# Patient Record
Sex: Female | Born: 1995 | Race: Black or African American | Hispanic: No | Marital: Single | Smoking: Light tobacco smoker
Health system: Southern US, Community
[De-identification: ages and names within clinical notes are randomized; demographics above are authoritative.]

## PROBLEM LIST (undated history)

## (undated) DIAGNOSIS — F419 Anxiety disorder, unspecified: Secondary | ICD-10-CM

---
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
## 2017-01-24 ENCOUNTER — Other Ambulatory Visit: Payer: Self-pay

## 2017-01-24 ENCOUNTER — Encounter (HOSPITAL_BASED_OUTPATIENT_CLINIC_OR_DEPARTMENT_OTHER): Payer: Self-pay | Admitting: *Deleted

## 2017-01-24 ENCOUNTER — Emergency Department (HOSPITAL_BASED_OUTPATIENT_CLINIC_OR_DEPARTMENT_OTHER): Payer: Self-pay

## 2017-01-24 ENCOUNTER — Emergency Department (HOSPITAL_BASED_OUTPATIENT_CLINIC_OR_DEPARTMENT_OTHER)
Admission: EM | Admit: 2017-01-24 | Discharge: 2017-01-25 | Disposition: A | Discharge status: Home / Self Care | Payer: Self-pay | Attending: Emergency Medicine | Admitting: Emergency Medicine

## 2017-01-24 DIAGNOSIS — Z79899 Other long term (current) drug therapy: Secondary | ICD-10-CM | POA: Insufficient documentation

## 2017-01-24 DIAGNOSIS — J45909 Unspecified asthma, uncomplicated: Secondary | ICD-10-CM | POA: Insufficient documentation

## 2017-01-24 DIAGNOSIS — Y929 Unspecified place or not applicable: Secondary | ICD-10-CM | POA: Insufficient documentation

## 2017-01-24 DIAGNOSIS — S5011XA Contusion of right forearm, initial encounter: Secondary | ICD-10-CM | POA: Insufficient documentation

## 2017-01-24 DIAGNOSIS — W010XXA Fall on same level from slipping, tripping and stumbling without subsequent striking against object, initial encounter: Secondary | ICD-10-CM | POA: Insufficient documentation

## 2017-01-24 DIAGNOSIS — Y999 Unspecified external cause status: Secondary | ICD-10-CM | POA: Insufficient documentation

## 2017-01-24 DIAGNOSIS — Y939 Activity, unspecified: Secondary | ICD-10-CM | POA: Insufficient documentation

## 2017-01-24 NOTE — ED Triage Notes (Signed)
She slipped on water and fell yesterday am. Injury to her right forearm.

## 2017-01-25 NOTE — Discharge Instructions (Signed)
Wear wrist splint as applied for the next several days for comfort.  Tylenol 1000 mg rotated with ibuprofen 600 mg every 4 hours as needed for pain.  MRI outpatient as scheduled.  Please follow-up these results with your primary doctor.

## 2017-01-25 NOTE — ED Provider Notes (Signed)
[facility] EMERGENCY DEPARTMENT Provider Note   CSN: [id number] Arrival date & time: 01/24/17  2254     History   Chief Complaint Chief Complaint  Patient presents with  . Arm Injury    HPI Renee [name] is a 21 y.o. female.  Patient is a 21 year old female presenting for evaluation of a fall.  She reports slipping on a wet floor 2 days ago injuring her right forearm.   The history is provided by the patient.  Arm Injury   This is a new problem. The current episode started 2 days ago. The problem occurs constantly. The problem has not changed since onset.The quality of the pain is described as sharp. The pain is moderate. Pertinent negatives include no numbness and no tingling. She has tried nothing for the symptoms.    Past Medical History:  Diagnosis Date  . Asthma     Patient Active Problem List   Diagnosis Date Noted  . Asthma with exacerbation 04/26/2014  . Heart palpitations 12/15/2013  . Blurred vision, bilateral 12/15/2013  . Decreased visual acuity 12/15/2013  . Generalized anxiety disorder 12/15/2013  . Tension headache 12/15/2013  . Encounter for sexually transmitted disease counseling 12/29/2012  . General counseling and advice on female contraception 12/29/2012  . Paresthesias 08/05/2012  . Tongue lesion 08/05/2012  . ASTHMA, PERSISTENT 06/22/2008  . CHILDHOOD OBESITY 08/27/2007  . H/O Kawasaki's disease 05/28/2007  . ALLERGIC RHINITIS 06/20/2006    History reviewed. No pertinent surgical history.  OB History    No data available       Home Medications    Prior to Admission medications   Medication Sig Start Date End Date Taking? Authorizing Provider  albuterol (PROAIR HFA) 108 (90 Base) MCG/ACT inhaler INHALE 2 PUFFS INTO THE LUNGS EVERY 4 (FOUR) HOURS AS NEEDED FOR WHEEZING OR SHORTNESS OF BREATH. 11/06/15   Rumley,  N, DO  b complex vitamins capsule Take 1 capsule by mouth daily. 08/04/12   [place] Peaches[place]az,  Donetta Pottsayarmys, MD  beclomethasone (QVAR) 40 MCG/ACT inhaler Inhale 2 puffs into the lungs 2 (two) times daily. 10/22/10 10/22/11  Shelva MajesticHunter, Stephen O, MD  cetirizine-pseudoephedrine (ZYRTEC-D ALLERGY & CONGESTION) 5-120 MG per tablet Take 1 tablet by mouth 2 (two) times daily. For allergies     [provider]  cyclobenzaprine (FLEXERIL) 5 MG tablet Take 1 tablet (5 mg total) by mouth 2 (two) times daily as needed for muscle spasms. 12/11/14   Danelle Berryapia, Leisa, PA-C  fexofenadine (ALLEGRA) 180 MG tablet Take 1 tablet (180 mg total) by mouth daily. 07/02/11 07/01/12  Floydene FlockNewton, Steven J, MD  fluticasone (FLONASE) 50 MCG/ACT nasal spray Place 1 spray into the nose 2 (two) times daily as needed for rhinitis. 2 sprays each nostril daily. 07/02/11   Floydene FlockNewton, Steven J, MD  guaiFENesin (MUCINEX) 600 MG 12 hr tablet Take 1 tablet (600 mg total) by mouth 2 (two) times daily. 03/03/12   Tommie Samsook, Jayce G, DO  ibuprofen (ADVIL,MOTRIN) 200 MG tablet Take 400 mg by mouth every 6 (six) hours as needed for headache, mild pain or moderate pain.    [provider]  ibuprofen (ADVIL,MOTRIN) 800 MG tablet Take 1 tablet (800 mg total) by mouth 3 (three) times daily. 12/11/14   Danelle Berryapia, Leisa, PA-C  predniSONE (DELTASONE) 20 MG tablet Take 2 tablets (40 mg total) by mouth daily with breakfast. 04/26/14   Elenora GammaBradshaw, Samuel L, MD  venlafaxine XR (EFFEXOR XR) 37.5 MG 24 hr capsule Take 37.5 mg  once a day for 7 days, then take 75 mg 12/15/13   Kuneff, Renee A, DO    Family History No family history on file.  Social History Social History   Tobacco Use  . Smoking status: Never Smoker  . Smokeless tobacco: Never Used  Substance Use Topics  . Alcohol use: No  . Drug use: No     Allergies   Patient has no known allergies.   Review of Systems Review of Systems  Neurological: Negative for tingling and numbness.  All other systems reviewed and are negative.    Physical Exam Updated Vital Signs BP 119/80   Pulse 100    Temp 98.4 F (36.9 C) (Oral)   Resp 18   Ht 5\' 2"  (1.575 m)   Wt 102.1 kg (225 lb)   LMP 01/01/2017   SpO2 100%   BMI 41.15 kg/m   Physical Exam  Constitutional: She is oriented to person, place, and time. She appears well-developed and well-nourished. No distress.  HENT:  Head: Normocephalic and atraumatic.  Neck: Normal range of motion. Neck supple.  Musculoskeletal:  The right forearm appears grossly normal.  There is no swelling or deformity.  There is tenderness over the mid and distal third of the forearm.  Ulnar and radial pulses are palpable.  She is able to flex and extend all fingers and wrist.  Capillary refill is brisk and sensation is intact throughout the entire hand.  Neurological: She is alert and oriented to person, place, and time.  Skin: Skin is warm and dry. She is not diaphoretic.  Nursing note and vitals reviewed.    ED Treatments / Results  Labs (all labs ordered are listed, but only abnormal results are displayed) Labs Reviewed - No data to display  EKG  EKG Interpretation None       Radiology Dg Forearm Right  Result Date: 01/24/2017 CLINICAL DATA:  Status post fall, with right distal forearm pain. EXAM: RIGHT FOREARM - 2 VIEW COMPARISON:  None. FINDINGS: There is a focal cortical defect along the radial aspect of the distal radial diaphysis, with underlying lucency along the inner table of the cortex. This is of uncertain significance, and raises concern for an underlying lesion. There is no evidence of fracture or dislocation. No significant soft tissue abnormalities are characterized on radiograph. The carpal rows appear grossly intact, and demonstrate normal alignment. The elbow joint is unremarkable. No elbow joint effusion is identified. IMPRESSION: 1. No evidence of fracture or dislocation. 2. Focal cortical defect along the radial aspect of the distal radial diaphysis, with underlying lucency along the anterior cortex. This is of uncertain  significance, and raises concern for underlying lesion. MRI of the right distal forearm is recommended for further evaluation. Electronically Signed   By: Roanna Raider M.D.   On: 01/24/2017 23:59    Procedures Procedures (including critical care time)  Medications Ordered in ED Medications - No data to display   Initial Impression / Assessment and Plan / ED Course  I have reviewed the triage vital signs and the nursing notes.  Pertinent labs & imaging results that were available during my care of the patient were reviewed by me and considered in my medical decision making (see chart for details).  Patient will be placed in a splint and treated with ibuprofen and Tylenol.  This appears to be a contusion with x-rays negative for fracture.  Her x-rays do reveal a bone lesion, the etiology of which I am uncertain.  Radiology is recommending an MRI be performed to further investigate.  This will be scheduled as an outpatient and followed up with her primary doctor.  Final Clinical Impressions(s) / ED Diagnoses   Final diagnoses:  None    ED Discharge Orders        Ordered    MR FOREARM RIGHT WO CONTRAST     01/25/17 0013       Geoffery Lyonselo, Anjana Cheek, MD 01/25/17 [id number]

## 2017-01-30 ENCOUNTER — Ambulatory Visit (HOSPITAL_COMMUNITY)
Admission: RE | Admit: 2017-01-30 | Discharge: 2017-01-30 | Disposition: A | Discharge status: Home / Self Care | Payer: Self-pay | Source: Ambulatory Visit | Attending: Emergency Medicine | Admitting: Emergency Medicine

## 2017-01-30 DIAGNOSIS — M79631 Pain in right forearm: Secondary | ICD-10-CM | POA: Insufficient documentation

## 2017-01-31 ENCOUNTER — Ambulatory Visit (HOSPITAL_COMMUNITY): Payer: Self-pay

## 2017-02-05 ENCOUNTER — Telehealth: Payer: Self-pay | Admitting: *Deleted

## 2017-02-05 NOTE — Telephone Encounter (Signed)
Received request for Medical Records from [facility]; forwarded to Jordan for email/scan/SLS 01/09    

## 2017-08-04 ENCOUNTER — Ambulatory Visit: Payer: BC Managed Care – PPO | Admitting: Family Medicine

## 2017-08-20 ENCOUNTER — Emergency Department (HOSPITAL_COMMUNITY)
Admission: EM | Admit: 2017-08-20 | Discharge: 2017-08-20 | Discharge status: Against Medical Advice | Payer: BC Managed Care – PPO | Attending: Emergency Medicine | Admitting: Emergency Medicine

## 2017-08-20 ENCOUNTER — Inpatient Hospital Stay (HOSPITAL_COMMUNITY)
Admission: AD | Admit: 2017-08-20 | Payer: Federal, State, Local not specified - Other | Source: Intra-hospital | Admitting: Psychiatry

## 2017-08-20 ENCOUNTER — Encounter (HOSPITAL_COMMUNITY): Payer: Self-pay | Admitting: *Deleted

## 2017-08-20 DIAGNOSIS — Z79899 Other long term (current) drug therapy: Secondary | ICD-10-CM | POA: Insufficient documentation

## 2017-08-20 DIAGNOSIS — J45909 Unspecified asthma, uncomplicated: Secondary | ICD-10-CM | POA: Insufficient documentation

## 2017-08-20 DIAGNOSIS — F332 Major depressive disorder, recurrent severe without psychotic features: Secondary | ICD-10-CM | POA: Diagnosis present

## 2017-08-20 DIAGNOSIS — F329 Major depressive disorder, single episode, unspecified: Secondary | ICD-10-CM

## 2017-08-20 DIAGNOSIS — F32A Depression, unspecified: Secondary | ICD-10-CM

## 2017-08-20 HISTORY — DX: Anxiety disorder, unspecified: F41.9

## 2017-08-20 LAB — COMPREHENSIVE METABOLIC PANEL
ALK PHOS: 87 U/L (ref 38–126)
ALT: 12 U/L (ref 0–44)
AST: 15 U/L (ref 15–41)
Albumin: 3.6 g/dL (ref 3.5–5.0)
Anion gap: 6 (ref 5–15)
BILIRUBIN TOTAL: 0.5 mg/dL (ref 0.3–1.2)
BUN: 10 mg/dL (ref 6–20)
CALCIUM: 9 mg/dL (ref 8.9–10.3)
CO2: 26 mmol/L (ref 22–32)
Chloride: 110 mmol/L (ref 98–111)
Creatinine, Ser: 0.67 mg/dL (ref 0.44–1.00)
GFR calc non Af Amer: >60 mL/min (ref >60)
Glucose, Bld: 121 mg/dL — ABNORMAL HIGH (ref 70–99)
POTASSIUM: 4.4 mmol/L (ref 3.5–5.1)
SODIUM: 142 mmol/L (ref 135–145)
TOTAL PROTEIN: 8.1 g/dL (ref 6.5–8.1)

## 2017-08-20 LAB — CBC WITH DIFFERENTIAL/PLATELET
Basophils Absolute: 0 10*3/uL (ref 0.0–0.1)
Basophils Relative: 0 %
Eosinophils Absolute: 0.1 10*3/uL (ref 0.0–0.7)
Eosinophils Relative: 1 %
HEMATOCRIT: 37.8 % (ref 36.0–46.0)
Hemoglobin: 12.8 g/dL (ref 12.0–15.0)
LYMPHS PCT: 43 %
Lymphs Abs: 3.3 10*3/uL (ref 0.7–4.0)
MCH: 29.4 pg (ref 26.0–34.0)
MCHC: 33.9 g/dL (ref 30.0–36.0)
MCV: 86.9 fL (ref 78.0–100.0)
MONOS PCT: 5 %
Monocytes Absolute: 0.4 10*3/uL (ref 0.1–1.0)
Neutro Abs: 3.9 10*3/uL (ref 1.7–7.7)
Neutrophils Relative %: 51 %
Platelets: 337 10*3/uL (ref 150–400)
RBC: 4.35 MIL/uL (ref 3.87–5.11)
RDW: 12.2 % (ref 11.5–15.5)
WBC: 7.6 10*3/uL (ref 4.0–10.5)

## 2017-08-20 LAB — RAPID URINE DRUG SCREEN, HOSP PERFORMED
Amphetamines: NOT DETECTED
BENZODIAZEPINES: NOT DETECTED
Barbiturates: NOT DETECTED
Cocaine: NOT DETECTED
OPIATES: NOT DETECTED
Tetrahydrocannabinol: NOT DETECTED

## 2017-08-20 LAB — I-STAT BETA HCG BLOOD, ED (MC, WL, AP ONLY): I-stat hCG, quantitative: <5 m[IU]/mL (ref <5)

## 2017-08-20 LAB — TSH: TSH: 3.424 u[IU]/mL (ref 0.350–4.500)

## 2017-08-20 LAB — ETHANOL: Alcohol, Ethyl (B): <10 mg/dL (ref <10)

## 2017-08-20 NOTE — ED Triage Notes (Signed)
Pt reports depressed mood for ~1.5 years. Pt mother states the pt is talking about her depressed mood more lately. Pt's mother is concerned the pt will hurt herself, pt denies wanting to hurt herself.

## 2017-08-20 NOTE — ED Provider Notes (Signed)
[facility]-EMERGENCY DEPT Provider Note   CSN: [id number] Arrival date & time: 08/20/17  1124     History   Chief Complaint Chief Complaint  Patient presents with  . Depression    HPI Renee [name] is a 22 y.o. female.  She is brought in by her mom for increased depressive symptoms.  She has not had a true diagnosis of depression is not on any medications for it.  She was treated by her PCP for anxiety about 5 months ago but the medications gave her headaches.  She states she is under a lot of stress with school.  She finds her sleeping pattern is been very disrupted and she cannot seem to relax and does not take part of anything that used to interest her in the past.  She denies any suicidal ideation homicidal ideation or any hallucinations or delusions.  She has never tried to hurt herself in the past.  She denies any alcohol or substance use.  The history is provided by the patient and a parent.  Depression  This is a new problem. The problem occurs constantly. The problem has not changed since onset.Pertinent negatives include no chest pain, no abdominal pain, no headaches and no shortness of breath. Exacerbated by: stress. Nothing relieves the symptoms. She has tried nothing for the symptoms. The treatment provided no relief.    Past Medical History:  Diagnosis Date  . Asthma     Patient Active Problem List   Diagnosis Date Noted  . Asthma with exacerbation 04/26/2014  . Heart palpitations 12/15/2013  . Blurred vision, bilateral 12/15/2013  . Decreased visual acuity 12/15/2013  . Generalized anxiety disorder 12/15/2013  . Tension headache 12/15/2013  . Encounter for sexually transmitted disease counseling 12/29/2012  . General counseling and advice on female contraception 12/29/2012  . Paresthesias 08/05/2012  . Tongue lesion 08/05/2012  . ASTHMA, PERSISTENT 06/22/2008  . CHILDHOOD OBESITY 08/27/2007  . H/O Kawasaki's disease 05/28/2007  .  ALLERGIC RHINITIS 06/20/2006    History reviewed. No pertinent surgical history.   OB History   None      Home Medications    Prior to Admission medications   Medication Sig Start Date End Date Taking? Authorizing Provider  albuterol (PROAIR HFA) 108 (90 Base) MCG/ACT inhaler INHALE 2 PUFFS INTO THE LUNGS EVERY 4 (FOUR) HOURS AS NEEDED FOR WHEEZING OR SHORTNESS OF BREATH. 11/06/15   Rumley, Starbuck N, DO  b complex vitamins capsule Take 1 capsule by mouth daily. 08/04/12   [place] Cris[place]da PeachesLa Paz, Donetta Pottsayarmys, MD  beclomethasone (QVAR) 40 MCG/ACT inhaler Inhale 2 puffs into the lungs 2 (two) times daily. 10/22/10 10/22/11  Shelva MajesticHunter, Stephen O, MD  cetirizine-pseudoephedrine (ZYRTEC-D ALLERGY & CONGESTION) 5-120 MG per tablet Take 1 tablet by mouth 2 (two) times daily. For allergies     [provider]  cyclobenzaprine (FLEXERIL) 5 MG tablet Take 1 tablet (5 mg total) by mouth 2 (two) times daily as needed for muscle spasms. 12/11/14   Danelle Berryapia, Leisa, PA-C  fexofenadine (ALLEGRA) 180 MG tablet Take 1 tablet (180 mg total) by mouth daily. 07/02/11 07/01/12  Floydene FlockNewton, Steven J, MD  fluticasone (FLONASE) 50 MCG/ACT nasal spray Place 1 spray into the nose 2 (two) times daily as needed for rhinitis. 2 sprays each nostril daily. 07/02/11   Floydene FlockNewton, Steven J, MD  guaiFENesin (MUCINEX) 600 MG 12 hr tablet Take 1 tablet (600 mg total) by mouth 2 (two) times daily. 03/03/12   Tommie Samsook, Jayce G,  DO  ibuprofen (ADVIL,MOTRIN) 200 MG tablet Take 400 mg by mouth every 6 (six) hours as needed for headache, mild pain or moderate pain.    [provider]  ibuprofen (ADVIL,MOTRIN) 800 MG tablet Take 1 tablet (800 mg total) by mouth 3 (three) times daily. 12/11/14   Danelle Berry, PA-C  predniSONE (DELTASONE) 20 MG tablet Take 2 tablets (40 mg total) by mouth daily with breakfast. 04/26/14   Elenora Gamma, MD  venlafaxine XR (EFFEXOR XR) 37.5 MG 24 hr capsule Take 37.5 mg once a day for 7 days, then take 75 mg 12/15/13    Kuneff, Renee A, DO    Family History No family history on file.  Social History Social History   Tobacco Use  . Smoking status: Never Smoker  . Smokeless tobacco: Never Used  Substance Use Topics  . Alcohol use: No  . Drug use: No     Allergies   Patient has no known allergies.   Review of Systems Review of Systems  Constitutional: Negative for fever.  HENT: Negative for sore throat.   Eyes: Negative for visual disturbance.  Respiratory: Negative for shortness of breath.   Cardiovascular: Negative for chest pain.  Gastrointestinal: Negative for abdominal pain.  Genitourinary: Negative for dysuria.  Skin: Negative for rash.  Neurological: Negative for headaches.  Psychiatric/Behavioral: Positive for depression and dysphoric mood. Negative for self-injury and suicidal ideas.     Physical Exam Updated Vital Signs BP 108/71 (BP Location: Left Arm)   Pulse 89   Temp 98.3 F (36.8 C) (Oral)   Resp 18   LMP 07/21/2017   SpO2 97%   Physical Exam  Constitutional: She appears well-developed and well-nourished.  HENT:  Head: Normocephalic and atraumatic.  Eyes: Conjunctivae are normal.  Neck: Neck supple.  Cardiovascular: Normal rate and regular rhythm.  Pulmonary/Chest: Effort normal. No stridor. She has no wheezes. She has no rales.  Abdominal: Soft. There is no tenderness. There is no guarding.  Musculoskeletal: Normal range of motion.  Neurological: She is alert. GCS eye subscore is 4. GCS verbal subscore is 5. GCS motor subscore is 6.  Skin: Skin is warm and dry.  Psychiatric: Her speech is normal and behavior is normal. Thought content normal. She exhibits a depressed mood.  Nursing note and vitals reviewed.    ED Treatments / Results  Labs (all labs ordered are listed, but only abnormal results are displayed) Labs Reviewed  COMPREHENSIVE METABOLIC PANEL - Abnormal; Notable for the following components:      Result Value   Glucose, Bld 121 (*)    All  other components within normal limits  ETHANOL  RAPID URINE DRUG SCREEN, HOSP PERFORMED  CBC WITH DIFFERENTIAL/PLATELET  TSH  I-STAT BETA HCG BLOOD, ED ([facility], [facility], [facility] ONLY)    EKG None  Radiology No results found.  Procedures Procedures (including critical care time)  Medications Ordered in ED Medications - No data to display   Initial Impression / Assessment and Plan / ED Course  I have reviewed the triage vital signs and the nursing notes.  Pertinent labs & imaging results that were available during my care of the patient were reviewed by me and considered in my medical decision making (see chart for details).  Clinical Course as of Aug 21 1732  Wed Aug 20, 2017  1508 Patient has been evaluated and no acute medical condition identified on a screening exam.  Discussed with behavioral health who plans on admitting her to behavioral  health when a bed is available at 8 PM.   [MB]  1529 Informed by nurse that the patient may have eloped with her family.   [MB]  1537 Palpation at number listed for home phone and there was no answer and no answering machine.  Attempted to reach mother with contact number listed in demographics and the person who answered said he has been getting phone calls for her for 3 years and this is not her number.   [MB]  1617 I was asked by behavioral health if I thought the patient would be considered for an IVC.  During my interaction with the patient and the mom there was no suicidal ideation and so I do not feel an IVC would be appropriate.  We are still trying to reach the patient to see if she would return to the hospital.   [MB]    Clinical Course User Index [MB] Terrilee Files, MD    Final Clinical Impressions(s) / ED Diagnoses   Final diagnoses:  Depression, unspecified depression type  Severe episode of recurrent major depressive disorder, without psychotic features [facility])    ED Discharge Orders    None       Terrilee Files,  MD 08/21/17 1735

## 2017-08-20 NOTE — ED Notes (Signed)
Bed: [facility]PT3 Expected date:  Expected time:  Means of arrival:  Comments: 

## 2017-08-20 NOTE — ED Notes (Signed)
Pt was not in room, [facility].

## 2017-08-20 NOTE — ED Notes (Signed)
Lab draw unsuccessful. RN made aware, TTS at bedside.

## 2017-08-20 NOTE — BH Assessment (Signed)
Assessment Note  Renee [name] is a 22 y.o. female who presented to [facility] on voluntary basis with complaint of depressed mood, passive suicidal ideation, and other depressive symptoms.  Pt was accompanied by her brother and mother.  Pt lives in [place] with her mother.  Per report, she attends college and also works.  She has never received any outpatient or inpatient psychiatric/therapy care.  Pt has not been assessed by TTS before.  History was provided by Pt and Pt's mother.  Pt reported that she has experienced the following symptoms over the last two months:  Passive suicidal ideation (no Despondency; poor sleep (5 hours per night); lowered appetite (lost 15 lbs over two months); feelings of worthlessness and hopelessness; fatigue.  Pt also endorsed recent stressor -- she recently lost a job and may lose another because she is lethargic and avolitional.  Pt denied past suicide attempt, homicidal ideation, hallucination, self-injurious behavior, and substance use.  Pt appeared apathetic and lethargic.  Her responses were ambiguous.  Chartered loss adjuster spoke with mother separately.  Mother reported that she is increasingly concerned about Pt because Pt cut her wrist last week (superficial cut, no sutures/stitches required), because Pt is endorsing passive suicidal ideation, and because Pt appears to be responding to internal stimuli.  Mother requested inpatient treatment or an overnight observation.  Pt denied trauma, reported that she felt safe at home, and denied current stressors.  ''I don't know... I don't feel good or like doing anything."   During assessment, Pt presented as alert and oriented.  Pt was dressed in street clothes, and she appeared appropriately groomed.  Pt's demeanor was guarded.  Pt's mood was sad.  Affect was blunted.  Pt endorsed passive suicidal ideation, despondency, and other depressive symptoms.  Pt's mother reported that Pt engages in self harm and responds to internal stimuli.   Pt's speech was soft.  Thought processes were within normal range.  There was no evidence of delusion.  There was no evidence of hallucination or substance use.  Pt's insight, judgment, and impulse control was fair.  Consulted with Molli Knock, NP, who determined that Pt meets inpatient criteria.   Diagnosis: F32. Major Depressive Disorder, Single Ep., Severe w/o psychotic features  Past Medical History:  Past Medical History:  Diagnosis Date  . Anxiety   . Asthma     History reviewed. No pertinent surgical history.  Family History: No family history on file.  Social History:  reports that she has never smoked. She has never used smokeless tobacco. She reports that she does not drink alcohol or use drugs.  Additional Social History:  Alcohol / Drug Use Pain Medications: See MAR Prescriptions: See MAR Over the Counter: See MAR History of alcohol / drug use?: No history of alcohol / drug abuse  CIWA: CIWA-Ar BP: 108/71 Pulse Rate: 89 COWS:    Allergies: No Known Allergies  Home Medications:  (Not in a hospital admission)  OB/GYN Status:  Patient's last menstrual period was 07/21/2017.  General Assessment Data TTS Assessment: In system Is this a Tele or Face-to-Face Assessment?: Face-to-Face Is this an Initial Assessment or a Re-assessment for this encounter?: Initial Assessment Marital status: Single Maiden name: Gren Is patient pregnant?: No Pregnancy Status: No Living Arrangements: Parent(Lives iwth mother) Can pt return to current living arrangement?: Yes Admission Status: Voluntary Is patient capable of signing voluntary admission?: Yes Referral Source: Self/Family/Friend Insurance type: BCBS     Crisis Care Plan Living Arrangements: Parent(Lives iwth mother) Name of Psychiatrist:  None Name of Therapist: None  Education Status Is patient currently in school?: Yes Current Grade: Some college  Risk to self with the past 6 months Suicidal Ideation:  No(See notes -- some passive SI) Has patient been a risk to self within the past 6 months prior to admission? : No Suicidal Intent: No Has patient had any suicidal intent within the past 6 months prior to admission? : No Is patient at risk for suicide?: No(See notes) Suicidal Plan?: No Has patient had any suicidal plan within the past 6 months prior to admission? : No Access to Means: No What has been your use of drugs/alcohol within the last 12 months?: Pt denied Previous Attempts/Gestures: No Intentional Self Injurious Behavior: Cutting(Per mother, seee notes) Comment - Self Injurious Behavior: Cut on wrist from last week Family Suicide History: No Recent stressful life event(s): Job Loss Persecutory voices/beliefs?: No Depression: Yes Depression Symptoms: Despondent, Insomnia, Isolating, Feeling worthless/self pity, Loss of interest in usual pleasures Substance abuse history and/or treatment for substance abuse?: No Suicide prevention information given to non-admitted patients: Not applicable  Risk to Others within the past 6 months Homicidal Ideation: No Does patient have any lifetime risk of violence toward others beyond the six months prior to admission? : No Thoughts of Harm to Others: No Current Homicidal Intent: No Current Homicidal Plan: No Access to Homicidal Means: No History of harm to others?: No Assessment of Violence: None Noted Does patient have access to weapons?: No Criminal Charges Pending?: No Does patient have a court date: No Is patient on probation?: No  Psychosis Hallucinations: None noted(Pt denied, but see  notes) Delusions: None noted  Mental Status Report Appearance/Hygiene: In scrubs, Other (Comment)(Street clothes) Eye Contact: Fair Motor Activity: Freedom of movement, Unremarkable Speech: Logical/coherent, Soft Level of Consciousness: Alert Mood: Sad Affect: Blunted Anxiety Level: None Thought Processes: Relevant, Coherent Judgement:  Partial Orientation: Person, Place, Time, Situation Obsessive Compulsive Thoughts/Behaviors: None  Cognitive Functioning Concentration: Normal Memory: Recent Intact, Remote Intact Is patient IDD: No Is patient DD?: No Insight: Fair Impulse Control: Fair Appetite: Fair Have you had any weight changes? : Loss Amount of the weight change? (lbs): 15 lbs(Over two month period) Sleep: Decreased Total Hours of Sleep: 5 Vegetative Symptoms: None  ADLScreening [facility] Assessment Services) Patient's cognitive ability adequate to safely complete daily activities?: Yes Patient able to express need for assistance with ADLs?: Yes Independently performs ADLs?: Yes (appropriate for developmental age)  Prior Inpatient Therapy Prior Inpatient Therapy: No  Prior Outpatient Therapy Prior Outpatient Therapy: No Does patient have an ACCT team?: No Does patient have Intensive In-House Services?  : No Does patient have [facility] services? : No Does patient have P4CC services?: No  ADL Screening (condition at time of admission) Patient's cognitive ability adequate to safely complete daily activities?: Yes Is the patient deaf or have difficulty hearing?: No Does the patient have difficulty seeing, even when wearing glasses/contacts?: No Does the patient have difficulty concentrating, remembering, or making decisions?: No Patient able to express need for assistance with ADLs?: Yes Does the patient have difficulty dressing or bathing?: No Independently performs ADLs?: Yes (appropriate for developmental age) Does the patient have difficulty walking or climbing stairs?: No Weakness of Legs: None Weakness of Arms/Hands: None  Home Assistive Devices/Equipment Home Assistive Devices/Equipment: None  Therapy Consults (therapy consults require a physician order) PT Evaluation Needed: No OT Evalulation Needed: No SLP Evaluation Needed: No Abuse/Neglect Assessment (Assessment to be complete while patient  is alone) Abuse/Neglect Assessment  Can Be Completed: Yes Physical Abuse: Denies Verbal Abuse: Denies Sexual Abuse: Denies Exploitation of patient/patient's resources: Denies Self-Neglect: Denies Values / Beliefs Cultural Requests During Hospitalization: None Spiritual Requests During Hospitalization: None Consults Spiritual Care Consult Needed: No Social Work Consult Needed: No Merchant navy officerAdvance Directives (For Healthcare) Does Patient Have a Medical Advance Directive?: No Would patient like information on creating a medical advance directive?: No - Patient declined    Additional Information 1:1 In Past 12 Months?: No CIRT Risk: No Elopement Risk: No Does patient have medical clearance?: Yes     Disposition:  Disposition Initial Assessment Completed for this Encounter: Yes Disposition of Patient: Admit Type of inpatient treatment program: Adult(Per Molli KnockJ. Lord, NP, Pt meets inpt criteria)  On Site Evaluation by:   Reviewed with Physician:    Dorris FetchEugene T Greysyn Vanderberg 08/20/2017 2:25 PM

## 2017-08-20 NOTE — ED Notes (Signed)
Bed: [facility]4 Expected date:  Expected time:  Means of arrival:  Comments: 

## 2017-08-21 ENCOUNTER — Ambulatory Visit (INDEPENDENT_AMBULATORY_CARE_PROVIDER_SITE_OTHER): Payer: Self-pay | Admitting: Family Medicine

## 2017-08-21 ENCOUNTER — Encounter: Payer: Self-pay | Admitting: Family Medicine

## 2017-08-21 DIAGNOSIS — F329 Major depressive disorder, single episode, unspecified: Secondary | ICD-10-CM

## 2017-08-21 DIAGNOSIS — F32A Depression, unspecified: Secondary | ICD-10-CM

## 2017-08-21 DIAGNOSIS — F332 Major depressive disorder, recurrent severe without psychotic features: Secondary | ICD-10-CM

## 2017-08-21 MED ORDER — CITALOPRAM HYDROBROMIDE 20 MG PO TABS: 20.0000 mg | ORAL_TABLET | Freq: Every day | ORAL | 3 refills | Status: DC

## 2017-08-21 NOTE — Patient Instructions (Signed)
See me in 3 -4 weeks to recheck to recheck. I will introduce you to one of our counselors. Remember that exercise and getting out with friends can help the depression go away more quickly.

## 2017-08-22 ENCOUNTER — Encounter: Payer: Self-pay | Admitting: Family Medicine

## 2017-08-22 NOTE — Assessment & Plan Note (Signed)
Did not want counseling today but I did make a warm introduction to integrated care and encouraged counseling.  Start celexa.  FU 3-4 weeks with me.  To integrated care sooner.

## 2017-08-22 NOTE — Progress Notes (Signed)
   Subjective:    Patient ID: Renee [name], female    DOB: 03/03/1995, 22 y.o.   MRN: [id number]  HPI Patient comes in with her mom to discuss significant depression.  Present for 2 months.  No obvious precipitating cause.  States this is the first episode (but has been on venlafexamine in the past for anxiety.)  No SI or HI.  Classic vegetative signs with difficulty sleeping then fatigue during the day.  Anhedonia - does not want to do things she previously found to be fun.  Increased appetite.    Family Hx is somewhat positive.  Maternal grandmother had vague "nerve problems"  Mother does not have depression.     Review of Systems     Objective:   Physical Exam  Engages nicely in the office - mom states this is unusual.  Per mom and patient, this may be because they know me from treating her previous Kawasaki's disease.  Duration of visit was 25 minutes  >50 % counseling.        Assessment & Plan:

## 2017-08-27 ENCOUNTER — Other Ambulatory Visit: Payer: Self-pay

## 2017-08-27 ENCOUNTER — Telehealth: Payer: Self-pay | Admitting: Licensed Clinical Social Worker

## 2017-08-27 ENCOUNTER — Emergency Department (HOSPITAL_COMMUNITY)
Admission: EM | Admit: 2017-08-27 | Discharge: 2017-08-28 | Disposition: A | Discharge status: Other Acute Inpatient Hospital | Payer: Self-pay | Attending: Emergency Medicine | Admitting: Emergency Medicine

## 2017-08-27 ENCOUNTER — Encounter (HOSPITAL_COMMUNITY): Payer: Self-pay | Admitting: *Deleted

## 2017-08-27 ENCOUNTER — Ambulatory Visit: Payer: Self-pay

## 2017-08-27 DIAGNOSIS — F333 Major depressive disorder, recurrent, severe with psychotic symptoms: Secondary | ICD-10-CM | POA: Insufficient documentation

## 2017-08-27 DIAGNOSIS — Z046 Encounter for general psychiatric examination, requested by authority: Secondary | ICD-10-CM

## 2017-08-27 DIAGNOSIS — J45909 Unspecified asthma, uncomplicated: Secondary | ICD-10-CM | POA: Insufficient documentation

## 2017-08-27 DIAGNOSIS — F259 Schizoaffective disorder, unspecified: Secondary | ICD-10-CM | POA: Diagnosis present

## 2017-08-27 LAB — COMPREHENSIVE METABOLIC PANEL
ALK PHOS: 97 U/L (ref 38–126)
ALT: 13 U/L (ref 0–44)
AST: 22 U/L (ref 15–41)
Albumin: 3.9 g/dL (ref 3.5–5.0)
Anion gap: 11 (ref 5–15)
BUN: 9 mg/dL (ref 6–20)
CALCIUM: 9.2 mg/dL (ref 8.9–10.3)
CO2: 23 mmol/L (ref 22–32)
CREATININE: 0.76 mg/dL (ref 0.44–1.00)
Chloride: 108 mmol/L (ref 98–111)
Glucose, Bld: 105 mg/dL — ABNORMAL HIGH (ref 70–99)
Potassium: 3.3 mmol/L — ABNORMAL LOW (ref 3.5–5.1)
Sodium: 142 mmol/L (ref 135–145)
Total Bilirubin: 0.5 mg/dL (ref 0.3–1.2)
Total Protein: 8.4 g/dL — ABNORMAL HIGH (ref 6.5–8.1)

## 2017-08-27 LAB — I-STAT BETA HCG BLOOD, ED (MC, WL, AP ONLY): I-stat hCG, quantitative: <5 m[IU]/mL (ref <5)

## 2017-08-27 LAB — CBC
HCT: 39.3 % (ref 36.0–46.0)
Hemoglobin: 13.7 g/dL (ref 12.0–15.0)
MCH: 29.5 pg (ref 26.0–34.0)
MCHC: 34.9 g/dL (ref 30.0–36.0)
MCV: 84.5 fL (ref 78.0–100.0)
PLATELETS: 386 10*3/uL (ref 150–400)
RBC: 4.65 MIL/uL (ref 3.87–5.11)
RDW: 12.1 % (ref 11.5–15.5)
WBC: 7.9 10*3/uL (ref 4.0–10.5)

## 2017-08-27 LAB — ETHANOL: Alcohol, Ethyl (B): <10 mg/dL (ref <10)

## 2017-08-27 MED ORDER — HYDROXYZINE HCL 25 MG PO TABS: 50.0000 mg | ORAL_TABLET | Freq: Four times a day (QID) | ORAL | Status: DC | PRN

## 2017-08-27 MED ORDER — HALOPERIDOL 5 MG PO TABS
5.0000 mg | ORAL_TABLET | Freq: Four times a day (QID) | ORAL | Status: DC | PRN
  Filled 2017-08-27: qty 1

## 2017-08-27 MED ORDER — HALOPERIDOL LACTATE 5 MG/ML IJ SOLN: 5.0000 mg | Freq: Four times a day (QID) | INTRAMUSCULAR | Status: DC | PRN

## 2017-08-27 MED ORDER — LORAZEPAM 1 MG PO TABS: 2.0000 mg | ORAL_TABLET | Freq: Four times a day (QID) | ORAL | Status: DC | PRN

## 2017-08-27 MED ORDER — TRAZODONE HCL 50 MG PO TABS: 50.0000 mg | ORAL_TABLET | Freq: Every evening | ORAL | Status: DC | PRN

## 2017-08-27 MED ORDER — DIPHENHYDRAMINE HCL 50 MG/ML IJ SOLN: 50.0000 mg | Freq: Four times a day (QID) | INTRAMUSCULAR | Status: DC | PRN

## 2017-08-27 MED ORDER — LORAZEPAM 2 MG/ML IJ SOLN: 2.0000 mg | Freq: Four times a day (QID) | INTRAMUSCULAR | Status: DC | PRN
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
## 2017-08-27 NOTE — ED Notes (Signed)
Belongings placed in bag. Pt has been wanded by security. Security escorted pt to room 37 with belongings.
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
## 2017-08-27 NOTE — ED Notes (Signed)
Bed: [facility]4 Expected date:  Expected time:  Means of arrival:  Comments: 

## 2017-08-27 NOTE — ED Triage Notes (Signed)
Pt brought in escorted by [employer] and presents with IVC papers.  Per the IVC papers, pt has hx of depression and has been non-compliant with medications. PT has been having hallucinations and talking to two different people as witnessed by family.  Pt appears to be responding to the hallucination's commands.  [employer] reports that pt has been cooperative with them and denies SI/HI.

## 2017-08-28 ENCOUNTER — Encounter (HOSPITAL_COMMUNITY): Payer: Self-pay

## 2017-08-28 ENCOUNTER — Inpatient Hospital Stay (HOSPITAL_COMMUNITY)
Admission: AD | Admit: 2017-08-28 | Discharge: 2017-09-03 | DRG: 885 | Disposition: A | Discharge status: Home / Self Care | Payer: Federal, State, Local not specified - Other | Source: Intra-hospital | Attending: Psychiatry | Admitting: Psychiatry

## 2017-08-28 DIAGNOSIS — R45851 Suicidal ideations: Secondary | ICD-10-CM | POA: Diagnosis present

## 2017-08-28 DIAGNOSIS — F12159 Cannabis abuse with psychotic disorder, unspecified: Secondary | ICD-10-CM | POA: Diagnosis present

## 2017-08-28 DIAGNOSIS — G4700 Insomnia, unspecified: Secondary | ICD-10-CM | POA: Diagnosis present

## 2017-08-28 DIAGNOSIS — F19959 Other psychoactive substance use, unspecified with psychoactive substance-induced psychotic disorder, unspecified: Secondary | ICD-10-CM

## 2017-08-28 DIAGNOSIS — F1210 Cannabis abuse, uncomplicated: Secondary | ICD-10-CM

## 2017-08-28 DIAGNOSIS — G4710 Hypersomnia, unspecified: Secondary | ICD-10-CM | POA: Diagnosis present

## 2017-08-28 DIAGNOSIS — R634 Abnormal weight loss: Secondary | ICD-10-CM | POA: Diagnosis present

## 2017-08-28 DIAGNOSIS — F419 Anxiety disorder, unspecified: Secondary | ICD-10-CM | POA: Diagnosis present

## 2017-08-28 DIAGNOSIS — F333 Major depressive disorder, recurrent, severe with psychotic symptoms: Principal | ICD-10-CM | POA: Diagnosis present

## 2017-08-28 DIAGNOSIS — R41843 Psychomotor deficit: Secondary | ICD-10-CM | POA: Diagnosis present

## 2017-08-28 DIAGNOSIS — Z915 Personal history of self-harm: Secondary | ICD-10-CM

## 2017-08-28 DIAGNOSIS — Z79899 Other long term (current) drug therapy: Secondary | ICD-10-CM | POA: Diagnosis not present

## 2017-08-28 DIAGNOSIS — F259 Schizoaffective disorder, unspecified: Secondary | ICD-10-CM | POA: Diagnosis present

## 2017-08-28 DIAGNOSIS — Z8739 Personal history of other diseases of the musculoskeletal system and connective tissue: Secondary | ICD-10-CM

## 2017-08-28 DIAGNOSIS — R4584 Anhedonia: Secondary | ICD-10-CM | POA: Diagnosis present

## 2017-08-28 DIAGNOSIS — J45909 Unspecified asthma, uncomplicated: Secondary | ICD-10-CM | POA: Diagnosis present

## 2017-08-28 DIAGNOSIS — Z046 Encounter for general psychiatric examination, requested by authority: Secondary | ICD-10-CM

## 2017-08-28 LAB — RAPID URINE DRUG SCREEN, HOSP PERFORMED
Amphetamines: NOT DETECTED
BARBITURATES: NOT DETECTED
BENZODIAZEPINES: NOT DETECTED
COCAINE: NOT DETECTED
Opiates: NOT DETECTED
TETRAHYDROCANNABINOL: POSITIVE — AB

## 2017-08-28 MED ORDER — MAGNESIUM HYDROXIDE 400 MG/5ML PO SUSP: 30.0000 mL | Freq: Every day | ORAL | Status: DC | PRN

## 2017-08-28 MED ORDER — ALUM & MAG HYDROXIDE-SIMETH 200-200-20 MG/5ML PO SUSP: 30.0000 mL | ORAL | Status: DC | PRN

## 2017-08-28 MED ORDER — ACETAMINOPHEN 325 MG PO TABS
650.0000 mg | ORAL_TABLET | Freq: Four times a day (QID) | ORAL | Status: DC | PRN
  Administered 2017-08-29: 650 mg via ORAL
  Filled 2017-08-28: qty 2

## 2017-08-28 MED ORDER — CITALOPRAM HYDROBROMIDE 20 MG PO TABS
20.0000 mg | ORAL_TABLET | Freq: Every day | ORAL | Status: DC
  Administered 2017-08-29 – 2017-08-30 (×2): 20 mg via ORAL
  Filled 2017-08-28 (×3): qty 1

## 2017-08-28 MED ORDER — ALBUTEROL SULFATE HFA 108 (90 BASE) MCG/ACT IN AERS
2.0000 | INHALATION_SPRAY | Freq: Four times a day (QID) | RESPIRATORY_TRACT | Status: DC
  Administered 2017-08-29 – 2017-09-01 (×4): 2 via RESPIRATORY_TRACT
  Filled 2017-08-28 (×2): qty 6.7

## 2017-08-28 MED ORDER — HYDROXYZINE HCL 50 MG PO TABS
50.0000 mg | ORAL_TABLET | Freq: Four times a day (QID) | ORAL | Status: DC | PRN
  Administered 2017-08-28 – 2017-08-29 (×2): 50 mg via ORAL
  Filled 2017-08-28 (×2): qty 1
  Filled 2017-08-28: qty 10

## 2017-08-28 MED ORDER — TRAZODONE HCL 50 MG PO TABS
50.0000 mg | ORAL_TABLET | Freq: Every evening | ORAL | Status: DC | PRN
  Administered 2017-08-28 – 2017-08-29 (×2): 50 mg via ORAL
  Filled 2017-08-28 (×2): qty 1

## 2017-08-28 MED ORDER — LORAZEPAM 1 MG PO TABS: 2.0000 mg | ORAL_TABLET | Freq: Four times a day (QID) | ORAL | Status: DC | PRN

## 2017-08-28 MED ORDER — HALOPERIDOL LACTATE 5 MG/ML IJ SOLN: 5.0000 mg | Freq: Four times a day (QID) | INTRAMUSCULAR | Status: DC | PRN

## 2017-08-28 MED ORDER — LORAZEPAM 2 MG/ML IJ SOLN: 2.0000 mg | Freq: Four times a day (QID) | INTRAMUSCULAR | Status: DC | PRN

## 2017-08-28 NOTE — ED Notes (Signed)
Pt observed talking to self in hallway and in the bathroom. RN notified.

## 2017-08-28 NOTE — Consult Note (Addendum)
[facility] Consult   Reason for Consult:  Aggressive behavior Referring Physician:  EDP Patient Identification: Renee [name] MRN:  [id number] Principal Diagnosis: Schizoaffective disorder [facility]) Diagnosis:   Patient Active Problem List   Diagnosis Date Noted  . Involuntary commitment [Z04.6]   . Schizoaffective disorder ([place]) [F25.9]   . Depression [F32.9] 08/21/2017  . Severe episode of recurrent major depressive disorder, without psychotic features ([place]) [F33.2] 08/20/2017  . Asthma with exacerbation [J45.901] 04/26/2014  . Heart palpitations [R00.2] 12/15/2013  . Blurred vision, bilateral [H53.8] 12/15/2013  . Decreased visual acuity [H54.7] 12/15/2013  . Generalized anxiety disorder [F41.1] 12/15/2013  . Tension headache [G44.209] 12/15/2013  . Encounter for sexually transmitted disease counseling [Z70.8] 12/29/2012  . General counseling and advice on female contraception [Z30.09] 12/29/2012  . Paresthesias [R20.2] 08/05/2012  . Tongue lesion [K14.8] 08/05/2012  . ASTHMA, PERSISTENT [J45.909] 06/22/2008  . CHILDHOOD OBESITY [E66.9] 08/27/2007  . 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 ALLERGIC RHINITIS [J30.9] 06/20/2006    Total Time spent with patient: 45 minutes  Subjective:   Renee [name] is a 22 y.o. female patient admitted with hallucinations and aggressive behavior.  HPI:  Pt was seen and chart reviewed with treatment team and Dr Mariea Clonts. Pt denies suicidal/homicidal ideation, denies auditory/visual hallucinations but does appear to be responding to internal stimuli. Pt stated she doesn't know why her mother took out IVC papers out on her and that her mother is just trying to block her father. Pt had no reliable resource that we could speak with to verify that she was not acting aggressive and that she was not trying to access weapons in the trunk of her mother's car. Pt stated she was working at [employer] and [employer] but quit due to  being too stressful but she stated she wanted to attend college to study biology and work in a lab somewhere. Pt's UDS positive for THC and BAL negative. Pt was in the ED on 08-20-17 for a similar presentation and a bed was offered at [facility]. When TTS went to inform Pt that she would be going to the hospital, she had left with her family. At that time the EDP did not feel it was warranted to find Pt and have her IVC'd. Pt's labwork is unremarkable, K+ is at 3.3 which is barely below normal. No other diagnostic tests were ordered during this admission. Pt would benefit form an inpatient psychiatric admission for crisis stabilization and medication management.   Past Psychiatric History: As above  Risk to Self: Suicidal Ideation: No(Pt denies. ) Suicidal Intent: No Is patient at risk for suicide?: No Suicidal Plan?: No(Pt denies. ) Access to Means: No What has been your use of drugs/alcohol within the last 12 months?: Marijuana. UDS is pending.  How many times?: 0 Other Self Harm Risks: Pt denies.  Triggers for Past Attempts: None known Intentional Self Injurious Behavior: None Comment - Self Injurious Behavior: NA Risk to Others: Homicidal Ideation: No(Pt denies. ) Thoughts of Harm to Others: No(Pt denies. ) Current Homicidal Intent: No(Pt denies. ) Current Homicidal Plan: No Access to Homicidal Means: No Identified Victim: NA History of harm to others?: No Assessment of Violence: None Noted Violent Behavior Description: NA Does patient have access to weapons?: No(Pt denies. ) Criminal Charges Pending?: No Does patient have a court date: No Prior Inpatient Therapy:   Prior Outpatient Therapy: Prior Outpatient Therapy: No Does patient have an ACCT team?: No Does patient have Intensive In-House Services?  :  No Does patient have [facility] services? : No Does patient have P4CC services?: No  Past Medical History:  Past Medical History:  Diagnosis Date  . Anxiety   . Asthma    History  reviewed. No pertinent surgical history. Family History: No family history on file. Family Psychiatric  History: Unknown Social History:  Social History   Substance and Sexual Activity  Alcohol Use No     Social History   Substance and Sexual Activity  Drug Use No    Social History   Socioeconomic History  . Marital status: Single    Spouse name: Not on file  . Number of children: Not on file  . Years of education: Not on file  . Highest education level: Not on file  Occupational History  . Not on file  Social Needs  . Financial resource strain: Not on file  . Food insecurity:    Worry: Not on file    Inability: Not on file  . Transportation needs:    Medical: Not on file    Non-medical: Not on file  Tobacco Use  . Smoking status: Never Smoker  . Smokeless tobacco: Never Used  Substance and Sexual Activity  . Alcohol use: No  . Drug use: No  . Sexual activity: Never  Lifestyle  . Physical activity:    Days per week: Not on file    Minutes per session: Not on file  . Stress: Not on file  Relationships  . Social connections:    Talks on phone: Not on file    Gets together: Not on file    Attends religious service: Not on file    Active member of club or organization: Not on file    Attends meetings of clubs or organizations: Not on file    Relationship status: Not on file  Other Topics Concern  . Not on file  Social History Narrative  . Not on file   Additional Social History: N/A    Allergies:  No Known Allergies  Labs:  Results for orders placed or performed during the hospital encounter of 08/27/17 (from the past 48 hour(s))  Comprehensive metabolic panel     Status: Abnormal   Collection Time: 08/27/17  7:21 PM  Result Value Ref Range   Sodium 142 135 - 145 mmol/L   Potassium 3.3 (L) 3.5 - 5.1 mmol/L   Chloride 108 98 - 111 mmol/L   CO2 23 22 - 32 mmol/L   Glucose, Bld 105 (H) 70 - 99 mg/dL   BUN 9 6 - 20 mg/dL   Creatinine, Ser 0.76 0.44 -  1.00 mg/dL   Calcium 9.2 8.9 - 10.3 mg/dL   Total Protein 8.4 (H) 6.5 - 8.1 g/dL   Albumin 3.9 3.5 - 5.0 g/dL   AST 22 15 - 41 U/L   ALT 13 0 - 44 U/L   Alkaline Phosphatase 97 38 - 126 U/L   Total Bilirubin 0.5 0.3 - 1.2 mg/dL   GFR calc non Af Amer >60 >60 mL/min   GFR calc Af Amer >60 >60 mL/min    Comment: (NOTE) The eGFR has been calculated using the CKD EPI equation. This calculation has not been validated in all clinical situations. eGFR's persistently <60 mL/min signify possible Chronic Kidney Disease.    Anion gap 11 5 - 15    Comment: Performed at [facility], [place] [address], [place], [place] [address]  Ethanol     Status: None  Collection Time: 08/27/17  7:21 PM  Result Value Ref Range   Alcohol, Ethyl (B) <10 <10 mg/dL    Comment: (NOTE) Lowest detectable limit for serum alcohol is 10 mg/dL. For medical purposes only. Performed at [facility], [place] [address], [place], [place] [address]   cbc     Status: None   Collection Time: 08/27/17  7:21 PM  Result Value Ref Range   WBC 7.9 4.0 - 10.5 K/uL   RBC 4.65 3.87 - 5.11 MIL/uL   Hemoglobin 13.7 12.0 - 15.0 g/dL   HCT 39.3 36.0 - 46.0 %   MCV 84.5 78.0 - 100.0 fL   MCH 29.5 26.0 - 34.0 pg   MCHC 34.9 30.0 - 36.0 g/dL   RDW 12.1 11.5 - 15.5 %   Platelets 386 150 - 400 K/uL    Comment: Performed at [facility], [place] [address] [place], [place] [address]  I-Stat beta hCG blood, ED     Status: None   Collection Time: 08/27/17  7:24 PM  Result Value Ref Range   I-stat hCG, quantitative <5.0 <5 mIU/mL   Comment 3            Comment:   GEST. AGE      CONC.  (mIU/mL)   <=1 WEEK        5 - 50     2 WEEKS       50 - 500     3 WEEKS       100 - 10,000     4 WEEKS     1,000 - 30,000        FEMALE AND NON-PREGNANT FEMALE:     LESS THAN 5 mIU/mL   Rapid urine drug screen (hospital performed)     Status: Abnormal   Collection Time: 08/27/17 11:45 PM  Result  Value Ref Range   Opiates NONE DETECTED NONE DETECTED   Cocaine NONE DETECTED NONE DETECTED   Benzodiazepines NONE DETECTED NONE DETECTED   Amphetamines NONE DETECTED NONE DETECTED   Tetrahydrocannabinol POSITIVE (A) NONE DETECTED   Barbiturates NONE DETECTED NONE DETECTED    Comment: (NOTE) DRUG SCREEN FOR MEDICAL PURPOSES ONLY.  IF CONFIRMATION IS NEEDED FOR ANY PURPOSE, NOTIFY LAB WITHIN 5 DAYS. LOWEST DETECTABLE LIMITS FOR URINE DRUG SCREEN Drug Class                     Cutoff (ng/mL) Amphetamine and metabolites    1000 Barbiturate and metabolites    200 Benzodiazepine                 607 Tricyclics and metabolites     300 Opiates and metabolites        300 Cocaine and metabolites        300 THC                            50 Performed at [facility], [place] [address], [place],  [address]     Current Facility-Administered Medications  Medication Dose Route Frequency Provider Last Rate Last Dose  . diphenhydrAMINE (BENADRYL) injection 50 mg  50 mg Intramuscular Q6H PRN Mortis, Gabrielle I, PA-C      . haloperidol (HALDOL) tablet 5 mg  5 mg Oral Q6H PRN Mortis, Gabrielle I, PA-C      . haloperidol lactate (HALDOL) injection 5 mg  5 mg Intramuscular Q6H PRN Mortis,  Jonelle Sports, PA-C      . hydrOXYzine (ATARAX/VISTARIL) tablet 50 mg  50 mg Oral Q6H PRN Mortis, Alvie Heidelberg I, PA-C      . LORazepam (ATIVAN) injection 2 mg  2 mg Intramuscular Q6H PRN Mortis, Alvie Heidelberg I, PA-C      . LORazepam (ATIVAN) tablet 2 mg  2 mg Oral Q6H PRN Mortis, Gabrielle I, PA-C      . traZODone (DESYREL) tablet 50 mg  50 mg Oral QHS PRN Mortis, Jonelle Sports, PA-C       Current Outpatient Medications  Medication Sig Dispense Refill  . citalopram (CELEXA) 20 MG tablet Take 1 tablet (20 mg total) by mouth daily. 30 tablet 3  . albuterol (PROAIR HFA) 108 (90 Base) MCG/ACT inhaler INHALE 2 PUFFS INTO THE LUNGS EVERY 4 (FOUR) HOURS AS NEEDED FOR WHEEZING OR SHORTNESS OF BREATH. 8.5  Inhaler 3    Musculoskeletal: Strength & Muscle Tone: within normal limits Gait & Station: normal Patient leans: N/A  Psychiatric Specialty Exam: Physical Exam  Nursing note and vitals reviewed. Constitutional: She is oriented to person, place, and time. She appears well-developed and well-nourished.  HENT:  Head: Normocephalic and atraumatic.  Neck: Normal range of motion.  Musculoskeletal: Normal range of motion.  Neurological: She is alert and oriented to person, place, and time.  Psychiatric: Her speech is normal and behavior is normal. Thought content is paranoid and delusional. Cognition and memory are normal. She expresses impulsivity. She exhibits a depressed mood.    Review of Systems  Psychiatric/Behavioral: Positive for depression and substance abuse. Negative for hallucinations, memory loss and suicidal ideas. The patient is not nervous/anxious and does not have insomnia.   All other systems reviewed and are negative.   Blood pressure 104/64, pulse 77, temperature 98 F (36.7 C), temperature source Oral, resp. rate 18, height '5\' 2"'  (1.575 m), weight 207 lb (93.9 kg), last menstrual period 08/25/2017, SpO2 100 %.Body mass index is 37.86 kg/m.  General Appearance: Casual  Eye Contact:  Good  Speech:  Clear and Coherent and Normal Rate  Volume:  Decreased  Mood:  Depressed  Affect:  Congruent and Depressed  Thought Process:  Linear and Descriptions of Associations: Intact  Orientation:  Full (Time, Place, and Person)  Thought Content:  Illogical  Suicidal Thoughts:  No  Homicidal Thoughts:  No  Memory:  Immediate;   Good Recent;   Fair Remote;   Fair  Judgement:  Impaired  Insight:  Lacking  Psychomotor Activity:  Normal  Concentration:  Concentration: Fair and Attention Span: Fair  Recall:  [employer] of Knowledge:  Good  Language:  Good  Akathisia:  No  Handed:  Right  AIMS (if indicated):   N/A  Assets:  Sales promotion account executive Housing  ADL's:  Intact  Cognition:  WNL  Sleep:   N/A     Treatment Plan Summary: Daily contact with patient to assess and evaluate symptoms and progress in treatment and Medication management (see MAR)  Disposition: Recommend psychiatric Inpatient admission when medically cleared. TTS to seek placement  Ethelene Hal, NP 08/28/2017 12:21 PM   Patient seen face-to-face for psychiatric evaluation, chart reviewed and case discussed with the physician extender and developed treatment plan. Reviewed the information documented and agree with the treatment plan.  Buford Dresser, DO 08/28/17 3:48 PM

## 2017-08-28 NOTE — ED Notes (Signed)
Pt is responding to internal stimuli.  She is good at hiding it and whenever she sees someone looking at her she stops speaking.  She denies AVH, S/I, and H/I.  15 minute checks and video monitoring in place.

## 2017-08-28 NOTE — ED Notes (Signed)
[facility] [employer], [id number].  Pending [employer] transport.

## 2017-08-28 NOTE — BH Assessment (Signed)
[facility]  Per Juanetta BeetsJacqueline Norman, DO, this pt requires psychiatric hospitalization.  Berneice Heinrichina Tate, RN, [facility]; [facility] will be ready to receive pt at 21:00.  Pt presents under IVC initiated by pt's mother, and upheld by Dr Sharma CovertNorman, and [facility].  Pt's nurse, Kendal Hymendie, has been notified, and agrees to call report to [phone].  Pt is to be transported via [employer].   Doylene Canninghomas Nikhil Osei, [place] Behavioral Health Coordinator [phone]

## 2017-08-28 NOTE — ED Notes (Signed)
Pt A&O x 3, no distress noted, watching TV at present.  Monitoring for safety, Q 15 min checks in effect.  [facility] [employer] [facility], pt is IVC.

## 2017-08-28 NOTE — ED Notes (Signed)
[employer] transport requested. 

## 2017-08-29 ENCOUNTER — Other Ambulatory Visit: Payer: Self-pay

## 2017-08-29 DIAGNOSIS — F333 Major depressive disorder, recurrent, severe with psychotic symptoms: Principal | ICD-10-CM

## 2017-08-29 DIAGNOSIS — F1210 Cannabis abuse, uncomplicated: Secondary | ICD-10-CM

## 2017-08-29 DIAGNOSIS — G4700 Insomnia, unspecified: Secondary | ICD-10-CM

## 2017-08-29 DIAGNOSIS — F19959 Other psychoactive substance use, unspecified with psychoactive substance-induced psychotic disorder, unspecified: Secondary | ICD-10-CM

## 2017-08-29 LAB — LIPID PANEL
CHOLESTEROL: 201 mg/dL — AB (ref 0–200)
HDL: 47 mg/dL (ref >40)
LDL Cholesterol: 135 mg/dL — ABNORMAL HIGH (ref 0–99)
TRIGLYCERIDES: 93 mg/dL (ref <150)
Total CHOL/HDL Ratio: 4.3 RATIO
VLDL: 19 mg/dL (ref 0–40)

## 2017-08-29 LAB — HEMOGLOBIN A1C
Hgb A1c MFr Bld: 4.8 % (ref 4.8–5.6)
MEAN PLASMA GLUCOSE: 91.06 mg/dL

## 2017-08-29 LAB — T4, FREE
FREE T4: 0.96 ng/dL (ref 0.82–1.77)
Free T4: 1 ng/dL (ref 0.82–1.77)

## 2017-08-29 LAB — TSH: TSH: 5.382 u[IU]/mL — ABNORMAL HIGH (ref 0.350–4.500)

## 2017-08-29 MED ORDER — ARIPIPRAZOLE 5 MG PO TABS
5.0000 mg | ORAL_TABLET | Freq: Every day | ORAL | Status: DC
  Administered 2017-08-29 – 2017-09-03 (×6): 5 mg via ORAL
  Filled 2017-08-29 (×6): qty 1
  Filled 2017-08-29: qty 7
  Filled 2017-08-29 (×2): qty 1

## 2017-08-29 MED ORDER — FLUOXETINE HCL 20 MG PO CAPS
40.0000 mg | ORAL_CAPSULE | Freq: Every day | ORAL | Status: DC
  Administered 2017-08-29 – 2017-09-03 (×6): 40 mg via ORAL
  Filled 2017-08-29 (×4): qty 2
  Filled 2017-08-29: qty 14
  Filled 2017-08-29 (×4): qty 2

## 2017-08-29 MED ORDER — MELATONIN 3 MG PO TABS
3.0000 mg | ORAL_TABLET | Freq: Every day | ORAL | Status: DC
  Administered 2017-08-29 – 2017-09-02 (×5): 3 mg via ORAL
  Filled 2017-08-29 (×7): qty 1

## 2017-08-29 NOTE — H&P (Signed)
Psychiatric Admission Assessment Adult  Patient Identification: Renee [name] MRN:  [id number] Date of Evaluation:  08/29/2017 Chief Complaint:  schizoaffective  Principal Diagnosis: Severe recurrent major depression with psychotic features [facility]) Diagnosis:   Patient Active Problem List   Diagnosis Date Noted  . 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 Marijuana abuse [F12.10] 08/29/2017  . Severe recurrent major depression with psychotic features (HCC) [F33.3] 08/28/2017  . Involuntary commitment [Z04.6]   . Schizoaffective disorder (HCC) [F25.9]   . Depression [F32.9] 08/21/2017  . Severe episode of recurrent major depressive disorder, without psychotic features (HCC) [F33.2] 08/20/2017  . Asthma with exacerbation [J45.901] 04/26/2014  . Heart palpitations [R00.2] 12/15/2013  . Blurred vision, bilateral [H53.8] 12/15/2013  . Decreased visual acuity [H54.7] 12/15/2013  . Generalized anxiety disorder [F41.1] 12/15/2013  . Tension headache [G44.209] 12/15/2013  . Encounter for sexually transmitted disease counseling [Z70.8] 12/29/2012  . General counseling and advice on female contraception [Z30.09] 12/29/2012  . Paresthesias [R20.2] 08/05/2012  . Tongue lesion [K14.8] 08/05/2012  . ASTHMA, PERSISTENT [J45.909] 06/22/2008  . CHILDHOOD OBESITY [E66.9] 08/27/2007  . 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 ALLERGIC RHINITIS [J30.9] 06/20/2006   Renee [name] is a 22 y.o. female who presented to [facility] on voluntary basis with complaint of depressed mood, passive suicidal ideation, and other depressive symptoms.  Pt was accompanied by her brother and mother.  Pt lives in [place] with her mother.  Per report, she attends college and also works.  She has never received any outpatient or inpatient psychiatric/therapy care.  Pt has not been assessed by TTS before.  History was provided by Pt and Pt's mother. Pt reported that she has experienced the  following symptoms over the last two months:  Passive suicidal ideation (no Despondency; poor sleep (5 hours per night); lowered appetite (lost 15 lbs over two months); feelings of worthlessness and hopelessness; fatigue.  Pt also endorsed recent stressor -- she recently lost a job and may lose another because she is lethargic and avolitional.  Pt denied past suicide attempt, homicidal ideation, hallucination, self-injurious behavior, and substance use.  Pt appeared apathetic and lethargic.  Her responses were ambiguous.  Chartered loss adjuster spoke with mother separately.  Mother reported that she is increasingly concerned about Pt because Pt cut her wrist last week (superficial cut, no sutures/stitches required), because Pt is endorsing passive suicidal ideation, and because Pt appears to be responding to internal stimuli.  Mother requested inpatient treatment or an overnight observation. Pt denied trauma, reported that she felt safe at home, and denied current stressors.  ''I don't know... I don't feel good or like doing anything."    On initial assessment, Pt presented as alert and oriented.  Pt was dressed in street clothes, and she appeared appropriately groomed.  Pt's demeanor was guarded.  Pt's mood was sad.  Affect was blunted. Pt endorsed passive suicidal ideation, despondency, and other depressive symptoms.  She reports that it is hard to get out of bed every day, and hard to go to sleep at night. She has not been going out of the home.  She specifically denies active suicidal ideation, plan or intent. She endorses self harm, and shows old scar on left forearm. She denies past suicide attempt. Patient endorses "using weed to manage stress, but denies regular use. She describes stressors as school (studies biology at [employer] and working at [employer], which was too fast paced). Patient denies AVH, and states that she was trying to get her water  bottle out of the trunk of the car, not guns.  Family has not been  available for collateral after multiple phone attempts.  Patient is agreeable to medication changes and after reviewing risk of marijuana use and psychosis/schizophrenia, she states she intends to abstain. Reports a good appetite and states last night she was resting well. Renee [name] denies any symptoms of delusional thoughts or paranoia, and does not appear to be responding to any internal stimuli. Patient is visible on the milieu.  Patient seen attending group sessions with active and engaged participation. Renee [name] has agreed to continue the current plan of care already in progress. She denies any other issues or concerns. Support encouragement reassurance was provided.  Associated Signs/Symptoms: Depression Symptoms:  depressed mood, anhedonia, insomnia, hypersomnia, psychomotor retardation, fatigue, feelings of worthlessness/guilt, difficulty concentrating, hopelessness, impaired memory, recurrent thoughts of death, suicidal thoughts without plan, anxiety, loss of energy/fatigue, disturbed sleep, weight loss, decreased appetite, (Hypo) Manic Symptoms:  denies Anxiety Symptoms:  denies Psychotic Symptoms:  Hallucinations: with marijuana use PTSD Symptoms: NA Total Time spent with patient: 55 min  Past Psychiatric History: depression, self harm/cutting  Is the patient at risk to self? Yes.    Has the patient been a risk to self in the past 6 months? Yes.    Has the patient been a risk to self within the distant past? Yes.    Is the patient a risk to others? Yes.    Has the patient been a risk to others in the past 6 months? Yes.    Has the patient been a risk to others within the distant past? Yes.     Prior Inpatient Therapy:   denies Prior Outpatient Therapy:  yes  Alcohol Screening: 1. How often do you have a drink containing alcohol?: Never 2. How many drinks containing alcohol do you have on a typical day when you are drinking?: 1 or 2 3. How often  do you have six or more drinks on one occasion?: Never AUDIT-C Score: 0 4. How often during the last year have you found that you were not able to stop drinking once you had started?: Never 5. How often during the last year have you failed to do what was normally expected from you becasue of drinking?: Never 6. How often during the last year have you needed a first drink in the morning to get yourself going after a heavy drinking session?: Never 7. How often during the last year have you had a feeling of guilt of remorse after drinking?: Never 8. How often during the last year have you been unable to remember what happened the night before because you had been drinking?: Never 9. Have you or someone else been injured as a result of your drinking?: No 10. Has a relative or friend or a doctor or another health worker been concerned about your drinking or suggested you cut down?: No Alcohol Use Disorder Identification Test Final Score (AUDIT): 0 Intervention/Follow-up: AUDIT Score <7 follow-up not indicated Substance Abuse History in the last 12 months:  Yes.   Consequences of Substance Abuse: Medical Consequences:  hospitalization Previous Psychotropic Medications: Yes  Psychological Evaluations: Yes  Past Medical History:  Past Medical History:  Diagnosis Date  . Anxiety   . Asthma    History reviewed. No pertinent surgical history. Family History: History reviewed. No pertinent family history. Family Psychiatric  History: denies  Tobacco Screening: Have you used any form of tobacco in the last 30  days? (Cigarettes, Smokeless Tobacco, Cigars, and/or Pipes): No Social History:  Social History   Substance and Sexual Activity  Alcohol Use No     Social History   Substance and Sexual Activity  Drug Use No    Additional Social History: Marital status: Single Are you sexually active?: No What is your sexual orientation?: Heterosexual Has your sexual activity been affected by drugs,  alcohol, medication, or emotional stress?: No Does patient have children?: No           Lives with mother and 20 year old brother.               Allergies:  No Known Allergies Lab Results:  Results for orders placed or performed during the hospital encounter of 08/28/17 (from the past 48 hour(s))  Hemoglobin A1c     Status: None   Collection Time: 08/29/17  6:30 AM  Result Value Ref Range   Hgb A1c MFr Bld 4.8 4.8 - 5.6 %    Comment: (NOTE) Pre diabetes:          5.7%-6.4% Diabetes:              >6.4% Glycemic control for   <7.0% adults with diabetes    Mean Plasma Glucose 91.06 mg/dL    Comment: Performed at [facility], [address] [place], [place] [address]  Lipid panel     Status: Abnormal   Collection Time: 08/29/17  6:30 AM  Result Value Ref Range   Cholesterol 201 (H) 0 - 200 mg/dL   Triglycerides 93 <604<150 mg/dL   HDL 47 >54>40 mg/dL   Total CHOL/HDL Ratio 4.3 RATIO   VLDL 19 0 - 40 mg/dL   LDL Cholesterol 098135 (H) 0 - 99 mg/dL    Comment:        Total Cholesterol/HDL:CHD Risk Coronary Heart Disease Risk Table                     Men   Women  1/2 Average Risk   3.4   3.3  Average Risk       5.0   4.4  2 X Average Risk   9.6   7.1  3 X Average Risk  23.4   11.0        Use the calculated Patient Ratio above and the CHD Risk Table to determine the patient's CHD Risk.        ATP III CLASSIFICATION (LDL):  <100     mg/dL   Optimal  119-147100-129  mg/dL   Near or Above                    Optimal  130-159  mg/dL   Borderline  [id number]  mg/dL   High  >130>190     mg/dL   Very High Performed at [facility], [address] [place], [place] [address]   TSH     Status: Abnormal   Collection Time: 08/29/17  6:30 AM  Result Value Ref Range   TSH 5.382 (H) 0.350 - 4.500 uIU/mL    Comment: Performed by a 3rd Generation assay with a functional sensitivity of <=0.01 uIU/mL. Performed at [facility], [address]  [place], [place] [address]     Blood Alcohol level:  Lab Results  Component Value Date   [facility] <10 08/27/2017   ETH <10 08/20/2017    Metabolic Disorder Labs:  Lab Results  Component Value  Date   HGBA1C 4.8 08/29/2017   MPG 91.06 08/29/2017   No results found for: PROLACTIN Lab Results  Component Value Date   CHOL 201 (H) 08/29/2017   TRIG 93 08/29/2017   HDL 47 08/29/2017   CHOLHDL 4.3 08/29/2017   VLDL 19 08/29/2017   LDLCALC 135 (H) 08/29/2017   LDLCALC 117 (H) 10/26/2010    Current Medications: Current Facility-Administered Medications  Medication Dose Route Frequency Provider Last Rate Last Dose  . acetaminophen (TYLENOL) tablet 650 mg  650 mg Oral Q6H PRN Nira Conn A, NP   650 mg at 08/29/17 0933  . albuterol (PROVENTIL HFA;VENTOLIN HFA) 108 (90 Base) MCG/ACT inhaler 2 puff  2 puff Inhalation Q6H Laveda Abbe, NP      . alum & mag hydroxide-simeth (MAALOX/MYLANTA) 200-200-20 MG/5ML suspension 30 mL  30 mL Oral Q4H PRN Nira Conn A, NP      . citalopram (CELEXA) tablet 20 mg  20 mg Oral Daily Laveda Abbe, NP   20 mg at 08/29/17 0751  . haloperidol lactate (HALDOL) injection 5 mg  5 mg Intramuscular Q6H PRN Laveda Abbe, NP      . hydrOXYzine (ATARAX/VISTARIL) tablet 50 mg  50 mg Oral Q6H PRN Laveda Abbe, NP   50 mg at 08/28/17 2237  . LORazepam (ATIVAN) injection 2 mg  2 mg Intramuscular Q6H PRN Laveda Abbe, NP      . LORazepam (ATIVAN) tablet 2 mg  2 mg Oral Q6H PRN Laveda Abbe, NP      . magnesium hydroxide (MILK OF MAGNESIA) suspension 30 mL  30 mL Oral Daily PRN Nira Conn A, NP      . traZODone (DESYREL) tablet 50 mg  50 mg Oral QHS PRN Laveda Abbe, NP   50 mg at 08/28/17 2237   PTA Medications: Medications Prior to Admission  Medication Sig Dispense Refill Last Dose  . albuterol (PROAIR HFA) 108 (90 Base) MCG/ACT inhaler INHALE 2 PUFFS INTO THE LUNGS EVERY 4 (FOUR) HOURS AS NEEDED FOR WHEEZING  OR SHORTNESS OF BREATH. 8.5 Inhaler 3 unknown  . citalopram (CELEXA) 20 MG tablet Take 1 tablet (20 mg total) by mouth daily. 30 tablet 3 Past Week at Unknown time    Musculoskeletal: Strength & Muscle Tone: within normal limits Gait & Station: normal Patient leans: N/A  Psychiatric Specialty Exam: Physical Exam  Nursing note and vitals reviewed. Constitutional: She is oriented to person, place, and time. She appears well-developed and well-nourished. No distress.  Musculoskeletal: Normal range of motion.  Neurological: She is alert and oriented to person, place, and time.  Psychiatric:  Depressed     Review of Systems  Constitutional: Negative.   Respiratory: Negative.   Cardiovascular: Negative.   Gastrointestinal: Negative.   Musculoskeletal: Negative.   Neurological: Negative.   Psychiatric/Behavioral: Positive for depression, substance abuse and suicidal ideas. Negative for hallucinations. The patient has insomnia. The patient is not nervous/anxious.     Blood pressure 110/72, pulse 94, temperature 97.9 F (36.6 C), temperature source Oral, resp. rate 16, height 5\' 2"  (1.575 m), weight 93.9 kg (207 lb), last menstrual period 08/25/2017.Body mass index is 37.86 kg/m.  General Appearance: Casual and Disheveled  Eye Contact:  Fair  Speech:  Clear and Coherent and Normal Rate  Volume:  Normal  Mood:  Depressed  Affect:  Congruent and Flat  Thought Process:  Linear  Orientation:  Full (Time, Place, and Person)  Thought Content:  Logical and  Hallucinations: denies  Suicidal Thoughts:  passive SI  Homicidal Thoughts:  No  Memory:  Immediate;   Fair Recent;   Fair Remote;   Fair  Judgement:  Poor  Insight:  Shallow  Psychomotor Activity:  Normal  Concentration:  Concentration: Fair and Attention Span: Fair  Recall:  [employer] of Knowledge:  Fair  Language:  Good  Akathisia:  No  AIMS (if indicated):     Assets:  Desire for Improvement Housing Social Support   ADL's:  Intact  Cognition:  WNL  Sleep:  Number of Hours: 6.25    Treatment Plan Summary: Daily contact with patient to assess and evaluate symptoms and progress in treatment and Medication management  Observation Level/Precautions:  15 minute checks  Laboratory:  CBC Chemistry Profile HbAIC UDS lipid; TSH elevated,  Have added free T4 and T3 to R/O hypothryroid at cause of depressive symptoms.  Psychotherapy:  Attend groups on unit  Medications:  Discontinue Celexa; start Prozac 40 mg QD, start Abilify 5 mg QD for depression augmentation and psychosis  Consultations:  n/a  Discharge Concerns:  Treatment compliance  Estimated LOS: 2-5 days  Other:  Substance use   Physician Treatment Plan for Primary Diagnosis: Severe recurrent major depression with psychotic features (HCC) Long Term Goal(s): Improvement in symptoms so as ready for discharge  Short Term Goals: Ability to identify changes in lifestyle to reduce recurrence of condition will improve, Ability to verbalize feelings will improve, Ability to disclose and discuss suicidal ideas, Ability to demonstrate self-control will improve, Ability to identify and develop effective coping behaviors will improve, Compliance with prescribed medications will improve and Ability to identify triggers associated with substance abuse/mental health issues will improve  Physician Treatment Plan for Secondary Diagnosis: Principal Problem:   Severe recurrent major depression with psychotic features (HCC) Active Problems:   Substance-induced psychotic disorder (HCC)   Marijuana abuse  Long Term Goal(s): Improvement in symptoms so as ready for discharge  Short Term Goals: Ability to identify changes in lifestyle to reduce recurrence of condition will improve, Ability to verbalize feelings will improve, Ability to disclose and discuss suicidal ideas, Ability to identify and develop effective coping behaviors will improve, Compliance with prescribed  medications will improve and Ability to identify triggers associated with substance abuse/mental health issues will improve  I certify that inpatient services furnished can reasonably be expected to improve the patient's condition.    Mariel Craft, MD 8/2/20193:04 PM

## 2017-08-29 NOTE — Plan of Care (Signed)
Nurse discussed depression, anxiety, coping skills with patient.  

## 2017-08-29 NOTE — Progress Notes (Signed)
Adult Psychoeducational Group Note  Date:  08/29/2017 Time:  9:07 AM  Group Topic/Focus:  Goals Group:   The focus of this group is to help patients establish daily goals to achieve during treatment and discuss how the patient can incorporate goal setting into their daily lives to aide in recovery.  Participation Level:  Active  Participation Quality:  Appropriate  Affect:  Appropriate  Cognitive:  Appropriate  Insight: Appropriate  Engagement in Group:  Engaged  Modes of Intervention:  Discussion  Additional Comments:  Pt has a goal of meeting with the doctor for the first time today.   Deforest Hoyleslexandre J Vernelle Wisner 08/29/2017, 9:07 AM

## 2017-08-29 NOTE — Progress Notes (Signed)
The focus of this group is to help patients review their daily goal of treatment and discuss progress on daily workbooks. Pt did not attend the evening group. 

## 2017-08-29 NOTE — Progress Notes (Addendum)
Patient ID: Renee [name], female   DOB: 07/18/1995, 22 y.o.   MRN: [id number]  Admission Note:  [age] yr female who presents IVC in no acute distress for the treatment of Psychosis and non compliance with medications. Pt appears flat, irritable and depressed. Pt was calm and cooperative with admission process. Pt not very cooperative at this time so pt does not forward much information. Pt irritable. Pt stated there was nothing wrong with her and she did not need to be here. Pt did appear to be responding to internal stimuli before writer walked in , so during the admission process pt had very little to say to writer at this time.   A: Skin was assessed (Kim-RN) and found to be clear of any abnormal marks apart from a scar on L-forearm. PT searched and no contraband found, POC and unit policies explained and understanding verbalized. Consents obtained. Food and fluids offered, and  Accepted.  R: Pt had no additional questions or concerns.

## 2017-08-29 NOTE — BHH Counselor (Signed)
Adult Comprehensive Assessment  Patient ID: Renee [name], female   DOB: 07/24/1995, 22 y.o.   MRN: [id number]  Information Source: Information source: Patient  Current Stressors:  Patient states their primary concerns and needs for treatment are:: Patient stated she has personal issues, school, lquit job ast month. Patient states their goals for this hospitilization and ongoing recovery are:: Patient stated she wants to get out of here and start attending [employer] majoring in [employer] and get her life back. Educational / Learning stressors: Patient wants to attend  [employer]. Employment / Job issues: Patient quit her job last month after working there for a couple of weeks. She stated the job wasn't really for her. Family Relationships: Patient denies. Financial / Lack of resources (include bankruptcy): Patient denies. Housing / Lack of housing: [place] denies, Physical health (include injuries & life threatening diseases): Patient denies. Social relationships: Patient denies. Substance abuse: Patient denies. Bereavement / Loss: Patient denies.  Living/Environment/Situation:  Living Arrangements: Parent, Other relatives Living conditions (as described by patient or guardian): Patient stated living conditions are adequate in the home. Who else lives in the home?: Patient lives in the home with her mother and brother. How long has patient lived in current situation?: Patient stated she and her family have lived in their current residence for about a year. What is atmosphere in current home: Comfortable, Loving  Family History:  Marital status: Single Are you sexually active?: No What is your sexual orientation?: Heterosexual Has your sexual activity been affected by drugs, alcohol, medication, or emotional stress?: No Does patient have children?: No  Childhood History:  By whom was/is the patient raised?: Both parents Additional childhood history information: Patient was raised  primarily by her mother but her father would get her sometimes. Description of patient's relationship with caregiver when they were a child: Patient stated she had a good relationship with her mother. Patient stated she had a good relationship with her father. Patient's description of current relationship with people who raised him/her: Patient stated she has a good relationship with her mother and father. How were you disciplined when you got in trouble as a child/adolescent?: Patient stated she received punishment such as phone being taken away. She received a spanking sometimes. Does patient have siblings?: Yes Number of Siblings: 2(Patient has one 20 yo brother; father and his girlfriend are expecting a baby.) Description of patient's current relationship with siblings: Patient stated she has a good relationship with her brother. Did patient suffer any verbal/emotional/physical/sexual abuse as a child?: No Did patient suffer from severe childhood neglect?: No Has patient ever been sexually abused/assaulted/raped as an adolescent or adult?: No Was the patient ever a victim of a crime or a disaster?: No Witnessed domestic violence?: No Has patient been effected by domestic violence as an adult?: No  Education:  Highest grade of school patient has completed: 11th Currently a student?: No Learning disability?: No  Employment/Work Situation:   Employment situation: Unemployed Patient's job has been impacted by current illness: No What is the longest time patient has a held a job?: For about 2 weeks. Where was the patient employed at that time?: [employer] Did You Receive Any Psychiatric Treatment/Services While in the Military?: No Are There Guns or Other Weapons in Your Home?: No  Financial Resources:   Financial resources: Support from parents / caregiver Does patient have a Lawyerrepresentative payee or guardian?: No  Alcohol/Substance Abuse:   What has been your use of drugs/alcohol  within the last  12 months?: Patient stated she has smoked weed once. If attempted suicide, did drugs/alcohol play a role in this?: No Alcohol/Substance Abuse Treatment Hx: Denies past history Has alcohol/substance abuse ever caused legal problems?: No  Social Support System:   Patient's Community Support System: Good Describe Community Support System: Mother, brother Type of faith/religion: Christianity/Baptist How does patient's faith help to cope with current illness?: Go to church more and get out of the house more.  Leisure/Recreation:   Leisure and Hobbies: Chll out, read  Strengths/Needs:   What is the patient's perception of their strengths?: Patient is good in biology, drawing Patient states they can use these personal strengths during their treatment to contribute to their recovery: Patient stated she could draw more and help her get back to school. Patient states these barriers may affect/interfere with their treatment: Patient identified no barriers Patient states these barriers may affect their return to the community: Patient identified no barriers. Other important information patient would like considered in planning for their treatment: Patient stated she was involuntarily committed and she wants to be voluntary.  Discharge Plan:   Currently receiving community mental health services: No Patient states concerns and preferences for aftercare planning are: Patient stated she has no concerns or preferences. Patient states they will know when they are safe and ready for discharge when: Patient stated she doesn't know, she is ready now and she does't really feel like anything is wrong with her.  Does patient have access to transportation?: Yes Does patient have financial barriers related to discharge medications?: No Will patient be returning to same living situation after discharge?: Yes  Summary/Recommendations:   Summary and Recommendations (to be completed by the  evaluator): Renee [name] is an 22 y.o. female, who presents involuntary and unaccompanied to [facility]. As clinician entered the pt's room she observed the pt talking to herself and laughing. Clinician asked the pt, "what brought you to the hospital?" Pt reported, "court order, the magistrate." Pt reported, "they think I'm gonna hurt my family, my mom lied." Pt reported her mother removed all the weapons from the house." Pt reported, she was going into the trunk to get her water bottle, her mother stopped her and she was taken to the hospital. Pt reported, she wanted to go for a walk. Pt reported, her mother has a gun and they have kitchen knives. Pt denies, SI, HI, AVH, self-injurious behaviors and access to weapons.   Recommendations:?Patient will benefit from crisis stabilization, medication evaluation, group therapy and psychoeducation, in addition to case management for discharge planning. At discharge it is recommended that Patient adhere to the established discharge plan and continue in treatment.  However, patient refused recommendations for therapy and psychiatric med management. She stated she currently receives treatment at her PCP [facility]) and prefers to continue.  Anticipated Outcomes:?Mood will be stabilized, crisis will be stabilized, medications will be established if appropriate, coping skills will be taught and practiced, family session will be done to determine discharge plan, mental illness will be normalized, patient will be better equipped to recognize symptoms and ask for assistance.  Roselyn Bering, MSW, LCSW Clinical Social Work Roselyn Bering. 08/29/2017

## 2017-08-29 NOTE — Tx Team (Signed)
Interdisciplinary Treatment and Diagnostic Plan Update  08/29/2017 Time of Session: 0830AM Renee [name] MRN: [id number]  Principal Diagnosis: MDD, recurrent, severe with psychotic features  Secondary Diagnoses: Active Problems:   Severe recurrent major depression with psychotic features (HCC)   Current Medications:  Current Facility-Administered Medications  Medication Dose Route Frequency Provider Last Rate Last Dose  . acetaminophen (TYLENOL) tablet 650 mg  650 mg Oral Q6H PRN Lindon Romp A, NP   650 mg at 08/29/17 0933  . albuterol (PROVENTIL HFA;VENTOLIN HFA) 108 (90 Base) MCG/ACT inhaler 2 puff  2 puff Inhalation Q6H Ethelene Hal, NP      . alum & mag hydroxide-simeth (MAALOX/MYLANTA) 200-200-20 MG/5ML suspension 30 mL  30 mL Oral Q4H PRN Lindon Romp A, NP      . citalopram (CELEXA) tablet 20 mg  20 mg Oral Daily Ethelene Hal, NP   20 mg at 08/29/17 0751  . haloperidol lactate (HALDOL) injection 5 mg  5 mg Intramuscular Q6H PRN Ethelene Hal, NP      . hydrOXYzine (ATARAX/VISTARIL) tablet 50 mg  50 mg Oral Q6H PRN Ethelene Hal, NP   50 mg at 08/28/17 2237  . LORazepam (ATIVAN) injection 2 mg  2 mg Intramuscular Q6H PRN Ethelene Hal, NP      . LORazepam (ATIVAN) tablet 2 mg  2 mg Oral Q6H PRN Ethelene Hal, NP      . magnesium hydroxide (MILK OF MAGNESIA) suspension 30 mL  30 mL Oral Daily PRN Lindon Romp A, NP      . traZODone (DESYREL) tablet 50 mg  50 mg Oral QHS PRN Ethelene Hal, NP   50 mg at 08/28/17 2237   PTA Medications: Medications Prior to Admission  Medication Sig Dispense Refill Last Dose  . albuterol (PROAIR HFA) 108 (90 Base) MCG/ACT inhaler INHALE 2 PUFFS INTO THE LUNGS EVERY 4 (FOUR) HOURS AS NEEDED FOR WHEEZING OR SHORTNESS OF BREATH. 8.5 Inhaler 3 unknown  . citalopram (CELEXA) 20 MG tablet Take 1 tablet (20 mg total) by mouth daily. 30 tablet 3 Past Week at Unknown time    Patient Stressors:  Marital or family conflict Medication change or noncompliance  Patient Strengths: Curator fund of knowledge Supportive family/friends  Treatment Modalities: Medication Management, Group therapy, Case management,  1 to 1 session with clinician, Psychoeducation, Recreational therapy.   Physician Treatment Plan for Primary Diagnosis: MDD, recurrent, severe with psychotic features  Medication Management: Evaluate patient's response, side effects, and tolerance of medication regimen.  Therapeutic Interventions: 1 to 1 sessions, Unit Group sessions and Medication administration.  Evaluation of Outcomes: Not Met  Physician Treatment Plan for Secondary Diagnosis: Active Problems:   Severe recurrent major depression with psychotic features ([place])  Medication Management: Evaluate patient's response, side effects, and tolerance of medication regimen.  Therapeutic Interventions: 1 to 1 sessions, Unit Group sessions and Medication administration.  Evaluation of Outcomes: Not Met   RN Treatment Plan for Primary Diagnosis: MDD, recurrent, severe with psychotic features  Long Term Goal(s): Knowledge of disease and therapeutic regimen to maintain health will improve  Short Term Goals: Ability to remain free from injury will improve, Ability to verbalize frustration and anger appropriately will improve, Ability to demonstrate self-control and Ability to disclose and discuss suicidal ideas  Medication Management: RN will administer medications as ordered by provider, will assess and evaluate patient's response and provide education to patient for prescribed medication. RN will report any adverse and/or side  effects to prescribing provider.  Therapeutic Interventions: 1 on 1 counseling sessions, Psychoeducation, Medication administration, Evaluate responses to treatment, Monitor vital signs and CBGs as ordered, Perform/monitor CIWA, COWS, AIMS and Fall Risk screenings as ordered,  Perform wound care treatments as ordered.  Evaluation of Outcomes: Not Met   LCSW Treatment Plan for Primary Diagnosis: MDD, recurrent, severe with psychotic features Long Term Goal(s): Safe transition to appropriate next level of care at discharge, Engage patient in therapeutic group addressing interpersonal concerns.  Short Term Goals: Engage patient in aftercare planning with referrals and resources, Increase social support, Increase ability to appropriately verbalize feelings and Increase emotional regulation  Therapeutic Interventions: Assess for all discharge needs, 1 to 1 time with Social worker, Explore available resources and support systems, Assess for adequacy in community support network, Educate family and significant other(s) on suicide prevention, Complete Psychosocial Assessment, Interpersonal group therapy.  Evaluation of Outcomes: Not Met   Progress in Treatment: Attending groups: No. New to unit. Continuing to assess.  Participating in groups: No. Taking medication as prescribed: Yes. Toleration medication: Yes. Family/Significant other contact made: No, will contact:  pt's mother who initiated IVC for collateral information and to complete SPE with pt's consent. Patient understands diagnosis: No. poor insight.  Discussing patient identified problems/goals with staff: No. Medical problems stabilized or resolved: Yes. Denies suicidal/homicidal ideation: Yes. Issues/concerns per patient self-inventory: No. Other: n/a   New problem(s) identified: No, Describe:  n/a  New Short Term/Long Term Goal(s): decrease in AVH/symtpoms of psychosis, medication management for mood stabilization; elimination of SI thoughts; development of comprehensive mental wellness/sobriety plan.   Patient Goals:  Pt declined to specify goal "I don't need help. I'm okay."  Discharge Plan or Barriers: CSW assessing for appropriate referrals. [place] pamphlet, Mobile Crisis information provided to  patient for additional community support and resources.   Reason for Continuation of Hospitalization: Depression Hallucinations Medication stabilization Suicidal ideation  Estimated Length of Stay: Tuesday, 09/02/17  Attendees: Patient: 08/29/2017 9:36 AM  Physician: Dr. Leverne Humbles MD 08/29/2017 9:36 AM  Nursing: Rise Paganini RN 08/29/2017 9:36 AM  RN Care Manager:x 08/29/2017 9:36 AM  Social Worker: Janice Norrie LCSW 08/29/2017 9:36 AM  Recreational Therapist: x 08/29/2017 9:36 AM  Other: Lindell Spar NP; Saul Fordyce NP 08/29/2017 9:36 AM  Other:  08/29/2017 9:36 AM  Other: 08/29/2017 9:36 AM    Scribe for Treatment Team: Avelina Laine, LCSW 08/29/2017 9:36 AM

## 2017-08-29 NOTE — BHH Suicide Risk Assessment (Signed)
[facility] Admission Suicide Risk Assessment   Nursing information obtained from:  Patient Demographic factors:  Unemployed Current Mental Status:  NA Loss Factors:  NA Historical Factors:  NA Risk Reduction Factors:  Positive social support  Total Time spent with patient: 1 hour Principal Problem: Severe recurrent major depression with psychotic features (HCC) Diagnosis:   Patient Active Problem List   Diagnosis Date Noted  . 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 Marijuana abuse [F12.10] 08/29/2017  . Severe recurrent major depression with psychotic features (HCC) [F33.3] 08/28/2017  . Involuntary commitment [Z04.6]   . Schizoaffective disorder (HCC) [F25.9]   . Depression [F32.9] 08/21/2017  . Severe episode of recurrent major depressive disorder, without psychotic features (HCC) [F33.2] 08/20/2017  . Asthma with exacerbation [J45.901] 04/26/2014  . Heart palpitations [R00.2] 12/15/2013  . Blurred vision, bilateral [H53.8] 12/15/2013  . Decreased visual acuity [H54.7] 12/15/2013  . Generalized anxiety disorder [F41.1] 12/15/2013  . Tension headache [G44.209] 12/15/2013  . Encounter for sexually transmitted disease counseling [Z70.8] 12/29/2012  . General counseling and advice on female contraception [Z30.09] 12/29/2012  . Paresthesias [R20.2] 08/05/2012  . Tongue lesion [K14.8] 08/05/2012  . ASTHMA, PERSISTENT [J45.909] 06/22/2008  . CHILDHOOD OBESITY [E66.9] 08/27/2007  . 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 ALLERGIC RHINITIS [J30.9] 06/20/2006   Renee T Robertsonis a 22 y.o.femalewho presented to [facility] on voluntary basis with complaint of depressed mood, passive suicidal ideation, and other depressive symptoms. Pt was accompanied by her brother and mother. Pt lives in [place] with her mother. Per report, she attends college and also works. She has never received any outpatient or inpatient psychiatric/therapy care. Pt has not been  assessed by TTS before. History was provided by Pt and Pt's mother. Pt reported that she has experienced the following symptoms over the last two months: Passive suicidal ideation (no Despondency; poor sleep (5 hours per night); lowered appetite (lost 15 lbs over two months); feelings of worthlessness and hopelessness; fatigue. Pt also endorsed recent stressor -- she recently lost a job and may lose another because she is lethargic and avolitional. Pt denied past suicide attempt, homicidal ideation, hallucination, self-injurious behavior, and substance use. Pt appeared apathetic and lethargic. Her responses were ambiguous. Chartered loss adjuster spoke with mother separately. Mother reported that she is increasingly concerned about Pt because Pt cut her wrist last week (superficial cut, no sutures/stitches required), because Pt is endorsing passive suicidal ideation, and because Pt appears to be responding to internal stimuli. Mother requested inpatient treatment or an overnight observation. Pt denied trauma, reported that she felt safe at home, and denied current stressors. ''I don't know... I don't feel good or like doing anything."   On initial assessment, Pt presented as alert and oriented. Pt was dressed in street clothes, and she appeared appropriately groomed.  Pt's demeanor was guarded. Pt's mood was sad. Affect was blunted. Pt endorsed passive suicidal ideation, despondency, and other depressive symptoms.  She reports that it is hard to get out of bed every day, and hard to go to sleep at night. She has not been going out of the home.  She specifically denies active suicidal ideation, plan or intent. She endorses self harm, and shows old scar on left forearm. She denies past suicide attempt. Patient endorses "using weed to manage stress, but denies regular use. She describes stressors as school (studies biology at [employer] and working at [employer], which was too fast paced). Patient denies AVH, and states  that she was trying to get her  water bottle out of the trunk of the car, not guns.  Family has not been available for collateral after multiple phone attempts.  Patient is agreeable to medication changes and after reviewing risk of marijuana use and psychosis/schizophrenia, she states she intends to abstain. Reports a good appetite and states last night she was resting well. Renee [name] denies any symptoms of delusional thoughts or paranoia, and does not appear to be responding to any internal stimuli. Patient is visible on the milieu. Patient seen attending group sessions with active and engaged participation. Renee Mirza Robertsonhas agreed to continue the current plan of care already in progress. She denies any other issues or concerns. Support encouragement reassurance was provided.   Continued Clinical Symptoms:  Alcohol Use Disorder Identification Test Final Score (AUDIT): 0 The "Alcohol Use Disorders Identification Test", Guidelines for Use in Primary Care, Second Edition.  World Science writer [facility]). Score between 0-7:  no or low risk or alcohol related problems. Score between 8-15:  moderate risk of alcohol related problems. Score between 16-19:  high risk of alcohol related problems. Score 20 or above:  warrants further diagnostic evaluation for alcohol dependence and treatment.   CLINICAL FACTORS:   Depression:   Aggression Comorbid alcohol abuse/dependence Hopelessness Impulsivity Severe Alcohol/Substance Abuse/Dependencies    Musculoskeletal: Strength & Muscle Tone: within normal limits Gait & Station: normal Patient leans: N/A  Psychiatric Specialty Exam: Physical Exam  Nursing note and vitals reviewed. Constitutional: She is oriented to person, place, and time. She appears well-developed and well-nourished. No distress.  Musculoskeletal: Normal range of motion.  Neurological: She is alert and oriented to person, place, and time.  Psychiatric:   Depressed     Review of Systems  Constitutional: Negative.   Respiratory: Negative.   Cardiovascular: Negative.   Gastrointestinal: Negative.   Musculoskeletal: Negative.   Neurological: Negative.   Psychiatric/Behavioral: Positive for depression, substance abuse and suicidal ideas. Negative for hallucinations. The patient has insomnia. The patient is not nervous/anxious.     Blood pressure 110/72, pulse 94, temperature 97.9 F (36.6 C), temperature source Oral, resp. rate 16, height 5\' 2"  (1.575 m), weight 93.9 kg (207 lb), last menstrual period 08/25/2017.Body mass index is 37.86 kg/m.  General Appearance: Casual and Disheveled  Eye Contact:  Fair  Speech:  Clear and Coherent and Normal Rate  Volume:  Normal  Mood:  Depressed  Affect:  Congruent and Flat  Thought Process:  Linear  Orientation:  Full (Time, Place, and Person)  Thought Content:  Logical and Hallucinations: denies  Suicidal Thoughts:  passive SI  Homicidal Thoughts:  No  Memory:  Immediate;   Fair Recent;   Fair Remote;   Fair  Judgement:  Poor  Insight:  Shallow  Psychomotor Activity:  Normal  Concentration:  Concentration: Fair and Attention Span: Fair  Recall:  [employer] of Knowledge:  Fair  Language:  Good  Akathisia:  No  AIMS (if indicated):     Assets:  Desire for Improvement Housing Social Support  ADL's:  Intact  Cognition:  WNL  Sleep:  Number of Hours: 6.25    COGNITIVE FEATURES THAT CONTRIBUTE TO RISK:  None    SUICIDE RISK:   Moderate:  Frequent suicidal ideation with limited intensity, and duration, some specificity in terms of plans, no associated intent, good self-control, limited dysphoria/symptomatology, some risk factors present, and identifiable protective factors, including available and accessible social support.  PLAN OF CARE:   Treatment Plan Summary: Daily contact with patient  to assess and evaluate symptoms and progress in treatment and Medication  management  Observation Level/Precautions:  15 minute checks  Laboratory:  CBC Chemistry Profile HbAIC UDS lipid; TSH elevated,  Have added free T4 and T3 to R/O hypothyroid at cause of depressive symptoms.  Psychotherapy:  Attend groups on unit  Medications:  Discontinue Celexa; start Prozac 40 mg QD, start Abilify 5 mg QD for depression augmentation and psychosis  Consultations:  n/a  Discharge Concerns:  Treatment compliance  Estimated LOS: 2-5 days  Other:  Substance use   Physician Treatment Plan for Primary Diagnosis: Severe recurrent major depression with psychotic features (HCC) Long Term Goal(s): Improvement in symptoms so as ready for discharge  Short Term Goals: Ability to identify changes in lifestyle to reduce recurrence of condition will improve, Ability to verbalize feelings will improve, Ability to disclose and discuss suicidal ideas, Ability to demonstrate self-control will improve, Ability to identify and develop effective coping behaviors will improve, Compliance with prescribed medications will improve and Ability to identify triggers associated with substance abuse/mental health issues will improve  Physician Treatment Plan for Secondary Diagnosis: Principal Problem:   Severe recurrent major depression with psychotic features (HCC) Active Problems:   Substance-induced psychotic disorder (HCC)   Marijuana abuse  Long Term Goal(s): Improvement in symptoms so as ready for discharge  Short Term Goals: Ability to identify changes in lifestyle to reduce recurrence of condition will improve, Ability to verbalize feelings will improve, Ability to disclose and discuss suicidal ideas, Ability to identify and develop effective coping behaviors will improve, Compliance with prescribed medications will improve and Ability to identify triggers associated with substance abuse/mental health issues will improve  I certify that inpatient services furnished can reasonably be  expected to improve the patient's condition.     Mariel CraftSHEILA M Vira Chaplin, MD 08/29/2017, 5:21 PM

## 2017-08-29 NOTE — BHH Counselor (Signed)
CSW contacted Angelique BlonderDenise Vawter/Mother at [phone] for first attempt to complete SPE. No answer and unable to leave voice message. CSW will make another attempt.   Roselyn Beringegina Memori Sammon, MSW, LCSW Clinical Social Work

## 2017-08-29 NOTE — Progress Notes (Signed)
Recreation Therapy Notes  Date: 8.2.19 Time: 1000 Location: 500 Hall  Group Topic: Communication, Team building  Goal Area(s) Addresses:  Patient will effectively communicate with peers in group.  Patient will work together to complete task. Patient will verbalize benefits of positive communication and teamwork post d/c.  Intervention: Veterinary surgeonubber discs  Activity: [place] in [place].  Each person was given a rubber disc and one disc was given to the group.  Patients had to use the discs to move from one end of the hall to the other and then comeback to the starting point.  Patients were to stand on discs the entire time.  If anyone stepped of their disc, the group had to start over.  Education: Communication, Discharge Planning  Education Outcome: Acknowledges understanding/In group clarification offered/Needs additional education.   Clinical Observations/Feedback: Pt did not attend group.     Caroll RancherMarjette Cheney Ewart, LRT/CTRS         Caroll RancherLindsay, Titianna Loomis A 08/29/2017 11:27 AM

## 2017-08-29 NOTE — Progress Notes (Signed)
Recreation Therapy Notes  INPATIENT RECREATION THERAPY ASSESSMENT  Patient Details Name: Renee [name] MRN: [id number] DOB: 12/04/1995 Today's Date: 08/29/2017       Information Obtained From: Idelia Salmatient  Able to Participate in Assessment/Interview: Yes  Patient Presentation: Alert, Oriented  Reason for Admission (Per Patient): Other (Comments)(Depression, Stress)  Patient Stressors: Work, Scientist, physiologicalchool  Coping Skills:   TV, Sports, Exercise, Music, Substance Abuse, Talk, Art, Prayer  Leisure Interests (2+):  Individual - Reading, Art - Draw  Frequency of Recreation/Participation: Other (Comment)(Daily)  Awareness of Community Resources:  No  Expressed Interest in State Street CorporationCommunity Resource Information: No  County of Residence:  [place]  Patient Main Form of Transportation: Set designerCar  Patient Strengths:  Wellsite geologistGood listener, Good with organization  Patient Identified Areas of Improvement:  Math skills  Patient Goal for Hospitalization:  "Get out"  Current SI (including self-harm):  No  Current HI:  No  Current AVH: No  Staff Intervention Plan: Group Attendance, Collaborate with Interdisciplinary Treatment Team  Consent to Intern Participation: N/A   Caroll RancherMarjette Rosalynd Mcwright, LRT/CTRS  Caroll RancherLindsay, Ravin Bendall A 08/29/2017, 12:24 PM

## 2017-08-29 NOTE — Progress Notes (Signed)
Pt received her medication this evening from writer and pt was much more pleasant and receptive to Clinical research associatewriter.

## 2017-08-29 NOTE — Progress Notes (Signed)
D:  Patient's self inventory sheet, patient sleeps good, sleep medication helpful.  Good appetite, normal energy level, good concentration.  Rated depression 5, denied hopeless and anxiety.  Withdrawals, but did not check anything.  Denied SI.  Denied physical problems.  Denied physical pain.  No pain medication.  Goal is talk to MD and discharge.  Plans to talk to MD at 0900.  No discharge plans. A:  Medications administered per MD orders.  Emotional support and encouragement given patient. R:  Denied SI and HI, contracts for safety.  Denied A/V hallucinations.  Safety maintained with 15 minute checks.

## 2017-08-29 NOTE — BHH Counselor (Signed)
CSW made 2nd attempt to complete PSA with patient's mother. Phone rang once and went straight to voice mail. CSW informed Dr. Wilder GladeMauerer of inability to speak with patient's family.   Roselyn Beringegina Edna Grover, MSW, LCSW Clinical Social Work

## 2017-08-29 NOTE — Tx Team (Signed)
Initial Treatment Plan 08/29/2017 3:12 AM Renee [name] YQM:[id number]    PATIENT STRESSORS: Marital or family conflict Medication change or noncompliance   PATIENT STRENGTHS: Communication skills General fund of knowledge Supportive family/friends   PATIENT IDENTIFIED PROBLEMS: psychosis  "help with nothing, I'm ok"                   DISCHARGE CRITERIA:  Improved stabilization in mood, thinking, and/or behavior Verbal commitment to aftercare and medication compliance  PRELIMINARY DISCHARGE PLAN: Attend aftercare/continuing care group Outpatient therapy  PATIENT/FAMILY INVOLVEMENT: This treatment plan has been presented to and reviewed with the patient, Renee [name].  The patient and family have been given the opportunity to ask questions and make suggestions.  Delos HaringPhillips, Tylar Amborn A, RN 08/29/2017, 3:12 AM

## 2017-08-30 DIAGNOSIS — F419 Anxiety disorder, unspecified: Secondary | ICD-10-CM

## 2017-08-30 DIAGNOSIS — R451 Restlessness and agitation: Secondary | ICD-10-CM

## 2017-08-30 LAB — PROLACTIN: PROLACTIN: 93 ng/mL — AB (ref 4.8–23.3)

## 2017-08-30 LAB — T3: T3 TOTAL: 137 ng/dL (ref 71–180)

## 2017-08-30 MED ORDER — HYDROXYZINE HCL 50 MG PO TABS
50.0000 mg | ORAL_TABLET | Freq: Every day | ORAL | Status: DC
  Administered 2017-08-30 – 2017-09-02 (×4): 50 mg via ORAL
  Filled 2017-08-30 (×8): qty 1

## 2017-08-30 NOTE — Progress Notes (Signed)
Patient ID: Renee [name], female   DOB: 06/11/1995, 22 y.o.   MRN: [id number] DAR Note: Pt remained flat and withdrawn to her room; remained in bed all evening. Pt continue to endorse AVH; "The voices are still there; I can't remember some of the things they say." Pt denied command AH. Pt endorsed moderate anxiety and depression. Pt denied SI, HI or pain. All patient's questions and concerns addressed. Support, encouragement, and safe environment provided. 15-minute safety checks continue. Safety checks continue. Pt continue to be med compliant.

## 2017-08-30 NOTE — Progress Notes (Signed)
[facility] MD Progress Note  08/30/2017 11:28 AM Renee [name]  MRN:  [id number]  Subjective: Renee [name] reports, "I'm feeling a little better. I'm not feeling that depressed today. I don't have any anxiety symptoms today. I slept very poorly last night. I tossed & turned all night long. The slepp medicines did not help me. I did attend some group session yesterday. I have not been to any today".    Renee T Robertsonis a 22 y.o.femalewho presented to [facility] on voluntary basis with complaint of depressed mood, passive suicidal ideation, and other depressive symptoms. Pt was accompanied by her brother and mother. Pt lives in [place] with her mother. Per report, she attends college and also works. She has never received any outpatient or inpatient psychiatric/therapy care. Pt has not been assessed by TTS before. History was provided by Pt and Pt's mother. Pt reported that she has experienced the following symptoms over the last two months: Passive suicidal ideation (no Despondency; poor sleep (5 hours per night); lowered appetite (lost 15 lbs over two months); feelings of worthlessness and hopelessness; fatigue. Pt also endorsed recent stressor -- she recently lost a job and may lose another because she is lethargic and avolitional. Pt denied past suicide attempt, homicidal ideation, hallucination, self-injurious behavior, and substance use. Pt appeared apathetic and lethargic. Her responses were ambiguous. Chartered loss adjuster spoke with mother separately. Mother reported that she is increasingly concerned about Pt because Pt cut her wrist last week (superficial cut, no sutures/stitches required), because Pt is endorsing passive suicidal ideation, and because Pt appears to be responding to internal stimuli. Mother requested inpatient treatment or an overnight observation. Pt denied trauma, reported that she felt safe at home, and denied current stressors. ''I don't know... I don't feel good or like doing  anything."  Renee [name] is seen, chart reviewed. The chart findings discussed with the treatment team. She is lying down in her bed. She says she did not sleep well last night. She says her mood is getting better. She is taking & tolerating her treatment regimen. Denies any adverse effects. Denies any anxiety symptoms. She currently denies any SIHI, AVH, delusional thoughts or paranoia.  Discontinued Trazodone 50 mg, initiated Hydroxyzine 50 mg po Q hs. Renee [name] has agreed to continue her current plan of care with the changes/adjustments made.  Principal Problem: Severe recurrent major depression with psychotic features [facility])  Diagnosis:   Patient Active Problem List   Diagnosis Date Noted  . 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 Marijuana abuse [F12.10] 08/29/2017  . Severe recurrent major depression with psychotic features (HCC) [F33.3] 08/28/2017  . Involuntary commitment [Z04.6]   . Schizoaffective disorder (HCC) [F25.9]   . Depression [F32.9] 08/21/2017  . Severe episode of recurrent major depressive disorder, without psychotic features (HCC) [F33.2] 08/20/2017  . Asthma with exacerbation [J45.901] 04/26/2014  . Heart palpitations [R00.2] 12/15/2013  . Blurred vision, bilateral [H53.8] 12/15/2013  . Decreased visual acuity [H54.7] 12/15/2013  . Generalized anxiety disorder [F41.1] 12/15/2013  . Tension headache [G44.209] 12/15/2013  . Encounter for sexually transmitted disease counseling [Z70.8] 12/29/2012  . General counseling and advice on female contraception [Z30.09] 12/29/2012  . Paresthesias [R20.2] 08/05/2012  . Tongue lesion [K14.8] 08/05/2012  . ASTHMA, PERSISTENT [J45.909] 06/22/2008  . CHILDHOOD OBESITY [E66.9] 08/27/2007  . 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 ALLERGIC RHINITIS [J30.9] 06/20/2006   Total Time spent with patient: 25 minutes  Past Psychiatric History: See H&P.  Past Medical History:  Past Medical History:  Diagnosis Date  . Anxiety   . Asthma    History reviewed. No pertinent surgical history. Family History: History reviewed. No pertinent family history.  Family Psychiatric  History: See H&P  Social History:  Social History   Substance and Sexual Activity  Alcohol Use No     Social History   Substance and Sexual Activity  Drug Use No    Social History   Socioeconomic History  . Marital status: Single    Spouse name: Not on file  . Number of children: Not on file  . Years of education: Not on file  . Highest education level: Not on file  Occupational History  . Not on file  Social Needs  . Financial resource strain: Not on file  . Food insecurity:    Worry: Not on file    Inability: Not on file  . Transportation needs:    Medical: Not on file    Non-medical: Not on file  Tobacco Use  . Smoking status: Never Smoker  . Smokeless tobacco: Never Used  Substance and Sexual Activity  . Alcohol use: No  . Drug use: No  . Sexual activity: Never  Lifestyle  . Physical activity:    Days per week: Not on file    Minutes per session: Not on file  . Stress: Not on file  Relationships  . Social connections:    Talks on phone: Not on file    Gets together: Not on file    Attends religious service: Not on file    Active member of club or organization: Not on file    Attends meetings of clubs or organizations: Not on file    Relationship status: Not on file  Other Topics Concern  . Not on file  Social History Narrative  . Not on file   Additional Social History:   Sleep: Good  Appetite:  Good  Current Medications: Current Facility-Administered Medications  Medication Dose Route Frequency Provider Last Rate Last Dose  . acetaminophen (TYLENOL) tablet 650 mg  650 mg Oral Q6H PRN Nira ConnBerry, Jason A, NP   650 mg at 08/29/17 0933  . albuterol (PROVENTIL HFA;VENTOLIN HFA) 108 (90 Base) MCG/ACT inhaler 2 puff  2 puff Inhalation Q6H Laveda AbbeParks, Laurie Britton, NP   2 puff at  08/29/17 1638  . alum & mag hydroxide-simeth (MAALOX/MYLANTA) 200-200-20 MG/5ML suspension 30 mL  30 mL Oral Q4H PRN Nira ConnBerry, Jason A, NP      . ARIPiprazole (ABILIFY) tablet 5 mg  5 mg Oral Daily Mariel CraftMaurer, Sheila M, MD   5 mg at 08/30/17 0810  . citalopram (CELEXA) tablet 20 mg  20 mg Oral Daily Laveda AbbeParks, Laurie Britton, NP   20 mg at 08/30/17 0809  . FLUoxetine (PROZAC) capsule 40 mg  40 mg Oral Daily Mariel CraftMaurer, Sheila M, MD   40 mg at 08/30/17 0809  . haloperidol lactate (HALDOL) injection 5 mg  5 mg Intramuscular Q6H PRN Laveda AbbeParks, Laurie Britton, NP      . hydrOXYzine (ATARAX/VISTARIL) tablet 50 mg  50 mg Oral Q6H PRN Laveda AbbeParks, Laurie Britton, NP   50 mg at 08/29/17 2130  . LORazepam (ATIVAN) injection 2 mg  2 mg Intramuscular Q6H PRN Laveda AbbeParks, Laurie Britton, NP      . LORazepam (ATIVAN) tablet 2 mg  2 mg Oral Q6H PRN Laveda AbbeParks, Laurie Britton, NP      . magnesium hydroxide (MILK OF MAGNESIA) suspension 30 mL  30 mL Oral Daily PRN Jackelyn PolingBerry, Jason A, NP      .  Melatonin TABS 3 mg  3 mg Oral q1800 Mariel Craft, MD   3 mg at 08/29/17 1759  . traZODone (DESYREL) tablet 50 mg  50 mg Oral QHS PRN Laveda Abbe, NP   50 mg at 08/29/17 2130   Lab Results:  Results for orders placed or performed during the hospital encounter of 08/28/17 (from the past 48 hour(s))  Hemoglobin A1c     Status: None   Collection Time: 08/29/17  6:30 AM  Result Value Ref Range   Hgb A1c MFr Bld 4.8 4.8 - 5.6 %    Comment: (NOTE) Pre diabetes:          5.7%-6.4% Diabetes:              >6.4% Glycemic control for   <7.0% adults with diabetes    Mean Plasma Glucose 91.06 mg/dL    Comment: Performed at [facility], [address], [place], [place] [address]  Lipid panel     Status: Abnormal   Collection Time: 08/29/17  6:30 AM  Result Value Ref Range   Cholesterol 201 (H) 0 - 200 mg/dL   Triglycerides 93 <604 mg/dL   HDL 47 >54 mg/dL   Total CHOL/HDL Ratio 4.3 RATIO   VLDL 19 0 - 40 mg/dL   LDL Cholesterol 098 (H) 0 -  99 mg/dL    Comment:        Total Cholesterol/HDL:CHD Risk Coronary Heart Disease Risk Table                     Men   Women  1/2 Average Risk   3.4   3.3  Average Risk       5.0   4.4  2 X Average Risk   9.6   7.1  3 X Average Risk  23.4   11.0        Use the calculated Patient Ratio above and the CHD Risk Table to determine the patient's CHD Risk.        ATP III CLASSIFICATION (LDL):  <100     mg/dL   Optimal  119-147  mg/dL   Near or Above                    Optimal  130-159  mg/dL   Borderline  829-562  mg/dL   High  >130     mg/dL   Very High Performed at [facility], [address] [place], [place] [address]   TSH     Status: Abnormal   Collection Time: 08/29/17  6:30 AM  Result Value Ref Range   TSH 5.382 (H) 0.350 - 4.500 uIU/mL    Comment: Performed by a 3rd Generation assay with a functional sensitivity of <=0.01 uIU/mL. Performed at [facility], [address], [place], [place] [address]   Prolactin     Status: Abnormal   Collection Time: 08/29/17  6:30 AM  Result Value Ref Range   Prolactin 93.0 (H) 4.8 - 23.3 ng/mL    Comment: (NOTE) Performed At: [facility] [address] [place], [place] [id number] Jolene Schimke MD [id number]   T4, free     Status: None   Collection Time: 08/29/17  6:38 PM  Result Value Ref Range   Free T4 0.96 0.82 - 1.77 ng/dL    Comment: (NOTE) Biotin ingestion may interfere with free T4 tests. If the results are inconsistent with the TSH level, previous  test results, or the clinical presentation, then consider biotin interference. If needed, order repeat testing after stopping biotin. Performed at [facility], [address], [place], [place] [address]   T3     Status: None   Collection Time: 08/29/17  6:38 PM  Result Value Ref Range   T3, Total 137 71 - 180 ng/dL    Comment: (NOTE) Performed At: [facility] [address] [place], [place]  [id number] Jolene Schimke MD [id number]   T4, free     Status: None   Collection Time: 08/29/17  6:38 PM  Result Value Ref Range   Free T4 1.00 0.82 - 1.77 ng/dL    Comment: (NOTE) Biotin ingestion may interfere with free T4 tests. If the results are inconsistent with the TSH level, previous test results, or the clinical presentation, then consider biotin interference. If needed, order repeat testing after stopping biotin. Performed at [facility], [address], [place], [place] [address]    Blood Alcohol level:  Lab Results  Component Value Date   ETH <10 08/27/2017   ETH <10 08/20/2017   Metabolic Disorder Labs: Lab Results  Component Value Date   HGBA1C 4.8 08/29/2017   MPG 91.06 08/29/2017   Lab Results  Component Value Date   PROLACTIN 93.0 (H) 08/29/2017   Lab Results  Component Value Date   CHOL 201 (H) 08/29/2017   TRIG 93 08/29/2017   HDL 47 08/29/2017   CHOLHDL 4.3 08/29/2017   VLDL 19 08/29/2017   LDLCALC 135 (H) 08/29/2017   LDLCALC 117 (H) 10/26/2010   Physical Findings: AIMS: Facial and Oral Movements Muscles of Facial Expression: None, normal Lips and Perioral Area: None, normal Jaw: None, normal Tongue: None, normal,Extremity Movements Upper (arms, wrists, hands, fingers): None, normal Lower (legs, knees, ankles, toes): None, normal, Trunk Movements Neck, shoulders, hips: None, normal, Overall Severity Severity of abnormal movements (highest score from questions above): None, normal Incapacitation due to abnormal movements: None, normal Patient's awareness of abnormal movements (rate only patient's report): No Awareness, Dental Status Current problems with teeth and/or dentures?: No Does patient usually wear dentures?: No  CIWA:  CIWA-Ar Total: 1 COWS:  COWS Total Score: 2  Musculoskeletal: Strength & Muscle Tone: within normal limits Gait & Station: normal Patient leans: N/A  Psychiatric Specialty Exam: Physical Exam   Nursing note and vitals reviewed.   Review of Systems  Psychiatric/Behavioral: Positive for depression and substance abuse (Hx. THC use). Negative for hallucinations, memory loss and suicidal ideas. The patient is not nervous/anxious and does not have insomnia.     Blood pressure 98/64, pulse 76, temperature 98.6 F (37 C), temperature source Oral, resp. rate 18, height 5\' 2"  (1.575 m), weight 93.9 kg (207 lb), last menstrual period 08/25/2017.Body mass index is 37.86 kg/m.  General Appearance:Casual and Disheveled  Eye Contact:Fair  Speech:Clear and Coherent and Normal Rate  Volume:Normal  Mood:Depressed  Affect:Congruent and Flat  Thought Process:Linear  Orientation:Full (Time, Place, and Person)  Thought Content:Logical and Hallucinations:denies  Suicidal Thoughts:passive SI  Homicidal Thoughts:No  Memory:Immediate;Fair Recent;Fair Remote;Fair  Judgement:Poor  Insight:Shallow  Psychomotor Activity:Normal  Concentration:Concentration:Fairand Attention Span: Fair  Recall:Fair  Fund of Knowledge:Fair  Language:Good  Akathisia:No  AIMS (if indicated):   Assets:Desire for Improvement Housing Social Support  ADL's:Intact  Cognition:WNL  Sleep:Number of Hours: 6.75     Treatment Plan Summary: Daily contact with patient to assess and evaluate symptoms and progress in treatment.  - Continue inpatient hospitalization.  -  Will continue today 08/30/2017 plan as below except where it is noted.  Depression.     - Continue Prozac 40 mg po daily.  Mood control.     - Continue Abilify 5 mg po daily.  Anxiety.     - Continue Hydroxyzine 50 mg po Q 6 hours prn.  Agitation/psychosis.     - Continue Haldol 5 mg IM Q 6 hours prn.     - Lorazepam 2 mg Q 6 hours IM or po prn.  Insomnia.     - Continue Melatonin 3 mg po Q hs (1800).     - Discontinued Trazodone 50 mg po Q hs prn, pt says, not helping.     - Initiated  Vistaril 50 mg po Q hs to aid Melatonin.  Other medical issues.      - Continue Albuterol inhaler 2 puffs Q 6 hours prn.  - Patient to participate in the group sessions. - Discharge disposition plan ongoing.  Armandina Stammer, NP, PMHNP, FNP-BC. 08/30/2017, 11:28 AM

## 2017-08-30 NOTE — BHH Group Notes (Signed)
B[facility] Group Notes:  (Nursing)  Date:  08/30/2017  Time: 130 PM Type of Therapy:  Nurse Education  Participation Level:  Minimal  Participation Quality:  Appropriate  Affect:  Appropriate  Cognitive:  Appropriate  Insight:  Improving  Engagement in Group:  Improving  Modes of Intervention:  Education and Exploration  Summary of Progress/Problems: Nurse led group on anger management. Renee [name] 08/30/2017, 3:17 PM

## 2017-08-30 NOTE — Progress Notes (Signed)
Patient did not attend evening group session.  

## 2017-08-30 NOTE — BHH Group Notes (Signed)
[facility] Group Notes: (Clinical Social Work)   08/30/2017      Type of Therapy:  Group Therapy   Participation Level:  Did Not Attend despite MHT prompting   Latiffany Harwick N Williom Cedar, LCSW  08/30/2017 1:11 PM   

## 2017-08-30 NOTE — Progress Notes (Signed)
D. Pt presents with a flat affect and depressed mood. Pt reports having slept poorly last night- endorses low energy. Pt reports an improvement in mood- per pt's self inventory, pt rates her depression, hopelessness and anxiety all 0's. Pt currently denies SI/HI and AV hallucinations A. Labs and vitals monitored. Pt compliant with medications. Pt supported emotionally and encouraged to express concerns and ask questions.   R. Pt remains safe with 15 minute checks. Will continue POC.

## 2017-08-31 NOTE — Progress Notes (Signed)
[facility]  08/31/2017 11:29 AM Renee [name] T Tennant  MRN:  [id number]  Subjective: Renee [name] reports, "I'mdoing well. My mood is good. I'm not feeling depressed today. I don't have any anxiety symptoms today. I slept very well last night.  I did attend some group session yesterday. I came to the hospital because I was depressed & stressed. I feel fine now. I need to be discharged".    Hadley T Robertsonis a 22 y.o.femalewho [facility] with complaint of depressed mood, passive suicidal ideation, and other depressive symptoms. Pt was accompanied by her brother and mother. [place] in [place] with her mother. Per report, she attends college and also works. She has never received any outpatient or inpatient psychiatric/therapy care. Pt has not been assessed by TTS before. History was provided by Pt and Pt's mother. Pt reported that she has experienced the following symptoms over the last two months: Passive suicidal ideation (no Despondency; poor sleep (5 hours per night); lowered appetite (lost 15 lbs over two months); feelings of worthlessness and hopelessness; fatigue. Pt also endorsed recent stressor -- she recently lost a job and may lose another because she is lethargic and avolitional. Pt denied past suicide attempt, homicidal ideation, hallucination, self-injurious behavior, and substance use. Pt appeared apathetic and lethargic. Her responses were ambiguous. Chartered loss adjusterAuthor spoke with mother separately. Mother reported that she is increasingly concerned about Pt because Pt cut her wrist last week (superficial cut, no sutures/stitches required), because Pt is endorsing passive suicidal ideation, and because Pt appears to be responding to internal stimuli. Mother requested inpatient treatment or an overnight observation. Pt denied trauma, reported that she felt safe at home, and denied current stressors. ''I don't know... I don't feel good or like doing  anything."  Renee [name] is seen, chart reviewed. The chart findings discussed with the treatment team. She is again lying down in her bed. She says she slept well last night. She says her mood is getting better. She is taking & tolerating her treatment regimen. Denies any adverse effects. Denies any anxiety symptoms. She currently denies any SIHI, AVH, delusional thoughts or paranoia.  Discontinued Trazodone 50 mg, initiated Hydroxyzine 50 mg po Q hs. Renee [name] has agreed to continue her current plan of care already in progress. She is asking to be discharged today.  Principal Problem: Severe recurrent major depression with psychotic features [facility](HCC)  Diagnosis:   Patient Active Problem List   Diagnosis Date Noted  . 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 Marijuana abuse [F12.10] 08/29/2017  . Severe recurrent major depression with psychotic features (HCC) [F33.3] 08/28/2017  . Involuntary commitment [Z04.6]   . Schizoaffective disorder (HCC) [F25.9]   . Depression [F32.9] 08/21/2017  . Severe episode of recurrent major depressive disorder, without psychotic features (HCC) [F33.2] 08/20/2017  . Asthma with exacerbation [J45.901] 04/26/2014  . Heart palpitations [R00.2] 12/15/2013  . Blurred vision, bilateral [H53.8] 12/15/2013  . Decreased visual acuity [H54.7] 12/15/2013  . Generalized anxiety disorder [F41.1] 12/15/2013  . Tension headache [G44.209] 12/15/2013  . Encounter for sexually transmitted disease counseling [Z70.8] 12/29/2012  . General counseling and advice on female contraception [Z30.09] 12/29/2012  . Paresthesias [R20.2] 08/05/2012  . Tongue lesion [K14.8] 08/05/2012  . ASTHMA, PERSISTENT [J45.909] 06/22/2008  . CHILDHOOD OBESITY [E66.9] 08/27/2007  . 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 ALLERGIC RHINITIS [J30.9] 06/20/2006   Total Time spent with patient: 15 minutes  Past Psychiatric History: See H&P.  Past Medical History:  Past  Medical History:  Diagnosis Date  . Anxiety   . Asthma    History reviewed. No pertinent surgical history. Family History: History reviewed. No pertinent family history.  Family Psychiatric  History: See H&P  Social History:  Social History   Substance and Sexual Activity  Alcohol Use No     Social History   Substance and Sexual Activity  Drug Use No    Social History   Socioeconomic History  . Marital status: Single    Spouse name: Not on file  . Number of children: Not on file  . Years of education: Not on file  . Highest education level: Not on file  Occupational History  . Not on file  Social Needs  . Financial resource strain: Not on file  . Food insecurity:    Worry: Not on file    Inability: Not on file  . Transportation needs:    Medical: Not on file    Non-medical: Not on file  Tobacco Use  . Smoking status: Never Smoker  . Smokeless tobacco: Never Used  Substance and Sexual Activity  . Alcohol use: No  . Drug use: No  . Sexual activity: Never  Lifestyle  . Physical activity:    Days per week: Not on file    Minutes per session: Not on file  . Stress: Not on file  Relationships  . Social connections:    Talks on phone: Not on file    Gets together: Not on file    Attends religious service: Not on file    Active member of club or organization: Not on file    Attends meetings of clubs or organizations: Not on file    Relationship status: Not on file  Other Topics Concern  . Not on file  Social History Narrative  . Not on file   Additional Social History:   Sleep: Good  Appetite:  Good  Current Medications: Current Facility-Administered Medications  Medication Dose Route Frequency Provider Last Rate Last Dose  . acetaminophen (TYLENOL) tablet 650 mg  650 mg Oral Q6H PRN Nira Conn A, NP   650 mg at 08/29/17 0933  . albuterol (PROVENTIL HFA;VENTOLIN HFA) 108 (90 Base) MCG/ACT inhaler 2 puff  2 puff Inhalation Q6H Laveda Abbe,  NP   2 puff at 08/29/17 1638  . alum & mag hydroxide-simeth (MAALOX/MYLANTA) 200-200-20 MG/5ML suspension 30 mL  30 mL Oral Q4H PRN Nira Conn A, NP      . ARIPiprazole (ABILIFY) tablet 5 mg  5 mg Oral Daily Mariel Craft, MD   5 mg at 08/31/17 0755  . FLUoxetine (PROZAC) capsule 40 mg  40 mg Oral Daily Mariel Craft, MD   40 mg at 08/31/17 0755  . haloperidol lactate (HALDOL) injection 5 mg  5 mg Intramuscular Q6H PRN Laveda Abbe, NP      . hydrOXYzine (ATARAX/VISTARIL) tablet 50 mg  50 mg Oral Q6H PRN Laveda Abbe, NP   50 mg at 08/29/17 2130  . hydrOXYzine (ATARAX/VISTARIL) tablet 50 mg  50 mg Oral QHS Armandina Stammer I, NP   50 mg at 08/30/17 2138  . LORazepam (ATIVAN) injection 2 mg  2 mg Intramuscular Q6H PRN Laveda Abbe, NP      . LORazepam (ATIVAN) tablet 2 mg  2 mg Oral Q6H PRN Laveda Abbe, NP      . magnesium hydroxide (MILK OF MAGNESIA) suspension 30 mL  30 mL Oral Daily PRN  Jackelyn Poling, NP      . Melatonin TABS 3 mg  3 mg Oral q1800 Mariel Craft, MD   3 mg at 08/30/17 2139   Lab Results:  Results for orders placed or performed during the hospital encounter of 08/28/17 (from the past 48 hour(s))  T4, free     Status: None   Collection Time: 08/29/17  6:38 PM  Result Value Ref Range   Free T4 0.96 0.82 - 1.77 ng/dL    Comment: (NOTE) Biotin ingestion may interfere with free T4 tests. If the results are inconsistent with the TSH level, previous test results, or the clinical presentation, then consider biotin interference. If needed, order repeat testing after stopping biotin. Performed at [facility], [address] [place], [place] [address]   T3     Status: None   Collection Time: 08/29/17  6:38 PM  Result Value Ref Range   T3, Total 137 71 - 180 ng/dL    Comment: (NOTE) Performed At: [facility] [address] [place], [place] [id number] Jolene Schimke MD [id number]:[id number]   T4, free     Status: None    Collection Time: 08/29/17  6:38 PM  Result Value Ref Range   Free T4 1.00 0.82 - 1.77 ng/dL    Comment: (NOTE) Biotin ingestion may interfere with free T4 tests. If the results are inconsistent with the TSH level, previous test results, or the clinical presentation, then consider biotin interference. If needed, order repeat testing after stopping biotin. Performed at [facility], [address], [place], [place] [address]    Blood Alcohol level:  Lab Results  Component Value Date   ETH <10 08/27/2017   ETH <10 08/20/2017   Metabolic Disorder Labs: Lab Results  Component Value Date   HGBA1C 4.8 08/29/2017   MPG 91.06 08/29/2017   Lab Results  Component Value Date   PROLACTIN 93.0 (H) 08/29/2017   Lab Results  Component Value Date   CHOL 201 (H) 08/29/2017   TRIG 93 08/29/2017   HDL 47 08/29/2017   CHOLHDL 4.3 08/29/2017   VLDL 19 08/29/2017   LDLCALC 135 (H) 08/29/2017   LDLCALC 117 (H) 10/26/2010   Physical Findings: AIMS: Facial and Oral Movements Muscles of Facial Expression: None, normal Lips and Perioral Area: None, normal Jaw: None, normal Tongue: None, normal,Extremity Movements Upper (arms, wrists, hands, fingers): None, normal Lower (legs, knees, ankles, toes): None, normal, Trunk Movements Neck, shoulders, hips: None, normal, Overall Severity Severity of abnormal movements (highest score from questions above): None, normal Incapacitation due to abnormal movements: None, normal Patient's awareness of abnormal movements (rate only patient's report): No Awareness, Dental Status Current problems with teeth and/or dentures?: No Does patient usually wear dentures?: No  CIWA:  CIWA-Ar Total: 1 COWS:  COWS Total Score: 2  Musculoskeletal: Strength & Muscle Tone: within normal limits Gait & Station: normal Patient leans: N/A  Psychiatric Specialty Exam: Physical Exam  Nursing note and vitals reviewed.   Review of Systems  Psychiatric/Behavioral:  Positive for depression and substance abuse (Hx. THC use). Negative for hallucinations, memory loss and suicidal ideas. The patient is not nervous/anxious and does not have insomnia.     Blood pressure 123/79, pulse (!) 115, temperature 98.7 F (37.1 C), temperature source Oral, resp. rate 20, height 5\' 2"  (1.575 m), weight 93.9 kg (207 lb), last menstrual period 08/25/2017.Body mass index is 37.86 kg/m.  General Appearance:Casual and Disheveled  Eye Contact:Fair  Speech:Clear and  Coherent and Normal Rate  Volume:Normal  Mood:Depressed  Affect:Congruent and Flat  Thought Process:Linear  Orientation:Full (Time, Place, and Person)  Thought Content:Logical and Hallucinations:denies  Suicidal Thoughts:passive SI  Homicidal Thoughts:No  Memory:Immediate;Fair Recent;Fair Remote;Fair  Judgement:Poor  Insight:Shallow  Psychomotor Activity:Normal  Concentration:Concentration:Fairand Attention Span: Fair  Recall:Fair  Fund of Knowledge:Fair  Language:Good  Akathisia:No  AIMS (if indicated):   Assets:Desire for Improvement Housing Social Support  ADL's:Intact  Cognition:WNL  Sleep:Number of Hours: 6.75     Treatment Plan Summary: Daily contact with patient to assess and evaluate symptoms and progress in treatment.  - Continue inpatient hospitalization.  - Will continue today 08/31/2017 plan as below except where it is noted.  Depression.     - Continue Prozac 40 mg po daily.  Mood control.     - Continue Abilify 5 mg po daily.  Anxiety.     - Continue Hydroxyzine 50 mg po Q 6 hours prn.  Agitation/psychosis.     - Continue Haldol 5 mg IM Q 6 hours prn.     - Lorazepam 2 mg Q 6 hours IM or po prn.  Insomnia.     - Continue Melatonin 3 mg po Q hs (1800).     - Discontinued Trazodone 50 mg po Q hs prn, pt says, not helping.     - Continue Vistaril 50 mg po Q hs to aid Melatonin.  Other medical issues.      -  Continue Albuterol inhaler 2 puffs Q 6 hours prn.  - Patient to participate in the group sessions. - Discharge disposition plan ongoing.  Armandina Stammer, NP, PMHNP, FNP-BC. 08/31/2017, 11:29 AMPatient ID: Renee [name], female   DOB: 12-Feb-1995, 22 y.o.   MRN: [id number]

## 2017-08-31 NOTE — [facility] Group Notes (Signed)
[facility] LCSW Group Therapy Note  08/31/2017  11:00-11:40AM  Type of Therapy and Topic:  Group Therapy:  Building Supports  Participation Level:  Minimal   Description of Group:  Patients in this group were introduced to the idea of adding a variety of healthy supports to address the various needs in their lives.  Different types of support were defined and described, and patients were asked to act out what each type could be.  Patients discussed what additional healthy supports could be helpful in their recovery and wellness after discharge in order to prevent future hospitalizations.   An emphasis was placed on following up with the discharge plan when they leave the hospital in order to continue becoming healthier and happier.  Therapeutic Goals: 1)  demonstrate the importance of adding supports  2)  discuss 4 definitions of support  3)  identify the patient's current level of healthy support and   4)  elicit commitments to add one healthy support   Summary of Patient Progress:  Patient entered the dayroom as group was ending. Patient did report mother and brother are supports for patient.   Therapeutic Modalities:   Motivational Interviewing Brief Solution-Focused Therapy  Shellia CleverlyStephanie N Madie Cahn

## 2017-08-31 NOTE — Progress Notes (Signed)
D. Pt continues to present with a flat affect, but brightens a little during interactions. Pt is calm and cooperative, but remains isolative to her room. Per pt's self inventory, pt rates her depression, hopelessness and anxiety all 0's. Pt currently denies SI/HI and AV hallucinations.A. Labs and vitals monitored. Pt compliant with medications- but continues to refuse inhaler, stating that "I haven't been moving around, so I don't need it." Pt supported emotionally and encouraged to express concerns and ask questions.   R. Pt remains safe with 15 minute checks. Will continue POC.

## 2017-08-31 NOTE — [facility] Group Notes (Signed)
[facility] Group Notes:  (Nursing/MHT/Case Management/Adjunct)  Date:  08/31/2017  Time:  2:05 PM  Type of Therapy:  Nurse Education  Participation Level:  Active  Participation Quality:  Appropriate and Attentive  Affect:  Appropriate  Cognitive:  Alert and Appropriate  Insight:  Good  Engagement in Group:  Engaged  Modes of Intervention:  Activity  Summary of Progress/Problems: This group consisted of playing a card game, called "ungame." In this game, patients pick a card and read the question listed on it. They then give an example or tell a story answering the question. It is designed as a Environmental health practitionerconversation starter. The patient shared several examples and answered questions appropriately.  Kirstie MirzaJonathan C Travion Ke 08/31/2017, 2:05 PM

## 2017-08-31 NOTE — Plan of Care (Signed)
  Problem: Education: Goal: Emotional status will improve Outcome: Progressing Goal: Mental status will improve Outcome: Progressing   Problem: Coping: Goal: Coping ability will improve Outcome: Progressing Goal: Will verbalize feelings Outcome: Progressing  Patient has been observed in the milieu and actively engaged in group this afternnoon

## 2017-08-31 NOTE — Progress Notes (Signed)
Patient ID: Renee [name], female   DOB: 07/06/1995, 22 y.o.   MRN: [id number] DAR Note: Pt remained flat and withdrawn to her room; remained in bed all evening. Pt denied AVH however; Pt observed laughing in appropriately. When asked about the reasons she was laughing Pt replied, "I just thought about something real quick." Pt also denied anxiety, depression, SI, HI or pain. All patient's questions and concerns addressed. Support, encouragement, and safe environment provided. 15-minute safety checks continue. Safety checks continue. Pt continue to be med compliant.

## 2017-09-01 NOTE — Progress Notes (Signed)
[facility] MD Progress Note  09/01/2017 7:22 PM Renee [name]  MRN:  [id number] Subjective:    Renee Rias T Robertsonis a 22 y.o.femalewho presented to [facility] on voluntary basis with complaint of depressed mood, passive suicidal ideation, and other depressive symptoms.    As per chart review: Pt was accompanied by her brother and mother. Pt lives in [place] with her mother. Per report, she attends college and also works. She has never received any outpatient or inpatient psychiatric/therapy care. Pt has not been assessed by TTS before. History was provided by Pt and Pt's mother. Pt reported that she has experienced the following symptoms over the last two months: Passive suicidal ideation (no Despondency; poor sleep (5 hours per night); lowered appetite (lost 15 lbs over two months); feelings of worthlessness and hopelessness; fatigue. Pt also endorsed recent stressor -- she recently lost a job and may lose another because she is lethargic and avolitional. Pt denied past suicide attempt, homicidal ideation, hallucination, self-injurious behavior, and substance use. Pt appeared apathetic and lethargic. Her responses were ambiguous. Chartered loss adjuster spoke with mother separately. Mother reported that she is increasingly concerned about Pt because Pt cut her wrist last week (superficial cut, no sutures/stitches required), because Pt is endorsing passive suicidal ideation, and because Pt appears to be responding to internal stimuli. Mother requested inpatient treatment or an overnight observation. Pt denied trauma, reported that she felt safe at home, and denied current stressors. ''I don't know... I don't feel good or like doing anything."  As per evaluation today: Pt shares, "My mom said I was hearing voices," regarding her time of admission. Pt cites stressors of her work and and school as the main factors contributing her recent mood and psychotic symptoms. She reports that overall she is feeling  significantly better. She denies SI/HI/AH/VH. She is sleeping well. Her appetite is good. She is tolerating her medications well. She is in agreement to continue her current regimen without changes. She agrees to sign in voluntarily.   Principal Problem: Severe recurrent major depression with psychotic features [facility]) Diagnosis:   Patient Active Problem List   Diagnosis Date Noted  . 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 Marijuana abuse [F12.10] 08/29/2017  . Severe recurrent major depression with psychotic features (HCC) [F33.3] 08/28/2017  . Involuntary commitment [Z04.6]   . Schizoaffective disorder (HCC) [F25.9]   . Depression [F32.9] 08/21/2017  . Severe episode of recurrent major depressive disorder, without psychotic features (HCC) [F33.2] 08/20/2017  . Asthma with exacerbation [J45.901] 04/26/2014  . Heart palpitations [R00.2] 12/15/2013  . Blurred vision, bilateral [H53.8] 12/15/2013  . Decreased visual acuity [H54.7] 12/15/2013  . Generalized anxiety disorder [F41.1] 12/15/2013  . Tension headache [G44.209] 12/15/2013  . Encounter for sexually transmitted disease counseling [Z70.8] 12/29/2012  . General counseling and advice on female contraception [Z30.09] 12/29/2012  . Paresthesias [R20.2] 08/05/2012  . Tongue lesion [K14.8] 08/05/2012  . ASTHMA, PERSISTENT [J45.909] 06/22/2008  . CHILDHOOD OBESITY [E66.9] 08/27/2007  . 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 ALLERGIC RHINITIS [J30.9] 06/20/2006   Total Time spent with patient: 30 minutes  Past Psychiatric History: see H&P  Past Medical History:  Past Medical History:  Diagnosis Date  . Anxiety   . Asthma    History reviewed. No pertinent surgical history. Family History: History reviewed. No pertinent family history. Family Psychiatric  History: see H&P Social History:  Social History   Substance and Sexual Activity  Alcohol Use No     Social History   Substance  and  Sexual Activity  Drug Use No    Social History   Socioeconomic History  . Marital status: Single    Spouse name: Not on file  . Number of children: Not on file  . Years of education: Not on file  . Highest education level: Not on file  Occupational History  . Not on file  Social Needs  . Financial resource strain: Not on file  . Food insecurity:    Worry: Not on file    Inability: Not on file  . Transportation needs:    Medical: Not on file    Non-medical: Not on file  Tobacco Use  . Smoking status: Never Smoker  . Smokeless tobacco: Never Used  Substance and Sexual Activity  . Alcohol use: No  . Drug use: No  . Sexual activity: Never  Lifestyle  . Physical activity:    Days per week: Not on file    Minutes per session: Not on file  . Stress: Not on file  Relationships  . Social connections:    Talks on phone: Not on file    Gets together: Not on file    Attends religious service: Not on file    Active member of club or organization: Not on file    Attends meetings of clubs or organizations: Not on file    Relationship status: Not on file  Other Topics Concern  . Not on file  Social History Narrative  . Not on file   Additional Social History:                         Sleep: Good  Appetite:  Good  Current Medications: Current Facility-Administered Medications  Medication Dose Route Frequency Provider Last Rate Last Dose  . acetaminophen (TYLENOL) tablet 650 mg  650 mg Oral Q6H PRN Nira Conn A, NP   650 mg at 08/29/17 0933  . albuterol (PROVENTIL HFA;VENTOLIN HFA) 108 (90 Base) MCG/ACT inhaler 2 puff  2 puff Inhalation Q6H Laveda Abbe, NP   2 puff at 09/01/17 1500  . alum & mag hydroxide-simeth (MAALOX/MYLANTA) 200-200-20 MG/5ML suspension 30 mL  30 mL Oral Q4H PRN Nira Conn A, NP      . ARIPiprazole (ABILIFY) tablet 5 mg  5 mg Oral Daily Mariel Craft, MD   5 mg at 09/01/17 1610  . FLUoxetine (PROZAC) capsule 40 mg  40 mg Oral  Daily Mariel Craft, MD   40 mg at 09/01/17 9604  . haloperidol lactate (HALDOL) injection 5 mg  5 mg Intramuscular Q6H PRN Laveda Abbe, NP      . hydrOXYzine (ATARAX/VISTARIL) tablet 50 mg  50 mg Oral Q6H PRN Laveda Abbe, NP   50 mg at 08/29/17 2130  . hydrOXYzine (ATARAX/VISTARIL) tablet 50 mg  50 mg Oral QHS Armandina Stammer I, NP   50 mg at 08/31/17 2149  . LORazepam (ATIVAN) injection 2 mg  2 mg Intramuscular Q6H PRN Laveda Abbe, NP      . LORazepam (ATIVAN) tablet 2 mg  2 mg Oral Q6H PRN Laveda Abbe, NP      . magnesium hydroxide (MILK OF MAGNESIA) suspension 30 mL  30 mL Oral Daily PRN Jackelyn Poling, NP      . Melatonin TABS 3 mg  3 mg Oral q1800 Mariel Craft, MD   3 mg at 09/01/17 1824    Lab Results: No results found for this  or any previous visit (from the past 48 hour(s)).  Blood Alcohol level:  Lab Results  Component Value Date   ETH <10 08/27/2017   ETH <10 08/20/2017    Metabolic Disorder Labs: Lab Results  Component Value Date   HGBA1C 4.8 08/29/2017   MPG 91.06 08/29/2017   Lab Results  Component Value Date   PROLACTIN 93.0 (H) 08/29/2017   Lab Results  Component Value Date   CHOL 201 (H) 08/29/2017   TRIG 93 08/29/2017   HDL 47 08/29/2017   CHOLHDL 4.3 08/29/2017   VLDL 19 08/29/2017   LDLCALC 135 (H) 08/29/2017   LDLCALC 117 (H) 10/26/2010    Physical Findings: AIMS: Facial and Oral Movements Muscles of Facial Expression: None, normal Lips and Perioral Area: None, normal Jaw: None, normal Tongue: None, normal,Extremity Movements Upper (arms, wrists, hands, fingers): None, normal Lower (legs, knees, ankles, toes): None, normal, Trunk Movements Neck, shoulders, hips: None, normal, Overall Severity Severity of abnormal movements (highest score from questions above): None, normal Incapacitation due to abnormal movements: None, normal Patient's awareness of abnormal movements (rate only patient's report): No  Awareness, Dental Status Current problems with teeth and/or dentures?: No Does patient usually wear dentures?: No  CIWA:  CIWA-Ar Total: 1 COWS:  COWS Total Score: 2  Musculoskeletal: Strength & Muscle Tone: within normal limits Gait & Station: normal Patient leans: N/A  Psychiatric Specialty Exam: Physical Exam  Nursing note and vitals reviewed.   Review of Systems  Constitutional: Negative for chills and fever.  Respiratory: Negative for cough and shortness of breath.   Cardiovascular: Negative for chest pain.  Gastrointestinal: Negative for abdominal pain, heartburn, nausea and vomiting.  Psychiatric/Behavioral: Negative for depression, hallucinations and suicidal ideas. The patient is not nervous/anxious and does not have insomnia.     Blood pressure (!) 124/95, pulse (!) 122, temperature 98.9 F (37.2 C), resp. rate 20, height 5\' 2"  (1.575 m), weight 93.9 kg (207 lb), last menstrual period 08/25/2017.Body mass index is 37.86 kg/m.  General Appearance: Casual  Eye Contact:  Good  Speech:  Clear and Coherent and Normal Rate  Volume:  Normal  Mood:  Euthymic  Affect:  Appropriate and Congruent  Thought Process:  Coherent and Goal Directed  Orientation:  Full (Time, Place, and Person)  Thought Content:  Logical  Suicidal Thoughts:  No  Homicidal Thoughts:  No  Memory:  Immediate;   Fair Recent;   Fair Remote;   Fair  Judgement:  Fair  Insight:  Fair  Psychomotor Activity:  Normal  Concentration:  Concentration: Fair  Recall:  [employer] of Knowledge:  Fair  Language:  Fair  Akathisia:  No  Handed:    AIMS (if indicated):     Assets:  Communication Skills Resilience Social Support  ADL's:  Intact  Cognition:  WNL  Sleep:  Number of Hours: 6.75     Treatment Plan Summary: Daily contact with patient to assess and evaluate symptoms and progress in treatment and Medication management   - Continue inpatient hospitalization.  - Will continue today 09/01/2017  plan as below except where it is noted.  Depression.     - Continue Prozac 40 mg po daily.  Mood control.     - Continue Abilify 5 mg po daily.  Anxiety.     - Continue Hydroxyzine 50 mg po Q 6 hours prn.  Agitation/psychosis.     - Continue Haldol 5 mg IM Q 6 hours prn.     -  Lorazepam 2 mg Q 6 hours IM or po prn.  Insomnia.     - Continue Melatonin 3 mg po Q hs (1800).     - Continue Vistaril 50 mg po Q hs to aid Melatonin.  Other medical issues.      - Continue Albuterol inhaler 2 puffs Q 6 hours prn.   - Patient to participate in the group sessions. - Discharge disposition plan ongoing.   Micheal Likenshristopher T Ladesha Pacini, MD 09/01/2017, 7:22 PM

## 2017-09-01 NOTE — Plan of Care (Signed)
Problem: Safety: Goal: Periods of time without injury will increase Outcome: Progressing   Problem: Safety: Goal: Periods of time without injury will increase Outcome: Progressing   Problem: Nutritional: Goal: Ability to achieve adequate nutritional intake will improve Outcome: Progressing D: Pt awake in bed at this time. Stayed in room most of this shift. Presents with flat affect and depressed mood. Denies SI, HI, AVH and pain. Pt did not attend group this morning despite multiple prompts. Pt attended noon group and was engaged minimally. Rates her depression, anxiety and hopelessness all 0/10. Pt's goal this shift is "to get out". Compliant with medications when offered.   A: Emotional support offered to pt throughout this shift. Encouraged pt to voice concerns and comply with current treatment regimen including unit groups as scheduled. All medications given as ordered with verbal education and effects monitored. Safety checks maintained at Q 15 minutes intervals without self harm gestures or outburst. R: Seen in milieu at brief intervals. Denies adverse drug reactions with medications. Tolerates all PO intake well. Remains safe on and off unit.

## 2017-09-01 NOTE — Progress Notes (Signed)
Recreation Therapy Notes  Date: 8.5.19 Time: 1000 Location: 500 Hall Dayroom  Group Topic: Triggers  Goal Area(s) Addresses:  Patient will identify triggers. Patient will identify what strategies they use to avoid triggers Patient will  Identify how they face triggers head on.  Intervention:  Worksheet  Activity: Triggers.  Patients were given [name] worksheet on triggers.  Patients were to identify their triggers.  They were then to address how they avoid their triggers and how they face them head on when they can't be avoided.  Education:  Triggers, Discharge Planning  Education Outcome: Acknowledges understanding/In group clarification offered/Needs additional education.   Clinical Observations/Feedback: Pt did not attend group.    Renee [name], LRT/CTRS         Renee [name] 09/01/2017 12:26 PM 

## 2017-09-01 NOTE — Progress Notes (Signed)
Pt did not attend wrap-up group   
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
## 2017-09-01 NOTE — Progress Notes (Signed)
Patient ID: Renee [name], female   DOB: 04/24/1995, 22 y.o.   MRN: [id number] DAR Note: Pt remained flat and withdrawn to her room; remained in bed all evening. Pt anxiety, depression, SI, HI or pain. All patient's questions and concerns addressed. Support, encouragement, and safe environment provided. 15-minute safety checks continue. Safety checks continue. Pt continue to be med compliant.

## 2017-09-02 NOTE — Progress Notes (Signed)
Nursing note 7p-7a  Pt observed isolating in room most of this shift. Displayed a flat affect and anxious mood upon interaction with this Clinical research associatewriter. Pt denies pain ,denies SI/HI, and also denies any audio or visual hallucinations at this time. Pt is able to verbally contract for safety with this RN. Goal:  To discharge tomorrow. Pt refused Albuterol @2100 , stated that she did not need it at this time. Pt is now resting in bed with eyes closed, with no signs or symptoms of pain or distress noted. Pt continues to remain safe on the unit and is observed by rounding every 15 min. RN will continue to monitor.

## 2017-09-02 NOTE — [facility] Suicide Risk Assessment (Signed)
[facility] INPATIENT:  Family/Significant Other Suicide Prevention Education  Suicide Prevention Education:  Education Completed; Mother, Raynelle DickDenise Jayne,[phone] has been identified by the patient as the family member/significant other with whom the patient will be residing, and identified as the person(s) who will aid the patient in the event of a mental health crisis (suicidal ideations/suicide attempt).  With written consent from the patient, the family member/significant other has been provided the following suicide prevention education, prior to the and/or following the discharge of the patient.  The suicide prevention education provided includes the following:  Suicide risk factors  Suicide prevention and interventions  National Suicide Hotline telephone number  [facility] assessment telephone number  [facility] Emergency Assistance 911  [facility] and/or Residential Mobile Crisis Unit telephone number  Request made of family/significant other to:  Remove weapons (e.g., guns, rifles, knives), all items previously/currently identified as safety concern.    Remove drugs/medications (over-the-counter, prescriptions, illicit drugs), all items previously/currently identified as a safety concern.  The family member/significant other verbalizes understanding of the suicide prevention education information provided.  The family member/significant other agrees to remove the items of safety concern listed above.  Ida RogueRodney B Ansh Fauble 09/02/2017, 9:22 AM

## 2017-09-02 NOTE — Progress Notes (Signed)
[facility] MD Progress Note  09/02/2017 5:23 PM Renee [name]  MRN:  [id number] Subjective:    Renee [name] a 22 y.o.femalewho presented to [facility] on voluntary basis with complaint of depressed mood, passive suicidal ideation, and other depressive symptoms.    As per chart review: Pt was accompanied by her brother and mother. Pt lives in [place] with her mother. Per report, she attends college and also works. She has never received any outpatient or inpatient psychiatric/therapy care. Pt has not been assessed by TTS before. History was provided by Pt and Pt's mother. Pt reported that she has experienced the following symptoms over the last two months: Passive suicidal ideation (no Despondency; poor sleep (5 hours per night); lowered appetite (lost 15 lbs over two months); feelings of worthlessness and hopelessness; fatigue. Pt also endorsed recent stressor -- she recently lost a job and may lose another because she is lethargic and avolitional. Pt denied past suicide attempt, homicidal ideation, hallucination, self-injurious behavior, and substance use. Pt appeared apathetic and lethargic. Her responses were ambiguous. Chartered loss adjuster spoke with mother separately. Mother reported that she is increasingly concerned about Pt because Pt cut her wrist last week (superficial cut, no sutures/stitches required), because Pt is endorsing passive suicidal ideation, and because Pt appears to be responding to internal stimuli. Mother requested inpatient treatment or an overnight observation. Pt denied trauma, reported that she felt safe at home, and denied current stressors. ''I don't know... I don't feel good or like doing anything."  As per evaluation today: Pt shares, "I'm doing good." She denies any specific concerns today. She denies SI/HI/AH/VH. She is sleeping well. Her appetite is good. She is tolerating her medications well. She is in agreement to continue her current regimen  without changes. She feels that she would be safe to discharge to home, and we discussed that we will plan to reach out to her mother for collateral information and to verify there are no safety concerns with discharge with potential discharge as soon as tomorrow, and pt was in agreement with that plan.  Principal Problem: Severe recurrent major depression with psychotic features [facility]) Diagnosis:   Patient Active Problem List   Diagnosis Date Noted  . 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 Marijuana abuse [F12.10] 08/29/2017  . Severe recurrent major depression with psychotic features (HCC) [F33.3] 08/28/2017  . Involuntary commitment [Z04.6]   . Schizoaffective disorder (HCC) [F25.9]   . Depression [F32.9] 08/21/2017  . Severe episode of recurrent major depressive disorder, without psychotic features (HCC) [F33.2] 08/20/2017  . Asthma with exacerbation [J45.901] 04/26/2014  . Heart palpitations [R00.2] 12/15/2013  . Blurred vision, bilateral [H53.8] 12/15/2013  . Decreased visual acuity [H54.7] 12/15/2013  . Generalized anxiety disorder [F41.1] 12/15/2013  . Tension headache [G44.209] 12/15/2013  . Encounter for sexually transmitted disease counseling [Z70.8] 12/29/2012  . General counseling and advice on female contraception [Z30.09] 12/29/2012  . Paresthesias [R20.2] 08/05/2012  . Tongue lesion [K14.8] 08/05/2012  . ASTHMA, PERSISTENT [J45.909] 06/22/2008  . CHILDHOOD OBESITY [E66.9] 08/27/2007  . 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 ALLERGIC RHINITIS [J30.9] 06/20/2006   Total Time spent with patient: 30 minutes  Past Psychiatric History: see H&P  Past Medical History:  Past Medical History:  Diagnosis Date  . Anxiety   . Asthma    History reviewed. No pertinent surgical history. Family History: History reviewed. No pertinent family history. Family Psychiatric  History: see H&P Social History:  Social History   Substance and Sexual  Activity  Alcohol Use No     Social History   Substance and Sexual Activity  Drug Use No    Social History   Socioeconomic History  . Marital status: Single    Spouse name: Not on file  . Number of children: Not on file  . Years of education: Not on file  . Highest education level: Not on file  Occupational History  . Not on file  Social Needs  . Financial resource strain: Not on file  . Food insecurity:    Worry: Not on file    Inability: Not on file  . Transportation needs:    Medical: Not on file    Non-medical: Not on file  Tobacco Use  . Smoking status: Never Smoker  . Smokeless tobacco: Never Used  Substance and Sexual Activity  . Alcohol use: No  . Drug use: No  . Sexual activity: Never  Lifestyle  . Physical activity:    Days per week: Not on file    Minutes per session: Not on file  . Stress: Not on file  Relationships  . Social connections:    Talks on phone: Not on file    Gets together: Not on file    Attends religious service: Not on file    Active member of club or organization: Not on file    Attends meetings of clubs or organizations: Not on file    Relationship status: Not on file  Other Topics Concern  . Not on file  Social History Narrative  . Not on file   Additional Social History:                         Sleep: Good  Appetite:  Good  Current Medications: Current Facility-Administered Medications  Medication Dose Route Frequency Provider Last Rate Last Dose  . acetaminophen (TYLENOL) tablet 650 mg  650 mg Oral Q6H PRN Nira ConnBerry, Jason A, NP   650 mg at 08/29/17 0933  . albuterol (PROVENTIL HFA;VENTOLIN HFA) 108 (90 Base) MCG/ACT inhaler 2 puff  2 puff Inhalation Q6H Laveda AbbeParks, Laurie Britton, NP   2 puff at 09/01/17 1500  . alum & mag hydroxide-simeth (MAALOX/MYLANTA) 200-200-20 MG/5ML suspension 30 mL  30 mL Oral Q4H PRN Nira ConnBerry, Jason A, NP      . ARIPiprazole (ABILIFY) tablet 5 mg  5 mg Oral Daily Mariel CraftMaurer, Sheila M, MD   5 mg at  09/02/17 0805  . FLUoxetine (PROZAC) capsule 40 mg  40 mg Oral Daily Mariel CraftMaurer, Sheila M, MD   40 mg at 09/02/17 0805  . haloperidol lactate (HALDOL) injection 5 mg  5 mg Intramuscular Q6H PRN Laveda AbbeParks, Laurie Britton, NP      . hydrOXYzine (ATARAX/VISTARIL) tablet 50 mg  50 mg Oral Q6H PRN Laveda AbbeParks, Laurie Britton, NP   50 mg at 08/29/17 2130  . hydrOXYzine (ATARAX/VISTARIL) tablet 50 mg  50 mg Oral QHS Armandina StammerNwoko, Agnes I, NP   50 mg at 09/01/17 2213  . LORazepam (ATIVAN) injection 2 mg  2 mg Intramuscular Q6H PRN Laveda AbbeParks, Laurie Britton, NP      . LORazepam (ATIVAN) tablet 2 mg  2 mg Oral Q6H PRN Laveda AbbeParks, Laurie Britton, NP      . magnesium hydroxide (MILK OF MAGNESIA) suspension 30 mL  30 mL Oral Daily PRN Jackelyn PolingBerry, Jason A, NP      . Melatonin TABS 3 mg  3 mg Oral q1800 Mariel CraftMaurer, Sheila M, MD   3 mg  at 09/01/17 1824    Lab Results: No results found for this or any previous visit (from the past 48 hour(s)).  Blood Alcohol level:  Lab Results  Component Value Date   ETH <10 08/27/2017   ETH <10 08/20/2017    Metabolic Disorder Labs: Lab Results  Component Value Date   HGBA1C 4.8 08/29/2017   MPG 91.06 08/29/2017   Lab Results  Component Value Date   PROLACTIN 93.0 (H) 08/29/2017   Lab Results  Component Value Date   CHOL 201 (H) 08/29/2017   TRIG 93 08/29/2017   HDL 47 08/29/2017   CHOLHDL 4.3 08/29/2017   VLDL 19 08/29/2017   LDLCALC 135 (H) 08/29/2017   LDLCALC 117 (H) 10/26/2010    Physical Findings: AIMS: Facial and Oral Movements Muscles of Facial Expression: None, normal Lips and Perioral Area: None, normal Jaw: None, normal Tongue: None, normal,Extremity Movements Upper (arms, wrists, hands, fingers): None, normal Lower (legs, knees, ankles, toes): None, normal, Trunk Movements Neck, shoulders, hips: None, normal, Overall Severity Severity of abnormal movements (highest score from questions above): None, normal Incapacitation due to abnormal movements: None, normal Patient's  awareness of abnormal movements (rate only patient's report): No Awareness, Dental Status Current problems with teeth and/or dentures?: No Does patient usually wear dentures?: No  CIWA:  CIWA-Ar Total: 1 COWS:  COWS Total Score: 1  Musculoskeletal: Strength & Muscle Tone: within normal limits Gait & Station: normal Patient leans: N/A  Psychiatric Specialty Exam: Physical Exam  Nursing note and vitals reviewed.   Review of Systems  Constitutional: Negative for chills and fever.  Respiratory: Negative for cough and shortness of breath.   Cardiovascular: Negative for chest pain.  Gastrointestinal: Negative for abdominal pain, heartburn, nausea and vomiting.  Psychiatric/Behavioral: Negative for depression, hallucinations and suicidal ideas. The patient is not nervous/anxious and does not have insomnia.     Blood pressure 117/81, pulse 78, temperature 98.7 F (37.1 C), temperature source Oral, resp. rate 18, height 5\' 2"  (1.575 m), weight 93.9 kg (207 lb), last menstrual period 08/25/2017.Body mass index is 37.86 kg/m.  General Appearance: Casual and Fairly Groomed  Eye Contact:  Good  Speech:  Clear and Coherent and Normal Rate  Volume:  Normal  Mood:  Euthymic  Affect:  Appropriate and Congruent  Thought Process:  Coherent and Goal Directed  Orientation:  Full (Time, Place, and Person)  Thought Content:  Logical  Suicidal Thoughts:  No  Homicidal Thoughts:  No  Memory:  Immediate;   Fair Recent;   Fair Remote;   Fair  Judgement:  Poor  Insight:  Lacking  Psychomotor Activity:  Normal  Concentration:  Concentration: Fair  Recall:  [employer] of Knowledge:  Fair  Language:  Fair  Akathisia:  No  Handed:    AIMS (if indicated):     Assets:  Resilience  ADL's:  Intact  Cognition:  WNL  Sleep:  Number of Hours: 6.75   Treatment Plan Summary: Daily contact with patient to assess and evaluate symptoms and progress in treatment and Medication management   - Continue  inpatient hospitalization.  - Will continue today8/6/2019plan as below except where it is noted.  Depression. - Continue Prozac 40 mg po daily.  Mood control. - Continue Abilify 5 mg po daily.  Anxiety. - Continue Hydroxyzine 50 mg po Q 6 hours prn.  Agitation/psychosis. - Continue Haldol 5 mg IM Q 6 hours prn. - Lorazepam 2 mg Q 6 hours IM or po prn.  Insomnia. - Continue Melatonin 3 mg po Q hs (1800). - ContinueVistaril 50 mg po Q hs to aid Melatonin.  Other medical issues. - Continue Albuterol inhaler 2 puffs Q 6 hours prn.   - Patient to participate in the group sessions. - Discharge disposition plan ongoing.    Micheal Likens, MD 09/02/2017, 5:23 PM

## 2017-09-02 NOTE — Progress Notes (Signed)
Patient ID: Renee [name], female   DOB: 07/04/1995, 22 y.o.   MRN: [id number] DAR Note: Pt remained flat and withdrawn to her room; remained in bed all evening. Pt denied all; "I am ready to go home." All patient's questions and concerns addressed. Support, encouragement, and safe environment provided. 15-minute safety checks continue. Safety checks continue. Pt continue to be med compliant.

## 2017-09-02 NOTE — Progress Notes (Signed)
D:  Patient's self inventory sheet, patient sleeps good, sleep medication helpful.  Good appetite, normal energy level, good concentration.  Denied depression, hopeless and anxiety.  Denied withdrawals.  Denied physical problems.  Then checked headaches.  Denied physical pain.  Goal is discharge.  Plans to talk to MD about discharge.  No discharge plans. A:  Medications administered per MD orders.  Emotional support and encouragement given patient. R:  Denied SI and HI, contracts for safety.  Denied A/V hallucinations.  Safety maintained with 15 minute checks.

## 2017-09-02 NOTE — Plan of Care (Signed)
  Problem: Safety: Goal: Periods of time without injury will increase Outcome: Progressing   Problem: Education: Goal: Knowledge of [facility] Education information/materials will improve Outcome: Progressing Goal: Emotional status will improve Outcome: Progressing Goal: Mental status will improve Outcome: Progressing Goal: Verbalization of understanding the information provided will improve Outcome: Progressing   Problem: Activity: Goal: Interest or engagement in activities will improve Outcome: Progressing Goal: Sleeping patterns will improve Outcome: Progressing   Problem: Coping: Goal: Ability to verbalize frustrations and anger appropriately will improve Outcome: Progressing Goal: Ability to demonstrate self-control will improve Outcome: Progressing   Problem: Health Behavior/Discharge Planning: Goal: Identification of resources available to assist in meeting health care needs will improve Outcome: Progressing Goal: Compliance with treatment plan for underlying cause of condition will improve Outcome: Progressing   Problem: Physical Regulation: Goal: Ability to maintain clinical measurements within normal limits will improve Outcome: Progressing   Problem: Safety: Goal: Periods of time without injury will increase Outcome: Progressing   Problem: Activity: Goal: Will verbalize the importance of balancing activity with adequate rest periods Outcome: Progressing   Problem: Education: Goal: Will be free of psychotic symptoms Outcome: Progressing Goal: Knowledge of the prescribed therapeutic regimen will improve Outcome: Progressing   Problem: Coping: Goal: Coping ability will improve Outcome: Progressing Goal: Will verbalize feelings Outcome: Progressing   Problem: Health Behavior/Discharge Planning: Goal: Compliance with prescribed medication regimen will improve Outcome: Progressing   Problem: Nutritional: Goal: Ability to achieve adequate  nutritional intake will improve Outcome: Progressing   Problem: Role Relationship: Goal: Ability to communicate needs accurately will improve Outcome: Progressing Goal: Ability to interact with others will improve Outcome: Progressing   Problem: Safety: Goal: Ability to redirect hostility and anger into socially appropriate behaviors will improve Outcome: Progressing Goal: Ability to remain free from injury will improve Outcome: Progressing   Problem: Self-Care: Goal: Ability to participate in self-care as condition permits will improve Outcome: Progressing   Problem: Self-Concept: Goal: Will verbalize positive feelings about self Outcome: Progressing   Problem: Activity: Goal: Will identify at least one activity in which they can participate Outcome: Progressing   Problem: Coping: Goal: Ability to identify and develop effective coping behavior will improve Outcome: Progressing Goal: Ability to interact with others will improve Outcome: Progressing Goal: Demonstration of participation in decision-making regarding own care will improve Outcome: Progressing Goal: Ability to use eye contact when communicating with others will improve Outcome: Progressing   Problem: Health Behavior/Discharge Planning: Goal: Identification of resources available to assist in meeting health care needs will improve Outcome: Progressing   Problem: Self-Concept: Goal: Will verbalize positive feelings about self Outcome: Progressing

## 2017-09-02 NOTE — Progress Notes (Signed)
Did not attend group 

## 2017-09-02 NOTE — [facility] Group Notes (Signed)
LCSW Group Therapy Note   09/02/2017 1:15pm   Type of Therapy and Topic:  Group Therapy:  Positive Affirmations   Participation Level:  Minimal  Description of Group: This group addressed positive affirmation toward self and others. Patients went around the room and identified two positive things about themselves and two positive things about a peer in the room. Patients reflected on how it felt to share something positive with others, to identify positive things about themselves, and to hear positive things from others. Patients were encouraged to have a daily reflection of positive characteristics or circumstances.  Therapeutic Goals 1. Patient will verbalize two of their positive qualities 2. Patient will demonstrate empathy for others by stating two positive qualities about a peer in the group 3. Patient will verbalize their feelings when voicing positive self affirmations and when voicing positive affirmations of others 4. Patients will discuss the potential positive impact on their wellness/recovery of focusing on positive traits of self and others. Summary of Patient Progress:  Stayed the entire time, appeared engaged throughout.  Spoke of being assertive when she was looking for a job.  After filling out multiple applications and not hearing back, she went to an establishment close to home and asked to see the manager for an interview.  Got the job the same day.  Spoke of how her mother is the example of a strong woman who "always tells me to believe in myself."  Therapeutic Modalities Cognitive Behavioral Therapy Motivational Interviewing  Ida RogueRodney B Alishba [place], [place]CS[place] 09/02/2017 1:31 PM

## 2017-09-02 NOTE — Progress Notes (Signed)
Recreation Therapy Notes  Date: 8.6.19 Time: 1000 Location: [address] Dayroom  Group Topic: Communication, Team Building, Problem Solving  Goal Area(s) Addresses:  Patient will effectively work with peer towards shared goal.  Patient will identify skill used to make activity successful.  Patient will identify how skills used during activity can be used to reach post d/c goals.   Behavioral Response: Engaged  Intervention: STEM Activity   Activity: In team's, using 20 plastic straws and 24 inches of masking tape, patients were asked to build an elevated bridge that would hold a light weight paperback book.    Education: Pharmacist, communityocial Skills, Building control surveyorDischarge Planning.   Education Outcome: Acknowledges education/In group clarification offered/Needs additional education.   Clinical Observations/Feedback: Pt was very attentive and active.  Pt worked well with her group in developing a bridge.  Pt expressed she finds it easy to communicate with her support system because "I'm a pretty open person and it's easy for me to talk to people".      Caroll RancherMarjette Haytham Maher, LRT/CTRS    Lillia AbedLindsay, Amaiya Scruton A 09/02/2017 11:16 AM

## 2017-09-02 NOTE — Plan of Care (Signed)
Nurse discussed anxiety, depression, coping skills with patient. 

## 2017-09-03 MED ORDER — MELATONIN 3 MG PO TABS: 3.0000 mg | ORAL_TABLET | Freq: Every day | ORAL | 0 refills | Status: DC

## 2017-09-03 MED ORDER — FLUOXETINE HCL 40 MG PO CAPS: 40.0000 mg | ORAL_CAPSULE | Freq: Every day | ORAL | 0 refills | Status: DC

## 2017-09-03 MED ORDER — ALBUTEROL SULFATE HFA 108 (90 BASE) MCG/ACT IN AERS: 2.0000 | INHALATION_SPRAY | Freq: Four times a day (QID) | RESPIRATORY_TRACT | Status: DC

## 2017-09-03 MED ORDER — ARIPIPRAZOLE 5 MG PO TABS: 5.0000 mg | ORAL_TABLET | Freq: Every day | ORAL | 0 refills | Status: DC

## 2017-09-03 MED ORDER — HYDROXYZINE HCL 50 MG PO TABS: ORAL_TABLET | ORAL | 0 refills | Status: DC

## 2017-09-03 NOTE — Tx Team (Signed)
Interdisciplinary Treatment and Diagnostic Plan Update  09/03/2017 Time of Session: 0830AM Renee [name] MRN: [id number]  Principal Diagnosis: MDD, recurrent, severe with psychotic features  Secondary Diagnoses: Principal Problem:   Severe recurrent major depression with psychotic features [facility]) Active Problems:   Substance-induced psychotic disorder ([place])   Marijuana abuse   Current Medications:  Current Facility-Administered Medications  Medication Dose Route Frequency Provider Last Rate Last Dose  . acetaminophen (TYLENOL) tablet 650 mg  650 mg Oral Q6H PRN Lindon Romp A, NP   650 mg at 08/29/17 0933  . albuterol (PROVENTIL HFA;VENTOLIN HFA) 108 (90 Base) MCG/ACT inhaler 2 puff  2 puff Inhalation Q6H Ethelene Hal, NP   2 puff at 09/01/17 1500  . alum & mag hydroxide-simeth (MAALOX/MYLANTA) 200-200-20 MG/5ML suspension 30 mL  30 mL Oral Q4H PRN Lindon Romp A, NP      . ARIPiprazole (ABILIFY) tablet 5 mg  5 mg Oral Daily Lavella Hammock, MD   5 mg at 09/03/17 0750  . FLUoxetine (PROZAC) capsule 40 mg  40 mg Oral Daily Lavella Hammock, MD   40 mg at 09/03/17 0750  . haloperidol lactate (HALDOL) injection 5 mg  5 mg Intramuscular Q6H PRN Ethelene Hal, NP      . hydrOXYzine (ATARAX/VISTARIL) tablet 50 mg  50 mg Oral Q6H PRN Ethelene Hal, NP   50 mg at 08/29/17 2130  . hydrOXYzine (ATARAX/VISTARIL) tablet 50 mg  50 mg Oral QHS Lindell Spar I, NP   50 mg at 09/02/17 2100  . LORazepam (ATIVAN) injection 2 mg  2 mg Intramuscular Q6H PRN Ethelene Hal, NP      . LORazepam (ATIVAN) tablet 2 mg  2 mg Oral Q6H PRN Ethelene Hal, NP      . magnesium hydroxide (MILK OF MAGNESIA) suspension 30 mL  30 mL Oral Daily PRN Rozetta Nunnery, NP      . Melatonin TABS 3 mg  3 mg Oral q1800 Lavella Hammock, MD   3 mg at 09/02/17 1807   PTA Medications: Medications Prior to Admission  Medication Sig Dispense Refill Last Dose  . albuterol (PROAIR HFA) 108  (90 Base) MCG/ACT inhaler INHALE 2 PUFFS INTO THE LUNGS EVERY 4 (FOUR) HOURS AS NEEDED FOR WHEEZING OR SHORTNESS OF BREATH. 8.5 Inhaler 3 unknown  . citalopram (CELEXA) 20 MG tablet Take 1 tablet (20 mg total) by mouth daily. 30 tablet 3 Past Week at Unknown time    Patient Stressors: Marital or family conflict Medication change or noncompliance  Patient Strengths: Curator fund of knowledge Supportive family/friends  Treatment Modalities: Medication Management, Group therapy, Case management,  1 to 1 session with clinician, Psychoeducation, Recreational therapy.   Physician Treatment Plan for Primary Diagnosis: MDD, recurrent, severe with psychotic features  Medication Management: Evaluate patient's response, side effects, and tolerance of medication regimen.  Therapeutic Interventions: 1 to 1 sessions, Unit Group sessions and Medication administration.  Evaluation of Outcomes: Adequate for Discharge  Physician Treatment Plan for Secondary Diagnosis: Principal Problem:   Severe recurrent major depression with psychotic features ([place]) Active Problems:   Substance-induced psychotic disorder ([place])   Marijuana abuse  Medication Management: Evaluate patient's response, side effects, and tolerance of medication regimen.  Therapeutic Interventions: 1 to 1 sessions, Unit Group sessions and Medication administration.  Evaluation of Outcomes: Adequate for Discharge   RN Treatment Plan for Primary Diagnosis: MDD, recurrent, severe with psychotic features  Long Term Goal(s): Knowledge of disease  and therapeutic regimen to maintain health will improve  Short Term Goals: Ability to remain free from injury will improve, Ability to verbalize frustration and anger appropriately will improve, Ability to demonstrate self-control and Ability to disclose and discuss suicidal ideas  Medication Management: RN will administer medications as ordered by provider, will assess and  evaluate patient's response and provide education to patient for prescribed medication. RN will report any adverse and/or side effects to prescribing provider.  Therapeutic Interventions: 1 on 1 counseling sessions, Psychoeducation, Medication administration, Evaluate responses to treatment, Monitor vital signs and CBGs as ordered, Perform/monitor CIWA, COWS, AIMS and Fall Risk screenings as ordered, Perform wound care treatments as ordered.  Evaluation of Outcomes: Adequate for Discharge   LCSW Treatment Plan for Primary Diagnosis: MDD, recurrent, severe with psychotic features Long Term Goal(s): Safe transition to appropriate next level of care at discharge, Engage patient in therapeutic group addressing interpersonal concerns.  Short Term Goals: Engage patient in aftercare planning with referrals and resources, Increase social support, Increase ability to appropriately verbalize feelings and Increase emotional regulation  Therapeutic Interventions: Assess for all discharge needs, 1 to 1 time with Social worker, Explore available resources and support systems, Assess for adequacy in community support network, Educate family and significant other(s) on suicide prevention, Complete Psychosocial Assessment, Interpersonal group therapy.  Evaluation of Outcomes: Met Return home, follow up [facility]   Progress in Treatment: Attending groups: No. New to unit. Continuing to assess.  Participating in groups: No. Taking medication as prescribed: Yes. Toleration medication: Yes. Family/Significant other contact made: No, will contact:  pt's mother who initiated IVC for collateral information and to complete SPE with pt's consent. Patient understands diagnosis: No. poor insight.  Discussing patient identified problems/goals with staff: No. Medical problems stabilized or resolved: Yes. Denies suicidal/homicidal ideation: Yes. Issues/concerns per patient self-inventory: No. Other: n/a   New  problem(s) identified: No, Describe:  n/a  New Short Term/Long Term Goal(s): decrease in AVH/symtpoms of psychosis, medication management for mood stabilization; elimination of SI thoughts; development of comprehensive mental wellness/sobriety plan.   Patient Goals:  Pt declined to specify goal "I don't need help. I'm okay."  Discharge Plan or Barriers: CSW assessing for appropriate referrals. [place] pamphlet, Mobile Crisis information provided to patient for additional community support and resources.   Reason for Continuation of Hospitalization:   Estimated Length of Stay: Today  Attendees: Patient:  09/03/2017 8:23 AM  Physician: Maris Berger MD 09/03/2017 8:23 AM  Nursing: Royston Bake RN 09/03/2017 8:23 AM  RN Care Manager:x 09/03/2017 8:23 AM  Social Worker: Roque Lias LCSW 09/03/2017 8:23 AM  Recreational Therapist: x 09/03/2017 8:23 AM  Other: Lindell Spar NP; Saul Fordyce NP 09/03/2017 8:23 AM  Other:  09/03/2017 8:23 AM  Other: 09/03/2017 8:23 AM    Scribe for Treatment Team: Trish Mage, LCSW 09/03/2017 8:23 AM

## 2017-09-03 NOTE — Progress Notes (Signed)
  [facility] Discharge [facility] :  Will you be returning to the same living situation after discharge:  Yes,  home At discharge, do you have transportation home?: Yes,  mother Do you have the ability to pay for your medications: Yes,  mental health  Release of information consent forms completed and in the chart;  Patient's signature needed at discharge.  Patient to Follow up at: Follow-up Information    [facility] Follow up.   Specialty:  Professional Counselor Why:  Go to the walk-in clinic within 3 days of discharge from the hospital, M-F between 8:30 and 12 and 1 and 3, for your hospital follow up appointment Contact information: [facility] [address] [place] [address] [phone]           Next level of care provider has access to [facility] Link:no  Safety Planning and Suicide Prevention discussed: Yes,  yes  Have you used any form of tobacco in the last 30 days? (Cigarettes, Smokeless Tobacco, Cigars, and/or Pipes): No  Has patient been referred to the Quitline?: N/A patient is not a smoker  Patient has been referred for addiction treatment: N/A  Ida RogueRodney B Chitara Clonch, LCSW 09/03/2017, 9:59 AM

## 2017-09-03 NOTE — Progress Notes (Signed)
Recreation Therapy Notes  Date: 8.7.19 Time: 1000 Location: 500 Hall Dayroom  Group Topic: Coping Skills  Goal Area(s) Addresses:  Patient will be able to identify positive coping skills. Patient will be able to identify benefits of coping skills. Patient will identify benefits of using coping skills post d/c.  Behavioral Response: Engaged  Intervention: Worksheet, magazines, scissors, glue sticks, Holiday representativeconstruction paper  Activity: PharmacologistCoping Skills.  Patients were to use the magazines to find pictures that represent coping skills for diversions, social, cognitive, tension releasers and physical.   Education: Coping Skills, Discharge Planning.   Education Outcome: Acknowledges understanding/In group clarification offered/Needs additional education.   Clinical Observations/Feedback:  Pt was quiet but engaged.  Pt was seen smiling during activity.  Pt identified her coping skills as diversions- football games, playing basketball with her brother; social- shopping, doing make up; cognitive- cooking, online shopping; tension releaser- going for a drive, female Viagra; and physical- eating healthy food, working out.    Renee RancherMarjette Perpetua [name], Renee [name]         Lillia AbedLindsay, [name] 09/03/2017 11:19 AM

## 2017-09-03 NOTE — [facility] Suicide Risk Assessment (Signed)
[facility] Discharge Suicide Risk Assessment   Principal Problem: Severe recurrent major depression with psychotic features [facility]) Discharge Diagnoses:  Patient Active Problem List   Diagnosis Date Noted  . 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 Marijuana abuse [F12.10] 08/29/2017  . Severe recurrent major depression with psychotic features (HCC) [F33.3] 08/28/2017  . Involuntary commitment [Z04.6]   . Schizoaffective disorder (HCC) [F25.9]   . Depression [F32.9] 08/21/2017  . Severe episode of recurrent major depressive disorder, without psychotic features (HCC) [F33.2] 08/20/2017  . Asthma with exacerbation [J45.901] 04/26/2014  . Heart palpitations [R00.2] 12/15/2013  . Blurred vision, bilateral [H53.8] 12/15/2013  . Decreased visual acuity [H54.7] 12/15/2013  . Generalized anxiety disorder [F41.1] 12/15/2013  . Tension headache [G44.209] 12/15/2013  . Encounter for sexually transmitted disease counseling [Z70.8] 12/29/2012  . General counseling and advice on female contraception [Z30.09] 12/29/2012  . Paresthesias [R20.2] 08/05/2012  . Tongue lesion [K14.8] 08/05/2012  . ASTHMA, PERSISTENT [J45.909] 06/22/2008  . CHILDHOOD OBESITY [E66.9] 08/27/2007  . 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 ALLERGIC RHINITIS [J30.9] 06/20/2006    Total Time spent with patient: 30 minutes  Musculoskeletal: Strength & Muscle Tone: within normal limits Gait & Station: normal Patient leans: N/A  Psychiatric Specialty Exam: Review of Systems  Constitutional: Negative for chills and fever.  Respiratory: Negative for cough and shortness of breath.   Cardiovascular: Negative for chest pain.  Gastrointestinal: Negative for abdominal pain, heartburn, nausea and vomiting.  Psychiatric/Behavioral: Negative for depression, hallucinations and suicidal ideas. The patient is not nervous/anxious and does not have insomnia.     Blood pressure 117/81, pulse 78, temperature  98.7 F (37.1 C), temperature source Oral, resp. rate 18, height 5\' 2"  (1.575 m), weight 93.9 kg (207 lb), last menstrual period 08/25/2017.Body mass index is 37.86 kg/m.  General Appearance: Casual and Fairly Groomed  Patent attorney::  Good  Speech:  Clear and Coherent and Normal Rate  Volume:  Normal  Mood:  Euthymic  Affect:  Appropriate, Congruent and Constricted  Thought Process:  Coherent and Goal Directed  Orientation:  Full (Time, Place, and Person)  Thought Content:  Logical  Suicidal Thoughts:  No  Homicidal Thoughts:  No  Memory:  Immediate;   Fair Recent;   Fair Remote;   Fair  Judgement:  Fair  Insight:  Lacking  Psychomotor Activity:  Normal  Concentration:  Fair  Recall:  [employer] of Knowledge:Fair  Language: Fair  Akathisia:  No  Handed:    AIMS (if indicated):     Assets:  Resilience  Sleep:  Number of Hours: 6  Cognition: WNL  ADL's:  Intact   Mental Status Per Nursing Assessment::   On Admission:  NA  Demographic Factors:  Adolescent or young adult and Low socioeconomic status  Loss Factors: Financial problems/change in socioeconomic status  Historical Factors: Impulsivity  Risk Reduction Factors:   Sense of responsibility to family, Living with another person, especially a relative, Positive social support, Positive therapeutic relationship and Positive coping skills or problem solving skills  Continued Clinical Symptoms:  Severe Anxiety and/or Agitation Depression:   Severe  Cognitive Features That Contribute To Risk:  None    Suicide Risk:  Minimal: No identifiable suicidal ideation.  Patients presenting with no risk factors but with morbid ruminations; may be classified as minimal risk based on the severity of the depressive symptoms  Follow-up Information    [facility] Follow up.   Specialty:  Pharmacist, hospital  Why:  Go to the walk-in clinic within 3 days of discharge from the hospital, M-F between 8:30  and 12 and 1 and 3, for your hospital follow up appointment Contact information: [facility] [address] [place] [address] [phone]         Subjective Data:  Renee Bimlerymarri T Robertsonis a 22 y.o.femalewho [facility] with complaint of depressed mood, passive suicidal ideation, and other depressive symptoms.    As per chart review: Pt was accompanied by her brother and mother. [place] mother. Per report, she attends college and also works. She has never received any outpatient or inpatient psychiatric/therapy care. Pt has not been assessed by TTS before. History was provided by Pt and Pt's mother. Pt reported that she has experienced the following symptoms over the last two months: Passive suicidal ideation (no Despondency; poor sleep (5 hours per night); lowered appetite (lost 15 lbs over two months); feelings of worthlessness and hopelessness; fatigue. Pt also endorsed recent stressor -- she recently lost a job and may lose another because she is lethargic and avolitional. Pt denied past suicide attempt, homicidal ideation, hallucination, self-injurious behavior, and substance use. Pt appeared apathetic and lethargic. Her responses were ambiguous. Chartered loss adjusterAuthor spoke with mother separately. Mother reported that she is increasingly concerned about Pt because Pt cut her wrist last week (superficial cut, no sutures/stitches required), because Pt is endorsing passive suicidal ideation, and because Pt appears to be responding to internal stimuli. Mother requested inpatient treatment or an overnight observation. Pt denied trauma, reported that she felt safe at home, and denied current stressors. ''I don't know... I don't feel good or like doing anything."  As per evaluation today: Pt shares, "I'm good." She reports that overall she is doing well today, and she has no specific concerns. She denies  SI/HI/AH/VH. She is sleeping well. Her appetite is good. She is tolerating her medications well. She is in agreement to continue her current regimen without changes, and she feels that her medications have been helpful for her in reducing anxiety and depression. Pt is planning on returning home to stay with her mother. [facility] She was able to engage in safety planning including [facility] or contact emergency services if she feels unable to maintain her own safety or the safety of others. Pt had no further questions, comments, or concerns.    Plan Of Care/Follow-up recommendations:   - Discharge to outpatient level of care  MDD, recurrent, severe, with psychotic features - Continue Prozac 40 mg po daily. - Continue Abilify 5 mg po daily.  Anxiety. - Continue Hydroxyzine 50 mg po Q 6 hours prn anxiety  Insomnia. - Continue Melatonin 3 mg po Q hs (1800). - ContinueVistaril 50 mg po Q hs to aid Melatonin.  Other medical issues. - Continue Albuterol inhaler 2 puffs Q 6 hours prn SOB/wheeze.   Activity:  as tolerated Diet:  normal Tests:  NA Other:  see above for DC plan  Micheal Likenshristopher T Tiernan Suto, MD 09/03/2017, 9:31 AM

## 2017-09-03 NOTE — Progress Notes (Signed)
Discharge Note:  Patient discharged home with family member.  Patient denied SI and HI.  Denied A/V hallucinations.  Suicide prevention information given and discussed with patient who stated she understood and had no questions.  Patient stated she received all her belongings, clothing, prescriptions, medications, etrc.  Patient stated she [facility].  All required discharge given to patient at discharge. t

## 2017-09-03 NOTE — Progress Notes (Signed)
Recreation Therapy Notes  INPATIENT RECREATION TR PLAN  Patient Details Name: Renee [name] MRN: [id number] DOB: [birth year] Today's Date: 09/03/2017  Rec Therapy Plan Is patient appropriate for Therapeutic Recreation?: Yes Treatment times per week: about 3 days Estimated Length of Stay: 5-7 days TR Treatment/Interventions: Group participation (Comment)  Discharge Criteria Pt will be discharged from therapy if:: Discharged Treatment plan/goals/alternatives discussed and agreed upon by:: Patient/family  Discharge Summary Short term goals set: See patient care plan Short term goals met: Complete Progress toward goals comments: Groups attended Which groups?: Coping skills, Other (Comment)(Teambuilding) Reason goals not met: None Therapeutic equipment acquired: N/A Reason patient discharged from therapy: Discharge from hospital Pt/family agrees with progress & goals achieved: Yes Date patient discharged from therapy: 09/03/17   Victorino Sparrow, LRT/CTRS  Ria Comment, [place] A 09/03/2017, 11:47 AM

## 2017-09-03 NOTE — Plan of Care (Signed)
Pt was able to identify positive coping mechanisms at completion of recreation therapy sessions.   Caroll RancherMarjette Kenton Fortin, LRT/CTRS

## 2017-09-03 NOTE — Discharge Summary (Addendum)
Physician Discharge Summary Note  Patient:  Renee [name] is an 22 y.o., female  MRN:  [id number]  DOB:  [birth year]  Patient phone:  [phone] (home)   Patient address:   [address] [place] [address],   Total Time spent with patient: Greater than 30 minutes  Date of Admission:  08/28/2017  Date of Discharge: 09-03-17  Reason for Admission: Worsening depression triggering suicidal ideations.  Principal Problem: Severe recurrent major depression with psychotic features [facility])  Discharge Diagnoses: Patient Active Problem List   Diagnosis Date Noted  . 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 Marijuana abuse [F12.10] 08/29/2017  . Severe recurrent major depression with psychotic features (HCC) [F33.3] 08/28/2017  . Involuntary commitment [Z04.6]   . Schizoaffective disorder (HCC) [F25.9]   . Depression [F32.9] 08/21/2017  . Severe episode of recurrent major depressive disorder, without psychotic features (HCC) [F33.2] 08/20/2017  . Asthma with exacerbation [J45.901] 04/26/2014  . Heart palpitations [R00.2] 12/15/2013  . Blurred vision, bilateral [H53.8] 12/15/2013  . Decreased visual acuity [H54.7] 12/15/2013  . Generalized anxiety disorder [F41.1] 12/15/2013  . Tension headache [G44.209] 12/15/2013  . Encounter for sexually transmitted disease counseling [Z70.8] 12/29/2012  . General counseling and advice on female contraception [Z30.09] 12/29/2012  . Paresthesias [R20.2] 08/05/2012  . Tongue lesion [K14.8] 08/05/2012  . ASTHMA, PERSISTENT [J45.909] 06/22/2008  . CHILDHOOD OBESITY [E66.9] 08/27/2007  . 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 ALLERGIC RHINITIS [J30.9] 06/20/2006   Past Psychiatric History: Substance induced Psychosis.  Past Medical History:  Past Medical History:  Diagnosis Date  . Anxiety   . Asthma    History reviewed. No pertinent surgical history.  Family History: History reviewed. No  pertinent family history.  Family Psychiatric  History: See H&P.  Social History:  Social History   Substance and Sexual Activity  Alcohol Use No     Social History   Substance and Sexual Activity  Drug Use No    Social History   Socioeconomic History  . Marital status: Single    Spouse name: Not on file  . Number of children: Not on file  . Years of education: Not on file  . Highest education level: Not on file  Occupational History  . Not on file  Social Needs  . Financial resource strain: Not on file  . Food insecurity:    Worry: Not on file    Inability: Not on file  . Transportation needs:    Medical: Not on file    Non-medical: Not on file  Tobacco Use  . Smoking status: Never Smoker  . Smokeless tobacco: Never Used  Substance and Sexual Activity  . Alcohol use: No  . Drug use: No  . Sexual activity: Never  Lifestyle  . Physical activity:    Days per week: Not on file    Minutes per session: Not on file  . Stress: Not on file  Relationships  . Social connections:    Talks on phone: Not on file    Gets together: Not on file    Attends religious service: Not on file    Active member of club or organization: Not on file    Attends meetings of clubs or organizations: Not on file    Relationship status: Not on file  Other Topics Concern  . Not on file  Social History Narrative  . Not on file   Hospital Course: (Per admission evaluation): Renee T Robertsonis a 22 y.o.femalewho presented to  [facility] on voluntary basis with complaint of depressed mood, passive suicidal ideation, and other depressive symptoms. Pt was accompanied by her brother and mother. [place] lives in [place] with her mother. Per report, she attends college and also works. She has never received any outpatient or inpatient psychiatric/therapy care. Pt has not been assessed by TTS before. History was provided by Pt and Pt's mother. Pt reported that she has experienced the following  symptoms over the last two months: Passive suicidal ideation (no Despondency; poor sleep (5 hours per night); lowered appetite (lost 15 lbs over two months); feelings of worthlessness and hopelessness; fatigue. Pt also endorsed recent stressor -- she recently lost a job and may lose another because she is lethargic and avolitional. Pt denied past suicide attempt, homicidal ideation, hallucination, self-injurious behavior, and substance use. Pt appeared apathetic and lethargic. Her responses were ambiguous. Chartered loss adjusterAuthor spoke with mother separately. Mother reported that she is increasingly concerned about Pt because Pt cut her wrist last week (superficial cut, no sutures/stitches required), because Pt is endorsing passive suicidal ideation, and because Pt appears to be responding to internal stimuli. Mother requested inpatient treatment or an overnight observation. Pt denied trauma, reported that she felt safe at home, and denied current stressors. ''I don't know... I don't feel good or like doing anything."  After the above admission evaluation, Aamina's presenting symptoms were identified. The medication regimen targeting those presenting symptoms were discussed & with her consent, initiated. She received & was discharged on; Abilify 5 mg for mood control, Fluoxetine 40 mg for depression, Vistaril 50 mg prn for anxiety & Melatonin 3 mg for insomnia. She was enrolled & participated in the group counseling sessions being offered & held on this unit. She learned coping skills. She presented other significant medical issues that required treatment or monitoring. Renee [name] was resumed & discharged on all her pertinent home medications for those health issues. She tolerated her treatment regimen without any adverse effects or reactions reported.  Today upon her discharge evaluation with the attending psychiatrist, Renee [name] shares, "I'm good."She reports that overall she is doing well today, and she has no  specific concerns.She denies SI/HI/AH/VH. She is sleeping well. Her appetite is good. She is tolerating her medications well. She is in agreement to continue her current regimen without changes, and she feels that her medications have been helpful for her in reducing anxiety and depression.Pt is planning on returning home to stay with her mother. [facility] She was able to engage in safety planning including plan to return [facility] or contact emergency services if she feels unable to maintain her own safety or the safety of others. Pt had no further questions, comments, or concerns.  She is discharged to an outpatient level of care as noted below.  Physical Findings: AIMS: Facial and Oral Movements Muscles of Facial Expression: None, normal Lips and Perioral Area: None, normal Jaw: None, normal Tongue: None, normal,Extremity Movements Upper (arms, wrists, hands, fingers): None, normal Lower (legs, knees, ankles, toes): None, normal, Trunk Movements Neck, shoulders, hips: None, normal, Overall Severity Severity of abnormal movements (highest score from questions above): None, normal Incapacitation due to abnormal movements: None, normal Patient's awareness of abnormal movements (rate only patient's report): No Awareness, Dental Status Current problems with teeth and/or dentures?: No Does patient usually wear dentures?: No  CIWA:  CIWA-Ar Total: 1 COWS:  COWS Total Score: 1  Musculoskeletal: Strength & Muscle Tone: within normal limits Gait &  Station: normal Patient leans: N/A  Psychiatric Specialty Exam: Physical Exam  Constitutional: She appears well-developed.  HENT:  Head: Normocephalic.  Eyes: Pupils are equal, round, and reactive to light.  Neck: Normal range of motion.  Cardiovascular: Normal rate.    Review of Systems  Constitutional: Negative.   HENT: Negative.   Eyes: Negative.   Respiratory: Negative.   Cardiovascular:  Negative.   Gastrointestinal: Negative.   Genitourinary: Negative.   Musculoskeletal: Negative.   Skin: Negative.   Neurological: Negative.   Endo/Heme/Allergies: Negative.   Psychiatric/Behavioral: Positive for depression (Stable), hallucinations (Hx. psychosis) and substance abuse (Hx. THC use). Negative for memory loss and suicidal ideas. The patient has insomnia (Stable). The patient is not nervous/anxious.     Blood pressure 117/81, pulse 78, temperature 98.7 F (37.1 C), temperature source Oral, resp. rate 18, height 5\' 2"  (1.575 m), weight 93.9 kg (207 lb), last menstrual period 08/25/2017.Body mass index is 37.86 kg/m.  See Md's SRA   Have you used any form of tobacco in the last 30 days? (Cigarettes, Smokeless Tobacco, Cigars, and/or Pipes): No  Has this patient used any form of tobacco in the last 30 days? (Cigarettes, Smokeless Tobacco, Cigars, and/or Pipes): N/A  Blood Alcohol level:  Lab Results  Component Value Date   ETH <10 08/27/2017   ETH <10 08/20/2017   Metabolic Disorder Labs:  Lab Results  Component Value Date   HGBA1C 4.8 08/29/2017   MPG 91.06 08/29/2017   Lab Results  Component Value Date   PROLACTIN 93.0 (H) 08/29/2017   Lab Results  Component Value Date   CHOL 201 (H) 08/29/2017   TRIG 93 08/29/2017   HDL 47 08/29/2017   CHOLHDL 4.3 08/29/2017   VLDL 19 08/29/2017   LDLCALC 135 (H) 08/29/2017   LDLCALC 117 (H) 10/26/2010   See Psychiatric Specialty Exam and Suicide Risk Assessment completed by Attending Physician prior to discharge.  Discharge destination:  Home  Is patient on multiple antipsychotic therapies at discharge:  No   Has Patient had three or more failed trials of antipsychotic monotherapy by history:  No  Recommended Plan for Multiple Antipsychotic Therapies: NA  Allergies as of 09/03/2017   No Known Allergies     Medication List    STOP taking these medications   citalopram 20 MG tablet Commonly known as:  CELEXA      TAKE these medications     Indication  albuterol 108 (90 Base) MCG/ACT inhaler Commonly known as:  PROAIR HFA Inhale 2 puffs into the lungs every 6 (six) hours. For shortness of breath What changed:    how much to take  how to take this  when to take this  additional instructions  Indication:  Asthma   ARIPiprazole 5 MG tablet Commonly known as:  ABILIFY Take 1 tablet (5 mg total) by mouth daily. For mood control Start taking on:  09/04/2017  Indication:  Mood control   FLUoxetine 40 MG capsule Commonly known as:  PROZAC Take 1 capsule (40 mg total) by mouth daily. For depression Start taking on:  09/04/2017  Indication:  Major Depressive Disorder   hydrOXYzine 50 MG tablet Commonly known as:  ATARAX/VISTARIL Take 1 tablet (50 mg) by mouth four times daily as needed: For anxiety/sleep  Indication:  Feeling Anxious   Melatonin 3 MG Tabs Take 1 tablet (3 mg total) by mouth daily at 6 PM. For sleep  Indication:  Trouble Sleeping      Follow-up Information  [facility] Follow up.   Specialty:  Professional Counselor Why:  Go to the walk-in clinic within 3 days of discharge from the hospital, M-F between 8:30 and 12 and 1 and 3, for your hospital follow up appointment Contact information: [facility] [address] [place] [address] [phone]          Follow-up recommendations: Activity:  As tolerated Diet: As recommended by your primary care doctor. Keep all scheduled follow-up appointments as recommended.   Comments: Patient is instructed prior to discharge to: Take all medications as prescribed by his/her mental healthcare provider. Report any adverse effects and or reactions from the medicines to his/her outpatient provider promptly. Patient has been instructed & cautioned: To not engage in alcohol and or illegal drug use while on prescription medicines. In the event of worsening symptoms, patient  is instructed to call the crisis hotline, 911 and or go to the nearest ED for appropriate evaluation and treatment of symptoms. To follow-up with his/her primary care provider for your other medical issues, concerns and or health care needs.   Signed: Armandina Stammer, NP, PMHNP, FNP-BC 09/03/2017, 9:46 AM   Patient seen, Suicide Assessment Completed.  Disposition Plan Reviewed

## 2017-09-03 NOTE — Progress Notes (Signed)
D:  Patient's self inventory sheet, patient sleeps good, no sleep medication given.  Good appetite, normal energy level, good concentration.  Denied depression, hopeless and anxiety.  Denied withdrawals.  Denied SI.  Denied physical problems.  Denied physical pain.  Goal is discharge.  Waiting to talk to MD.  No discharge plans. A:  Medications administered per MD orders.  Emotional support and encouragement given patient. R:  Denied SI and HI, contracts for safety.  Denied A/V hallucinations.  Safety maintained with 15 minute checks.

## 2018-02-03 ENCOUNTER — Ambulatory Visit: Payer: Self-pay | Admitting: Family Medicine

## 2018-02-04 ENCOUNTER — Ambulatory Visit (INDEPENDENT_AMBULATORY_CARE_PROVIDER_SITE_OTHER): Payer: BLUE CROSS/BLUE SHIELD | Admitting: Family Medicine

## 2018-02-04 VITALS — BP 125/80 | HR 93 | Temp 97.5°F | Wt 232.2 lb

## 2018-02-04 DIAGNOSIS — J4520 Mild intermittent asthma, uncomplicated: Secondary | ICD-10-CM

## 2018-02-04 DIAGNOSIS — K529 Noninfective gastroenteritis and colitis, unspecified: Secondary | ICD-10-CM

## 2018-02-04 MED ORDER — ALBUTEROL SULFATE HFA 108 (90 BASE) MCG/ACT IN AERS: 2.0000 | INHALATION_SPRAY | Freq: Four times a day (QID) | RESPIRATORY_TRACT | 2 refills | Status: DC

## 2018-02-04 MED ORDER — ALBUTEROL SULFATE HFA 108 (90 BASE) MCG/ACT IN AERS: 2.0000 | INHALATION_SPRAY | Freq: Four times a day (QID) | RESPIRATORY_TRACT | Status: DC

## 2018-02-04 MED ORDER — ONDANSETRON HCL 4 MG PO TABS
4.0000 mg | ORAL_TABLET | Freq: Three times a day (TID) | ORAL | 0 refills | Status: DC | PRN
Start: 2018-02-04 — End: 2018-06-09

## 2018-02-04 NOTE — Progress Notes (Signed)
Subjective:    Patient ID: Renee [name], female    DOB: [birth year], 23 y.o.   MRN: [id number]   CC: vomiting, diarrhea, near syncope  HPI:  Vomiting, diarrhea, presyncopal episode Patient reports that on 2 occasions last week she had an episode of diarrhea and vomiting.  Reports that when she was feeling as if she was about to have an episode of diarrhea as she started to feel dizzy and lightheaded.  Denies ever passing out.  She reports that she would then have to run to the restroom and would feel fine afterwards.  Patient reports that she also had chills at this time.  Patient denies any sick contacts.  Denies any fevers.  Patient denies any blood in her stool or vomit.  Denies any dark stools.  Patient reports that she has been feeling better over the past few days.  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  Patient reports that she has been able to eat and drink normally for the past few days.  Denies any chest pain, shortness of breath, palpitations, leg swelling.  Asthma Patient reports that she has intermittently needed her asthma inhaler over the past few months.  Has not endorsed any increased cough.  No shortness of breath.  Patient would just like refill in order to have it in case she needs it.  Smoking status reviewed  ROS: 10 point ROS is otherwise negative, except as mentioned in HPI  Patient Active Problem List   Diagnosis Date Noted  . Gastroenteritis 02/09/2018  . 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 Marijuana abuse 08/29/2017  . 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 Involuntary commitment   . Schizoaffective disorder (HCC)   . Depression 08/21/2017  . Severe episode of recurrent major depressive disorder, without psychotic features (HCC) 08/20/2017  . Asthma with exacerbation 04/26/2014  . Heart palpitations 12/15/2013  . Blurred vision, bilateral 12/15/2013  . Decreased visual  acuity 12/15/2013  . Generalized anxiety disorder 12/15/2013  . Tension headache 12/15/2013  . Encounter for sexually transmitted disease counseling 12/29/2012  . General counseling and advice on female contraception 12/29/2012  . Paresthesias 08/05/2012  . Tongue lesion 08/05/2012  . Asthma 06/22/2008  . CHILDHOOD OBESITY 08/27/2007  . H/O Kawasaki's disease 05/28/2007  . ALLERGIC RHINITIS 06/20/2006     Objective:  BP 125/80   Pulse 93   Temp (!) 97.5 F (36.4 C) (Oral)   Wt 232 lb 3.2 oz (105.3 kg)   SpO2 97%   BMI 42.47 kg/m  Vitals and nursing note reviewed  General: NAD, pleasant, well-appearing Cardiac: RRR, normal heart sounds, no murmurs Respiratory: CTAB, normal effort Abdomen: soft, nontender, nondistended Extremities: no edema or cyanosis. WWP. Skin: warm and dry, no rashes noted Neuro: alert and oriented, no focal deficits Psych: normal affect, normal thought content  Assessment & Plan:    Gastroenteritis Patient with multiple episodes of nonbloody, nonbilious vomiting and nonbloody diarrhea.  Patient appears well-hydrated on exam.  Reports that her symptoms have resolved over the past few days.  Likely viral in etiology given that patient has only had a few days of symptoms and is improving.  If patient continues to have diarrhea and vomiting may need further work-up to evaluate for infectious versus inflammatory cause -Will give Zofran if patient develops any worsening nausea -Given handout for appropriate diet given that patient has had diarrhea. Patient encouraged to maintain hydration with drinks such  as half apple juice and half water or Gatorade in order to also obtain electrolytes. -Patient given strict return precautions  Asthma Refilled patient's albuterol inhaler today per her request.  Patient's symptoms are stable and patient is not having an acute exacerbation at this time.    [place] Evonte Prestage, DO Family Medicine Resident PGY-2

## 2018-02-04 NOTE — Patient Instructions (Signed)
Thank you for coming to see me today. It was a pleasure! Today we talked about:   You may use zofran as needed for nausea and vomiting. Please be sure to wash your hands. I have refilled your inhaler.   Please follow-up as needed.  If you have any questions or concerns, please do not hesitate to call the office at [phone].  Take Care,   [place] Tasharra Nodine, DO  Food Choices to Help Relieve Diarrhea, Adult When you have diarrhea, the foods you eat and your eating habits are very important. Choosing the right foods and drinks can help:  Relieve diarrhea.  Replace lost fluids and nutrients.  Prevent dehydration. What general guidelines should I follow?  Relieving diarrhea  Choose foods with less than 2 g or .07 oz. of fiber per serving.  Limit fats to less than 8 tsp (38 g or 1.34 oz.) a day.  Avoid the following: ? Foods and beverages sweetened with high-fructose corn syrup, honey, or sugar alcohols such as xylitol, sorbitol, and mannitol. ? Foods that contain a lot of fat or sugar. ? Fried, greasy, or spicy foods. ? High-fiber grains, breads, and cereals. ? Raw fruits and vegetables.  Eat foods that are rich in probiotics. These foods include dairy products such as yogurt and fermented milk products. They help increase healthy bacteria in the stomach and intestines (gastrointestinal tract, or GI tract).  If you have lactose intolerance, avoid dairy products. These may make your diarrhea worse.  Take medicine to help stop diarrhea (antidiarrheal medicine) only as told by your health care provider. Replacing nutrients  Eat small meals or snacks every 3-4 hours.  Eat bland foods, such as white rice, toast, or baked potato, until your diarrhea starts to get better. Gradually reintroduce nutrient-rich foods as tolerated or as told by your health care provider. This includes: ? Well-cooked protein foods. ? Peeled, seeded, and soft-cooked fruits and vegetables. ? Low-fat  dairy products.  Take vitamin and mineral supplements as told by your health care provider. Preventing dehydration  Start by sipping water or a special solution to prevent dehydration (oral rehydration solution, ORS). Urine that is clear or pale yellow means that you are getting enough fluid.  Try to drink at least 8-10 cups of fluid each day to help replace lost fluids.  You may add other liquids in addition to water, such as clear juice or decaffeinated sports drinks, as tolerated or as told by your health care provider.  Avoid drinks with caffeine, such as coffee, tea, or soft drinks.  Avoid alcohol. What foods are recommended?     The items listed may not be a complete list. Talk with your health care provider about what dietary choices are best for you. Grains White rice. White, [place], or pita breads (fresh or toasted), including plain rolls, buns, or bagels. White pasta. Saltine, soda, or graham crackers. Pretzels. Low-fiber cereal. Cooked cereals made with water (such as cornmeal, farina, or cream cereals). Plain muffins. Matzo. Melba toast. Zwieback. Vegetables Potatoes (without the skin). Most well-cooked and canned vegetables without skins or seeds. Tender lettuce. Fruits Apple sauce. Fruits canned in juice. Cooked apricots, cherries, grapefruit, peaches, pears, or plums. Fresh bananas and cantaloupe. Meats and other protein foods Baked or boiled chicken. Eggs. Tofu. Fish. Seafood. Smooth nut butters. Ground or well-cooked tender beef, ham, veal, lamb, pork, or poultry. Dairy Plain yogurt, kefir, and unsweetened liquid yogurt. Lactose-free milk, buttermilk, skim milk, or soy milk. Low-fat or nonfat hard cheese. Beverages  Water. Low-calorie sports drinks. Fruit juices without pulp. Strained tomato and vegetable juices. Decaffeinated teas. Sugar-free beverages not sweetened with sugar alcohols. Oral rehydration solutions, if approved by your health care provider. Seasoning and  other foods Bouillon, broth, or soups made from recommended foods. What foods are not recommended? The items listed may not be a complete list. Talk with your health care provider about what dietary choices are best for you. Grains Whole grain, whole wheat, bran, or rye breads, rolls, pastas, and crackers. Wild or brown rice. Whole grain or bran cereals. Barley. Oats and oatmeal. Corn tortillas or taco shells. Granola. Popcorn. Vegetables Raw vegetables. Fried vegetables. Cabbage, broccoli, Brussels sprouts, artichokes, baked beans, beet greens, corn, kale, legumes, peas, sweet potatoes, and yams. Potato skins. Cooked spinach and cabbage. Fruits Dried fruit, including raisins and dates. Raw fruits. Stewed or dried prunes. Canned fruits with syrup. Meat and other protein foods Fried or fatty meats. Deli meats. Chunky nut butters. Nuts and seeds. Beans and lentils. Tomasa Blase. Hot dogs. Sausage. Dairy High-fat cheeses. Whole milk, chocolate milk, and beverages made with milk, such as milk shakes. Half-and-half. Cream. sour cream. Ice cream. Beverages Caffeinated beverages (such as coffee, tea, soda, or energy drinks). Alcoholic beverages. Fruit juices with pulp. Prune juice. Soft drinks sweetened with high-fructose corn syrup or sugar alcohols. High-calorie sports drinks. Fats and oils Butter. Cream sauces. Margarine. Salad oils. Plain salad dressings. Olives. Avocados. Mayonnaise. Sweets and desserts Sweet rolls, doughnuts, and sweet breads. Sugar-free desserts sweetened with sugar alcohols such as xylitol and sorbitol. Seasoning and other foods Honey. Hot sauce. Chili powder. Gravy. Cream-based or milk-based soups. Pancakes and waffles. Summary  When you have diarrhea, the foods you eat and your eating habits are very important.  Make sure you get at least 8-10 cups of fluid each day, or enough to keep your urine clear or pale yellow.  Eat bland foods and gradually reintroduce healthy,  nutrient-rich foods as tolerated, or as told by your health care provider.  Avoid high-fiber, fried, greasy, or spicy foods. This information is not intended to replace advice given to you by your health care provider. Make sure you discuss any questions you have with your health care provider. Document Released: 04/06/2003 Document Revised: 01/12/2016 Document Reviewed: 01/12/2016 Elsevier Interactive Patient Education  2019 [employer].

## 2018-02-09 DIAGNOSIS — K529 Noninfective gastroenteritis and colitis, unspecified: Secondary | ICD-10-CM | POA: Insufficient documentation

## 2018-02-09 NOTE — Assessment & Plan Note (Signed)
Refilled patient's albuterol inhaler today per her request.  Patient's symptoms are stable and patient is not having an acute exacerbation at this time.

## 2018-02-09 NOTE — Assessment & Plan Note (Signed)
Patient with multiple episodes of nonbloody, nonbilious vomiting and nonbloody diarrhea.  Patient appears well-hydrated on exam.  Reports that her symptoms have resolved over the past few days.  Likely viral in etiology given that patient has only had a few days of symptoms and is improving.  If patient continues to have diarrhea and vomiting may need further work-up to evaluate for infectious versus inflammatory cause -Will give Zofran if patient develops any worsening nausea -Given handout for appropriate diet given that patient has had diarrhea. Patient encouraged to maintain hydration with drinks such as half apple juice and half water or Gatorade in order to also obtain electrolytes. -Patient given strict return precautions

## 2018-02-10 DIAGNOSIS — S30851A Superficial foreign body of abdominal wall, initial encounter: Secondary | ICD-10-CM | POA: Diagnosis not present

## 2018-02-10 DIAGNOSIS — J45909 Unspecified asthma, uncomplicated: Secondary | ICD-10-CM | POA: Insufficient documentation

## 2018-02-10 DIAGNOSIS — Y999 Unspecified external cause status: Secondary | ICD-10-CM | POA: Diagnosis not present

## 2018-02-10 DIAGNOSIS — Y929 Unspecified place or not applicable: Secondary | ICD-10-CM | POA: Insufficient documentation

## 2018-02-10 DIAGNOSIS — X58XXXA Exposure to other specified factors, initial encounter: Secondary | ICD-10-CM | POA: Insufficient documentation

## 2018-02-10 DIAGNOSIS — Y9389 Activity, other specified: Secondary | ICD-10-CM | POA: Insufficient documentation

## 2018-02-10 DIAGNOSIS — R1084 Generalized abdominal pain: Secondary | ICD-10-CM | POA: Insufficient documentation

## 2018-02-11 ENCOUNTER — Emergency Department (HOSPITAL_COMMUNITY): Payer: BLUE CROSS/BLUE SHIELD

## 2018-02-11 ENCOUNTER — Other Ambulatory Visit: Payer: Self-pay

## 2018-02-11 ENCOUNTER — Emergency Department (HOSPITAL_COMMUNITY)
Admission: EM | Admit: 2018-02-11 | Discharge: 2018-02-11 | Disposition: A | Discharge status: Home / Self Care | Payer: BLUE CROSS/BLUE SHIELD | Attending: Emergency Medicine | Admitting: Emergency Medicine

## 2018-02-11 ENCOUNTER — Encounter (HOSPITAL_COMMUNITY): Payer: Self-pay | Admitting: *Deleted

## 2018-02-11 DIAGNOSIS — S30851A Superficial foreign body of abdominal wall, initial encounter: Secondary | ICD-10-CM

## 2018-02-11 LAB — COMPREHENSIVE METABOLIC PANEL
ALT: 16 U/L (ref 0–44)
AST: 18 U/L (ref 15–41)
Albumin: 3.9 g/dL (ref 3.5–5.0)
Alkaline Phosphatase: 66 U/L (ref 38–126)
Anion gap: 9 (ref 5–15)
BUN: 7 mg/dL (ref 6–20)
CO2: 23 mmol/L (ref 22–32)
CREATININE: 0.68 mg/dL (ref 0.44–1.00)
Calcium: 8.9 mg/dL (ref 8.9–10.3)
Chloride: 107 mmol/L (ref 98–111)
GFR calc Af Amer: >60 mL/min (ref >60)
Glucose, Bld: 85 mg/dL (ref 70–99)
Potassium: 3.5 mmol/L (ref 3.5–5.1)
Sodium: 139 mmol/L (ref 135–145)
Total Bilirubin: 0.7 mg/dL (ref 0.3–1.2)
Total Protein: 8 g/dL (ref 6.5–8.1)

## 2018-02-11 LAB — URINALYSIS, ROUTINE W REFLEX MICROSCOPIC
BILIRUBIN URINE: NEGATIVE
Glucose, UA: NEGATIVE mg/dL
KETONES UR: NEGATIVE mg/dL
Leukocytes, UA: NEGATIVE
Nitrite: NEGATIVE
Protein, ur: NEGATIVE mg/dL
SPECIFIC GRAVITY, URINE: 1.026 (ref 1.005–1.030)
pH: 5 (ref 5.0–8.0)

## 2018-02-11 LAB — CBC
HCT: 41.2 % (ref 36.0–46.0)
Hemoglobin: 13.3 g/dL (ref 12.0–15.0)
MCH: 28 pg (ref 26.0–34.0)
MCHC: 32.3 g/dL (ref 30.0–36.0)
MCV: 86.7 fL (ref 80.0–100.0)
Platelets: 365 10*3/uL (ref 150–400)
RBC: 4.75 MIL/uL (ref 3.87–5.11)
RDW: 12.4 % (ref 11.5–15.5)
WBC: 12 10*3/uL — AB (ref 4.0–10.5)
nRBC: 0 % (ref 0.0–0.2)

## 2018-02-11 LAB — PREGNANCY, URINE: PREG TEST UR: NEGATIVE

## 2018-02-11 LAB — LIPASE, BLOOD: LIPASE: 47 U/L (ref 11–51)

## 2018-02-11 MED ORDER — IOPAMIDOL (ISOVUE-300) INJECTION 61%
INTRAVENOUS | Status: AC
  Filled 2018-02-11: qty 100

## 2018-02-11 MED ORDER — SODIUM CHLORIDE (PF) 0.9 % IJ SOLN
INTRAMUSCULAR | Status: AC
  Filled 2018-02-11: qty 50

## 2018-02-11 MED ORDER — IOPAMIDOL (ISOVUE-300) INJECTION 61%
100.0000 mL | Freq: Once | INTRAVENOUS | Status: AC | PRN
  Administered 2018-02-11: 100 mL via INTRAVENOUS

## 2018-02-11 MED ORDER — ONDANSETRON HCL 4 MG/2ML IJ SOLN
4.0000 mg | Freq: Once | INTRAMUSCULAR | Status: AC
  Administered 2018-02-11: 4 mg via INTRAVENOUS
  Filled 2018-02-11: qty 2

## 2018-02-11 MED ORDER — CEPHALEXIN 500 MG PO CAPS: 500.0000 mg | ORAL_CAPSULE | Freq: Two times a day (BID) | ORAL | 0 refills | Status: DC

## 2018-02-11 NOTE — ED Triage Notes (Signed)
Pt presents with diffuse abd pain that began today at around 14:00.  Pt denies emesis but endorses nausea.  Pt states she had a BM today.

## 2018-02-11 NOTE — ED Notes (Signed)
Pt has informed staff that her abdominal pain is due to the fact she injected 10cc of soap into her abdomen. Pt states that she read on [employer] that soap can "burn fat" so she decided to use a 10cc syringe which she says she bought online to inject the soap into her abdomen. Pt reports some numbness in abdomen.

## 2018-02-11 NOTE — ED Provider Notes (Signed)
[facility]-EMERGENCY DEPT Provider Note   CSN: [id number] Arrival date & time: 02/10/18  2315     History   Chief Complaint Chief Complaint  Patient presents with  . Abdominal Pain    HPI Renee [name] is a 23 y.o. female with past medical history of anxiety, asthma, schizoaffective disorder, presenting to the emergency department with complaint of generalized abdominal pain began yesterday evening.  Patient states around 2:30 in the afternoon yesterday she used a syringe which she purchased on [employer], and injected an unknown amount of Tide laundry detergent into the epigastric region of her abdomen.  She states this because she read somewhere online that soap "helps burn fat."  She states she began having gradually worsening generalized abdominal pain and states her skin feels slightly numb to her abdomen.  Endorses nausea without vomiting.  Normal bowel movement.  No fever.  States she did not do this with intent to harm herself or end her life.  The history is provided by the patient.    Past Medical History:  Diagnosis Date  . Anxiety   . Asthma     Patient Active Problem List   Diagnosis Date Noted  . Gastroenteritis 02/09/2018  . 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 Marijuana abuse 08/29/2017  . 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 Involuntary commitment   . Schizoaffective disorder (HCC)   . Depression 08/21/2017  . Severe episode of recurrent major depressive disorder, without psychotic features (HCC) 08/20/2017  . Asthma with exacerbation 04/26/2014  . Heart palpitations 12/15/2013  . Blurred vision, bilateral 12/15/2013  . Decreased visual acuity 12/15/2013  . Generalized anxiety disorder 12/15/2013  . Tension headache 12/15/2013  . Encounter for sexually transmitted disease counseling 12/29/2012  . General counseling and advice on female contraception 12/29/2012  .  Paresthesias 08/05/2012  . Tongue lesion 08/05/2012  . Asthma 06/22/2008  . CHILDHOOD OBESITY 08/27/2007  . H/O Kawasaki's disease 05/28/2007  . ALLERGIC RHINITIS 06/20/2006    History reviewed. No pertinent surgical history.   OB History   No obstetric history on file.      Home Medications    Prior to Admission medications   Medication Sig Start Date End Date Taking? Authorizing Provider  albuterol (PROAIR HFA) 108 (90 Base) MCG/ACT inhaler Inhale 2 puffs into the lungs every 6 (six) hours. For shortness of breath 02/04/18  Yes Talbert Forest, [place], DO  hydrOXYzine (ATARAX/VISTARIL) 50 MG tablet Take 1 tablet (50 mg) by mouth four times daily as needed: For anxiety/sleep 09/03/17  Yes Armandina Stammer I, NP  Melatonin 3 MG TABS Take 1 tablet (3 mg total) by mouth daily at 6 PM. For sleep 09/03/17  Yes Nwoko, Nicole Kindred I, NP  ondansetron (ZOFRAN) 4 MG tablet Take 1 tablet (4 mg total) by mouth every 8 (eight) hours as needed for nausea or vomiting. 02/04/18  Yes Talbert Forest, [place], DO  cephALEXin (KEFLEX) 500 MG capsule Take 1 capsule (500 mg total) by mouth 2 (two) times daily for 5 days. 02/11/18 02/16/18  Robinson, [place] N, PA-C    Family History No family history on file.  Social History Social History   Tobacco Use  . Smoking status: Never Smoker  . Smokeless tobacco: Never Used  Substance Use Topics  . Alcohol use: No  . Drug use: No     Allergies   Patient has no known allergies.   Review of Systems Review of Systems  Gastrointestinal: Positive for abdominal  pain and nausea. Negative for vomiting.  All other systems reviewed and are negative.    Physical Exam Updated Vital Signs BP 100/77 (BP Location: Left Arm)   Pulse 100   Temp 99.6 F (37.6 C) (Oral)   Resp 18   Ht 5\' 2"  (1.575 m)   Wt 96.6 kg   LMP 01/24/2018 Comment: negative urine pregnancy test 02-11-2018  SpO2 100%   BMI 38.96 kg/m   Physical Exam Vitals signs and nursing note reviewed.  Constitutional:       General: She is not in acute distress.    Appearance: She is well-developed. She is obese.  HENT:     Head: Normocephalic and atraumatic.  Eyes:     Conjunctiva/sclera: Conjunctivae normal.  Cardiovascular:     Rate and Rhythm: Regular rhythm. Tachycardia present.  Pulmonary:     Effort: Pulmonary effort is normal.     Breath sounds: Normal breath sounds.  Abdominal:     General: Bowel sounds are normal.     Palpations: Abdomen is soft.     Tenderness: There is generalized abdominal tenderness. There is no guarding.     Comments: Small punctate mark to the epigastrium.  There is very faint bruising surrounding this.  Generalized tenderness to the abdomen, slight rebound tenderness.  Tenderness is worse in the epigastrium and the right abdomen.  Skin:    General: Skin is warm.  Neurological:     Mental Status: She is alert.  Psychiatric:        Mood and Affect: Mood normal. Affect is flat.        Behavior: Behavior normal.        Thought Content: Thought content does not include suicidal ideation. Thought content does not include suicidal plan.        Judgment: Judgment is impulsive.      ED Treatments / Results  Labs (all labs ordered are listed, but only abnormal results are displayed) Labs Reviewed  CBC - Abnormal; Notable for the following components:      Result Value   WBC 12.0 (*)    All other components within normal limits  URINALYSIS, ROUTINE W REFLEX MICROSCOPIC - Abnormal; Notable for the following components:   APPearance CLOUDY (*)    Hgb urine dipstick SMALL (*)    Bacteria, UA RARE (*)    All other components within normal limits  LIPASE, BLOOD  COMPREHENSIVE METABOLIC PANEL  PREGNANCY, URINE    EKG None  Radiology Ct Abdomen Pelvis W Contrast  Result Date: 02/11/2018 CLINICAL DATA:  Right-sided abdominal pain after injecting soap into her abdomen. EXAM: CT ABDOMEN AND PELVIS WITH CONTRAST TECHNIQUE: Multidetector CT imaging of the abdomen and  pelvis was performed using the standard protocol following bolus administration of intravenous contrast. CONTRAST:  ISOVUE-300 IOPAMIDOL (ISOVUE-300) INJECTION 61% COMPARISON:  None. FINDINGS: Lower chest: Lung bases are clear. Heart size normal. No pericardial or pleural effusion. Distal esophagus is grossly unremarkable. Hepatobiliary: Liver and gallbladder are unremarkable. No biliary ductal dilatation. Pancreas: Negative. Spleen: Negative. Adrenals/Urinary Tract: Adrenal glands and right kidney are unremarkable. Somewhat ill-defined low-attenuation lesion in the lower pole left kidney measures 1.4 cm and is difficult to further characterize due to size. Ureters are decompressed. Bladder is unremarkable. Stomach/Bowel: Stomach, small bowel, appendix and colon are unremarkable. Vascular/Lymphatic: Circumaortic left renal vein. Vascular structures are otherwise unremarkable. No pathologically enlarged lymph nodes. Reproductive: Uterus is visualized. 3.1 cm low-attenuation lesion in the right ovary is likely a benign physiologic  cyst. Other: Small pelvic free fluid, likely physiologic. Mesenteries and peritoneum are unremarkable. Musculoskeletal: Fairly diffuse ventral abdominal and right pelvic wall edema with locules of subcutaneous air. No organized fluid collection. Osseous structures are unremarkable. IMPRESSION: Fairly diffuse ventral abdominal and right pelvic wall edema with locules of subcutaneous air, findings consistent with the provided history of subcutaneous injections. No organized fluid collection. No intra-abdominal acute findings. Electronically Signed   By: Leanna BattlesMelinda  Blietz M.D.   On: 02/11/2018 09:33   Dg Abdomen Acute W/chest  Result Date: 02/11/2018 CLINICAL DATA:  Abdominal pain after patient injected laundry detergent into abdomen EXAM: DG ABDOMEN ACUTE W/ 1V CHEST COMPARISON:  Abdomen series December 31, 2005; chest radiograph May 06, 2010 FINDINGS: PA chest: The lungs are clear.  The heart size and pulmonary vascularity are normal. No adenopathy. Supine and upright abdomen: There is moderate stool in the colon. There is no evident bowel dilatation or air-fluid level to suggest bowel obstruction. No free air. Lung bases clear. No radiopaque foreign body. IMPRESSION: No bowel obstruction or free air evident. Moderate stool in colon. No lung edema or consolidation. Electronically Signed   By: Bretta BangWilliam  Woodruff III M.D.   On: 02/11/2018 08:21    Procedures Procedures (including critical care time)  Medications Ordered in ED Medications  iopamidol (ISOVUE-300) 61 % injection (has no administration in time range)  sodium chloride (PF) 0.9 % injection (has no administration in time range)  iopamidol (ISOVUE-300) 61 % injection 100 mL (100 mLs Intravenous Contrast Given 02/11/18 0905)  ondansetron (ZOFRAN) injection 4 mg (4 mg Intravenous Given 02/11/18 1103)     Initial Impression / Assessment and Plan / ED Course  I have reviewed the triage vital signs and the nursing notes.  Pertinent labs & imaging results that were available during my care of the patient were reviewed by me and considered in my medical decision making (see chart for details).  Clinical Course as of Feb 11 1529  Wed Feb 11, 2018  [id number] Spoke with Waynetta SandyBeth at poison control.  She will review case and return call with recommendations.   [JR]  0802 Dr. Livia SnellenKopec with poison control agrees with plan of care. Warm compress to area. Medicate for pain. Local tissue irritation or infection.   [JR]  1007 Patient discussed with Marlyne BeardsJennings with general surgery.  Will discuss with his attending   [JR]    Clinical Course User Index [JR] Robinson, [place] N, PA-C    Patient presenting with generalized abdominal pain after injecting laundry detergent into her abdomen with hopes of burning fat.  Repeatedly denies any intent to harm herself or end her life with this action.  Endorses nausea without vomiting.  Abdominal exam  with generalized tenderness and small punctate wound.  Labs are reassuring.  Poison control without recommendations.  Upright abdominal x-ray without free air.  CT scan showing diffuse ventral abdominal wall edema with locules of subcutaneous air.  No evidence of intra-abdominal acute pathology.  Consult to general surgery for recommendations.  Patient discussed with Dr. Freida BusmanAllen.  Dr. Ezzard StandingNewman with general surgery reviewed patient's images, and recommending no surgical interventions at this time.  Recommends warm water soaks and close follow-up within 2 days.  Discussed recommendations with patient.  She will be discharged with symptomatic management and instructed to return the ED if she develops any skin changes or new or worsening symptoms.  Close PCP follow-up within 2 days.  Agreeable to plan and safe for discharge.  Discussed results, findings, treatment  and follow up. Patient advised of return precautions. Patient verbalized understanding and agreed with plan.   Final Clinical Impressions(s) / ED Diagnoses   Final diagnoses:  Foreign body of abdominal wall, initial encounter    ED Discharge Orders         Ordered    cephALEXin (KEFLEX) 500 MG capsule  2 times daily     02/11/18 1300           Robinson, [place] N, [place] 02/11/18 1531    Lorre Nick, MD 02/12/18 1614

## 2018-02-11 NOTE — Discharge Instructions (Addendum)
Soak in a warm bath daily.  You can take over the counter medications as needed for pain. Take the antibiotic as prescribed until gone. DO NOT INJECT OR INGEST ANY SUBSTANCE into your body without consulting your physician. Schedule a close follow up appointment with your primary care provider within 2 days for recheck. Return to the ED sooner for skin changes or new or worsening symptoms.

## 2018-02-13 ENCOUNTER — Other Ambulatory Visit: Payer: Self-pay

## 2018-02-13 ENCOUNTER — Emergency Department (HOSPITAL_COMMUNITY): Payer: BLUE CROSS/BLUE SHIELD

## 2018-02-13 ENCOUNTER — Encounter (HOSPITAL_COMMUNITY): Payer: Self-pay | Admitting: Radiology

## 2018-02-13 ENCOUNTER — Inpatient Hospital Stay (HOSPITAL_COMMUNITY)
Admission: EM | Admit: 2018-02-13 | Discharge: 2018-02-18 | DRG: 871 | Disposition: A | Discharge status: Home / Self Care | Payer: BLUE CROSS/BLUE SHIELD | Attending: Internal Medicine | Admitting: Internal Medicine

## 2018-02-13 DIAGNOSIS — J189 Pneumonia, unspecified organism: Secondary | ICD-10-CM | POA: Diagnosis present

## 2018-02-13 DIAGNOSIS — F411 Generalized anxiety disorder: Secondary | ICD-10-CM | POA: Diagnosis present

## 2018-02-13 DIAGNOSIS — J4520 Mild intermittent asthma, uncomplicated: Secondary | ICD-10-CM | POA: Diagnosis present

## 2018-02-13 DIAGNOSIS — D500 Iron deficiency anemia secondary to blood loss (chronic): Secondary | ICD-10-CM | POA: Diagnosis present

## 2018-02-13 DIAGNOSIS — A419 Sepsis, unspecified organism: Secondary | ICD-10-CM | POA: Diagnosis present

## 2018-02-13 DIAGNOSIS — J45909 Unspecified asthma, uncomplicated: Secondary | ICD-10-CM | POA: Diagnosis present

## 2018-02-13 DIAGNOSIS — L03311 Cellulitis of abdominal wall: Secondary | ICD-10-CM | POA: Diagnosis present

## 2018-02-13 DIAGNOSIS — R6510 Systemic inflammatory response syndrome (SIRS) of non-infectious origin without acute organ dysfunction: Secondary | ICD-10-CM

## 2018-02-13 DIAGNOSIS — F333 Major depressive disorder, recurrent, severe with psychotic symptoms: Secondary | ICD-10-CM | POA: Diagnosis present

## 2018-02-13 DIAGNOSIS — F259 Schizoaffective disorder, unspecified: Secondary | ICD-10-CM | POA: Diagnosis not present

## 2018-02-13 DIAGNOSIS — R509 Fever, unspecified: Secondary | ICD-10-CM | POA: Diagnosis not present

## 2018-02-13 DIAGNOSIS — R1084 Generalized abdominal pain: Secondary | ICD-10-CM | POA: Diagnosis present

## 2018-02-13 DIAGNOSIS — F419 Anxiety disorder, unspecified: Secondary | ICD-10-CM | POA: Diagnosis not present

## 2018-02-13 DIAGNOSIS — M793 Panniculitis, unspecified: Secondary | ICD-10-CM | POA: Diagnosis present

## 2018-02-13 DIAGNOSIS — E871 Hypo-osmolality and hyponatremia: Secondary | ICD-10-CM | POA: Diagnosis present

## 2018-02-13 DIAGNOSIS — R000 Tachycardia, unspecified: Secondary | ICD-10-CM | POA: Diagnosis present

## 2018-02-13 DIAGNOSIS — Z79890 Hormone replacement therapy (postmenopausal): Secondary | ICD-10-CM

## 2018-02-13 DIAGNOSIS — Z6838 Body mass index (BMI) 38.0-38.9, adult: Secondary | ICD-10-CM

## 2018-02-13 DIAGNOSIS — E669 Obesity, unspecified: Secondary | ICD-10-CM | POA: Diagnosis present

## 2018-02-13 DIAGNOSIS — Z79899 Other long term (current) drug therapy: Secondary | ICD-10-CM

## 2018-02-13 DIAGNOSIS — E876 Hypokalemia: Secondary | ICD-10-CM | POA: Diagnosis present

## 2018-02-13 DIAGNOSIS — D72829 Elevated white blood cell count, unspecified: Secondary | ICD-10-CM | POA: Diagnosis present

## 2018-02-13 DIAGNOSIS — R0602 Shortness of breath: Secondary | ICD-10-CM

## 2018-02-13 LAB — COMPREHENSIVE METABOLIC PANEL
ALT: 11 U/L (ref 0–44)
AST: 15 U/L (ref 15–41)
Albumin: 3.1 g/dL — ABNORMAL LOW (ref 3.5–5.0)
Alkaline Phosphatase: 55 U/L (ref 38–126)
Anion gap: 8 (ref 5–15)
BILIRUBIN TOTAL: 0.8 mg/dL (ref 0.3–1.2)
BUN: <5 mg/dL — ABNORMAL LOW (ref 6–20)
CO2: 24 mmol/L (ref 22–32)
Calcium: 8.4 mg/dL — ABNORMAL LOW (ref 8.9–10.3)
Chloride: 102 mmol/L (ref 98–111)
Creatinine, Ser: 0.74 mg/dL (ref 0.44–1.00)
GFR calc Af Amer: >60 mL/min (ref >60)
GFR calc non Af Amer: >60 mL/min (ref >60)
Glucose, Bld: 100 mg/dL — ABNORMAL HIGH (ref 70–99)
Potassium: 3.5 mmol/L (ref 3.5–5.1)
Sodium: 134 mmol/L — ABNORMAL LOW (ref 135–145)
TOTAL PROTEIN: 7.3 g/dL (ref 6.5–8.1)

## 2018-02-13 LAB — CBC WITH DIFFERENTIAL/PLATELET
Abs Immature Granulocytes: 0.06 10*3/uL (ref 0.00–0.07)
Basophils Absolute: 0 10*3/uL (ref 0.0–0.1)
Basophils Relative: 0 %
EOS ABS: 0.1 10*3/uL (ref 0.0–0.5)
Eosinophils Relative: 1 %
HCT: 34.1 % — ABNORMAL LOW (ref 36.0–46.0)
Hemoglobin: 11.1 g/dL — ABNORMAL LOW (ref 12.0–15.0)
Immature Granulocytes: 1 %
Lymphocytes Relative: 16 %
Lymphs Abs: 2 10*3/uL (ref 0.7–4.0)
MCH: 28.8 pg (ref 26.0–34.0)
MCHC: 32.6 g/dL (ref 30.0–36.0)
MCV: 88.3 fL (ref 80.0–100.0)
Monocytes Absolute: 0.6 10*3/uL (ref 0.1–1.0)
Monocytes Relative: 5 %
Neutro Abs: 9.9 10*3/uL — ABNORMAL HIGH (ref 1.7–7.7)
Neutrophils Relative %: 77 %
Platelets: 291 10*3/uL (ref 150–400)
RBC: 3.86 MIL/uL — ABNORMAL LOW (ref 3.87–5.11)
RDW: 12.5 % (ref 11.5–15.5)
WBC: 12.6 10*3/uL — ABNORMAL HIGH (ref 4.0–10.5)
nRBC: 0 % (ref 0.0–0.2)

## 2018-02-13 LAB — URINALYSIS, ROUTINE W REFLEX MICROSCOPIC
Bacteria, UA: NONE SEEN
Bilirubin Urine: NEGATIVE
Glucose, UA: NEGATIVE mg/dL
KETONES UR: NEGATIVE mg/dL
Leukocytes, UA: NEGATIVE
Nitrite: NEGATIVE
Protein, ur: NEGATIVE mg/dL
Specific Gravity, Urine: 1.033 — ABNORMAL HIGH (ref 1.005–1.030)
pH: 6 (ref 5.0–8.0)

## 2018-02-13 LAB — LIPASE, BLOOD: LIPASE: 26 U/L (ref 11–51)

## 2018-02-13 LAB — I-STAT BETA HCG BLOOD, ED (MC, WL, AP ONLY): I-stat hCG, quantitative: <5 m[IU]/mL (ref <5)

## 2018-02-13 LAB — I-STAT CG4 LACTIC ACID, ED: Lactic Acid, Venous: 1.1 mmol/L (ref 0.5–1.9)

## 2018-02-13 MED ORDER — SODIUM CHLORIDE (PF) 0.9 % IJ SOLN
INTRAMUSCULAR | Status: AC
  Filled 2018-02-13: qty 50

## 2018-02-13 MED ORDER — VANCOMYCIN HCL IN DEXTROSE 1-5 GM/200ML-% IV SOLN
1000.0000 mg | Freq: Two times a day (BID) | INTRAVENOUS | Status: DC
  Administered 2018-02-14 (×3): 1000 mg via INTRAVENOUS
  Filled 2018-02-13 (×2): qty 200

## 2018-02-13 MED ORDER — HYDROMORPHONE HCL 1 MG/ML IJ SOLN
1.0000 mg | Freq: Once | INTRAMUSCULAR | Status: AC
  Administered 2018-02-13: 1 mg via INTRAVENOUS
  Filled 2018-02-13: qty 1

## 2018-02-13 MED ORDER — ACETAMINOPHEN 650 MG RE SUPP: 650.0000 mg | Freq: Four times a day (QID) | RECTAL | Status: DC | PRN

## 2018-02-13 MED ORDER — HYDROXYZINE HCL 25 MG PO TABS
50.0000 mg | ORAL_TABLET | Freq: Four times a day (QID) | ORAL | Status: DC | PRN
  Administered 2018-02-15 – 2018-02-16 (×2): 50 mg via ORAL
  Filled 2018-02-13 (×2): qty 2

## 2018-02-13 MED ORDER — LACTATED RINGERS IV BOLUS: 1000.0000 mL | Freq: Once | INTRAVENOUS | Status: DC

## 2018-02-13 MED ORDER — ONDANSETRON HCL 4 MG PO TABS: 4.0000 mg | ORAL_TABLET | Freq: Four times a day (QID) | ORAL | Status: DC | PRN

## 2018-02-13 MED ORDER — SODIUM CHLORIDE 0.9 % IV SOLN
INTRAVENOUS | Status: DC
  Administered 2018-02-13 – 2018-02-15 (×6): via INTRAVENOUS

## 2018-02-13 MED ORDER — ACETAMINOPHEN 325 MG PO TABS
650.0000 mg | ORAL_TABLET | Freq: Once | ORAL | Status: AC
  Administered 2018-02-13: 650 mg via ORAL
  Filled 2018-02-13: qty 2

## 2018-02-13 MED ORDER — IPRATROPIUM-ALBUTEROL 0.5-2.5 (3) MG/3ML IN SOLN: 3.0000 mL | RESPIRATORY_TRACT | Status: DC | PRN

## 2018-02-13 MED ORDER — IOPAMIDOL (ISOVUE-300) INJECTION 61%
100.0000 mL | Freq: Once | INTRAVENOUS | Status: AC | PRN
  Administered 2018-02-13: 100 mL via INTRAVENOUS

## 2018-02-13 MED ORDER — ACETAMINOPHEN 325 MG PO TABS: 650.0000 mg | ORAL_TABLET | Freq: Four times a day (QID) | ORAL | Status: DC | PRN

## 2018-02-13 MED ORDER — POLYETHYLENE GLYCOL 3350 17 G PO PACK: 17.0000 g | PACK | Freq: Every day | ORAL | Status: DC | PRN

## 2018-02-13 MED ORDER — IBUPROFEN 200 MG PO TABS
600.0000 mg | ORAL_TABLET | Freq: Four times a day (QID) | ORAL | Status: DC | PRN
  Filled 2018-02-13: qty 3

## 2018-02-13 MED ORDER — VANCOMYCIN HCL 10 G IV SOLR
2000.0000 mg | Freq: Once | INTRAVENOUS | Status: AC
  Administered 2018-02-13: 2000 mg via INTRAVENOUS
  Filled 2018-02-13: qty 2000

## 2018-02-13 MED ORDER — ONDANSETRON HCL 4 MG/2ML IJ SOLN
4.0000 mg | Freq: Four times a day (QID) | INTRAMUSCULAR | Status: DC | PRN
  Administered 2018-02-13 – 2018-02-15 (×3): 4 mg via INTRAVENOUS
  Filled 2018-02-13 (×3): qty 2

## 2018-02-13 MED ORDER — ACETAMINOPHEN 325 MG PO TABS
650.0000 mg | ORAL_TABLET | Freq: Four times a day (QID) | ORAL | Status: DC | PRN
  Administered 2018-02-17: 650 mg via ORAL
  Filled 2018-02-13: qty 2

## 2018-02-13 MED ORDER — HYDROCODONE-ACETAMINOPHEN 5-325 MG PO TABS
1.0000 | ORAL_TABLET | ORAL | Status: DC | PRN
  Administered 2018-02-13 – 2018-02-14 (×4): 2 via ORAL
  Filled 2018-02-13 (×4): qty 2

## 2018-02-13 MED ORDER — MELATONIN 3 MG PO TABS
3.0000 mg | ORAL_TABLET | Freq: Every day | ORAL | Status: DC
  Administered 2018-02-13 – 2018-02-17 (×5): 3 mg via ORAL
  Filled 2018-02-13 (×5): qty 1

## 2018-02-13 MED ORDER — PIPERACILLIN-TAZOBACTAM 3.375 G IVPB
3.3750 g | Freq: Three times a day (TID) | INTRAVENOUS | Status: DC
  Administered 2018-02-13 – 2018-02-14 (×3): 3.375 g via INTRAVENOUS
  Filled 2018-02-13 (×4): qty 50

## 2018-02-13 MED ORDER — ALBUTEROL SULFATE (2.5 MG/3ML) 0.083% IN NEBU
2.5000 mg | INHALATION_SOLUTION | Freq: Four times a day (QID) | RESPIRATORY_TRACT | Status: DC
  Filled 2018-02-13: qty 3

## 2018-02-13 MED ORDER — KETOROLAC TROMETHAMINE 30 MG/ML IJ SOLN
30.0000 mg | Freq: Four times a day (QID) | INTRAMUSCULAR | Status: DC | PRN
  Administered 2018-02-13 – 2018-02-14 (×2): 30 mg via INTRAVENOUS
  Filled 2018-02-13 (×2): qty 1

## 2018-02-13 MED ORDER — PIPERACILLIN-TAZOBACTAM 3.375 G IVPB 30 MIN
3.3750 g | Freq: Once | INTRAVENOUS | Status: AC
  Administered 2018-02-13: 3.375 g via INTRAVENOUS
  Filled 2018-02-13: qty 50

## 2018-02-13 MED ORDER — CITALOPRAM HYDROBROMIDE 20 MG PO TABS
20.0000 mg | ORAL_TABLET | Freq: Every day | ORAL | Status: DC
  Administered 2018-02-13 – 2018-02-18 (×6): 20 mg via ORAL
  Filled 2018-02-13 (×6): qty 1

## 2018-02-13 MED ORDER — LACTATED RINGERS IV BOLUS
1000.0000 mL | Freq: Once | INTRAVENOUS | Status: AC
  Administered 2018-02-13: 1000 mL via INTRAVENOUS

## 2018-02-13 MED ORDER — IBUPROFEN 200 MG PO TABS: 600.0000 mg | ORAL_TABLET | Freq: Four times a day (QID) | ORAL | Status: DC | PRN

## 2018-02-13 MED ORDER — IOPAMIDOL (ISOVUE-300) INJECTION 61%
INTRAVENOUS | Status: AC
  Filled 2018-02-13: qty 100

## 2018-02-13 MED ORDER — DOCUSATE SODIUM 100 MG PO CAPS
100.0000 mg | ORAL_CAPSULE | Freq: Two times a day (BID) | ORAL | Status: DC
  Administered 2018-02-14 – 2018-02-18 (×7): 100 mg via ORAL
  Filled 2018-02-13 (×10): qty 1

## 2018-02-13 NOTE — ED Triage Notes (Signed)
Transported by [facility]-- abdominal pain, seen 2 days ago for same. Denies any n/v, had Keflex prescription filled but reports no relief of symptoms.

## 2018-02-13 NOTE — ED Notes (Signed)
Patient transported to CT 

## 2018-02-13 NOTE — Progress Notes (Signed)
Mother in room and upon checking HR it is noted that its 130.  Pt. States that she has no breathing difficulties and therefore, mother does not want her to have a tx.  Albuterol can increase HR and mother is aware of this.  RN, Brynda Greathouse notified.

## 2018-02-13 NOTE — Progress Notes (Signed)
Pharmacy Antibiotic Note  Renee [name] is a 23 y.o. female recently d/c from ED after injecting laundry detergent into abdomen in an attempt to burn fat. Patient is admitted on 02/13/2018 with abdominal wall cellulitis.  Pharmacy has been consulted for vancomycin and Zosyn dosing.  Plan: Zosyn 3.375 g EI q 8 hours.   Vancomycin 2000 mg iv loading dose followed by 1000 mg iv q 12 hours. AUC 518 IBW/TBW.   F/U renal function and culture results.      Temp (24hrs), Avg:101.1 F (38.4 C), Min:99.2 F (37.3 C), Max:103 F (39.4 C)  Recent Labs  Lab 02/11/18 0055 02/13/18 0953 02/13/18 1002  WBC 12.0* 12.6*  --   CREATININE 0.68 0.74  --   LATICACIDVEN  --   --  1.10    Estimated Creatinine Clearance: 121.7 mL/min (by C-G formula based on SCr of 0.74 mg/dL).    No Known Allergies  Antimicrobials this admission: 1/17 vancomycin >>  1/17 Zosyn >>   Dose adjustments this admission:   Microbiology results:  Thank you for allowing pharmacy to be a part of this patient's care.  Luisa Hart D 02/13/2018 1:31 PM

## 2018-02-13 NOTE — Progress Notes (Signed)
Received report from ED nurse at 12:15

## 2018-02-13 NOTE — Consult Note (Addendum)
Reason for Consult: Injection of foreign substance into the skin. Referring Physician: Dr. Sherlie Ban  JONALYN SEDLAK is an 23 y.o. female.    HPI: Patient is a 23 year old female with a history of psychotic disorder, severe recurrent major depression and schizophrenia.  She was hospitalized at behavioral health in August of last year with a diagnosis of worsening depression triggering suicidal ideations.  Her discharge diagnosis was severe recurrent depression with psychotic features.  She was seen here 2 days ago after injecting an unknown quantity of Tide laundry detergent, liquid form into her skin.  She does not know the volume of the syringes.  She bought them on [employer] and it sounds like she injected 2 large syringes of undiluted Tide into her subcutaneous fat, with the hopes this would reduce her abdominal size.  When she was here on the 02/11/18, CT scan was obtained.  This showed edema and stranding throughout the subcutaneous soft tissue of the abdominal wall.  She had no overt cellulitis, no areas of skin necrosis, no drainable fluid collections.  The study was consistent with cellulitis or panniculitis.  We saw the patient, & reviewed the CT, informally.  She was discharged home.  She was treated with some oral Keflex and return today with complaints of low-grade fever and her abdomen hurting much worse and the pain was now going to her lower back and into her right thigh.  Stated that her right thigh was numb.  She has been taking Advil for the pain.  She is currently having trouble sitting up or moving around secondary to the pain.  Work-up in the ED shows she was tachycardic current initial temperature was 99.2 but then she had a spike using a rectal temperature of 103.  Labs show an ongoing mild leukocytosis of 12.6.  It was 12.0 on 1/15.  CMP is essentially negative sodium is down a little bit to 134, glucose was 100, calcium was 8.4, albumin was 3.1 all other portions of the CMP  were normal.  Lactate is 1.10.  Urinalysis is unremarkable.  Urine pregnancy is negative.  Repeat CT scan was obtained.  This again shows extensive stranding/edema within the subcutaneous soft tissue of the abdominal wall.  Previous seen locules of gas were no longer visualized, but edema and stranding is stable.  Findings are again similar to cellulitis/panniculitis.  Exam today again shows some thickening of the skin, some minimal cellulitis.  There is no areas of fluctuance, there is no skin necrosis, no areas of skin disruption.  I cannot see her injection sites. We are asked to see.  Past Medical History:  Diagnosis Date  . Anxiety   . Asthma     No past surgical history on file.  No family history on file.  Social History:  reports that she has never smoked. She has never used smokeless tobacco. She reports that she does not drink alcohol or use drugs.  Allergies: No Known Allergies Prior to Admission medications   Medication Sig Start Date End Date Taking? Authorizing Provider  albuterol (PROAIR HFA) 108 (90 Base) MCG/ACT inhaler Inhale 2 puffs into the lungs every 6 (six) hours. For shortness of breath 02/04/18  Yes Talbert Forest, [place], DO  cephALEXin (KEFLEX) 500 MG capsule Take 1 capsule (500 mg total) by mouth 2 (two) times daily for 5 days. 02/11/18 02/16/18 Yes Roxan Hockey, [place] N, PA-C  citalopram (CELEXA) 20 MG tablet Take 20 mg by mouth daily. 01/31/18  Yes [provider]  hydrOXYzine (  VISTARIL) 50 MG capsule Take 50 mg by mouth at bedtime. 01/31/18  Yes [provider]  ibuprofen (ADVIL,MOTRIN) 200 MG tablet Take 600 mg by mouth every 6 (six) hours as needed for headache or mild pain.   Yes [provider]  ondansetron (ZOFRAN) 4 MG tablet Take 1 tablet (4 mg total) by mouth every 8 (eight) hours as needed for nausea or vomiting. 02/04/18  Yes Talbert ForestShirley, [place], DO  hydrOXYzine (ATARAX/VISTARIL) 50 MG tablet Take 1 tablet (50 mg) by mouth four times daily as  needed: For anxiety/sleep Patient not taking: Reported on 02/13/2018 09/03/17   Armandina StammerNwoko, Agnes I, NP  Melatonin 3 MG TABS Take 1 tablet (3 mg total) by mouth daily at 6 PM. For sleep Patient not taking: Reported on 02/13/2018 09/03/17   Armandina StammerNwoko, Agnes I, NP     Results for orders placed or performed during the hospital encounter of 02/13/18 (from the past 48 hour(s))  CBC with Differential     Status: Abnormal   Collection Time: 02/13/18  9:53 AM  Result Value Ref Range   WBC 12.6 (H) 4.0 - 10.5 K/uL   RBC 3.86 (L) 3.87 - 5.11 MIL/uL   Hemoglobin 11.1 (L) 12.0 - 15.0 g/dL   HCT 16.134.1 (L) 09.636.0 - 04.546.0 %   MCV 88.3 80.0 - 100.0 fL   MCH 28.8 26.0 - 34.0 pg   MCHC 32.6 30.0 - 36.0 g/dL   RDW 40.912.5 81.111.5 - 91.415.5 %   Platelets 291 150 - 400 K/uL   nRBC 0.0 0.0 - 0.2 %   Neutrophils Relative % 77 %   Neutro Abs 9.9 (H) 1.7 - 7.7 K/uL   Lymphocytes Relative 16 %   Lymphs Abs 2.0 0.7 - 4.0 K/uL   Monocytes Relative 5 %   Monocytes Absolute 0.6 0.1 - 1.0 K/uL   Eosinophils Relative 1 %   Eosinophils Absolute 0.1 0.0 - 0.5 K/uL   Basophils Relative 0 %   Basophils Absolute 0.0 0.0 - 0.1 K/uL   Immature Granulocytes 1 %   Abs Immature Granulocytes 0.06 0.00 - 0.07 K/uL    Comment: Performed at [facility], [address] [place], [place] [address]  Comprehensive metabolic panel     Status: Abnormal   Collection Time: 02/13/18  9:53 AM  Result Value Ref Range   Sodium 134 (L) 135 - 145 mmol/L   Potassium 3.5 3.5 - 5.1 mmol/L   Chloride 102 98 - 111 mmol/L   CO2 24 22 - 32 mmol/L   Glucose, Bld 100 (H) 70 - 99 mg/dL   BUN <5 (L) 6 - 20 mg/dL   Creatinine, Ser 6.210.74 0.44 - 1.00 mg/dL   Calcium 8.4 (L) 8.9 - 10.3 mg/dL   Total Protein 7.3 6.5 - 8.1 g/dL   Albumin 3.1 (L) 3.5 - 5.0 g/dL   AST 15 15 - 41 U/L   ALT 11 0 - 44 U/L   Alkaline Phosphatase 55 38 - 126 U/L   Total Bilirubin 0.8 0.3 - 1.2 mg/dL   GFR calc non Af Amer >60 >60 mL/min   GFR calc Af Amer >60 >60 mL/min    Anion gap 8 5 - 15    Comment: Performed at [facility], [address] [place], [place] [address]  Lipase, blood     Status: None   Collection Time: 02/13/18  9:53 AM  Result Value Ref Range   Lipase 26 11 - 51 U/L  Comment: Performed at [facility], [address] [place], [place] [address]  I-Stat beta hCG blood, ED     Status: None   Collection Time: 02/13/18 10:00 AM  Result Value Ref Range   I-stat hCG, quantitative <5.0 <5 mIU/mL   Comment 3            Comment:   GEST. AGE      CONC.  (mIU/mL)   <=1 WEEK        5 - 50     2 WEEKS       50 - 500     3 WEEKS       100 - 10,000     4 WEEKS     1,000 - 30,000        FEMALE AND NON-PREGNANT FEMALE:     LESS THAN 5 mIU/mL   I-Stat CG4 Lactic Acid, ED     Status: None   Collection Time: 02/13/18 10:02 AM  Result Value Ref Range   Lactic Acid, Venous 1.10 0.5 - 1.9 mmol/L    Ct Abdomen Pelvis W Contrast  Result Date: 02/13/2018 CLINICAL DATA:  Abdominal pain, nausea, vomiting EXAM: CT ABDOMEN AND PELVIS WITH CONTRAST TECHNIQUE: Multidetector CT imaging of the abdomen and pelvis was performed using the standard protocol following bolus administration of intravenous contrast. CONTRAST:  100mL ISOVUE-300 IOPAMIDOL (ISOVUE-300) INJECTION 61% COMPARISON:  02/11/2018 FINDINGS: Lower chest: Lung bases are clear. No effusions. Heart is normal size. Hepatobiliary: No focal hepatic abnormality. Gallbladder unremarkable. Pancreas: No focal abnormality or ductal dilatation. Spleen: No focal abnormality.  Normal size. Adrenals/Urinary Tract: Small cyst in the lower pole of the left kidney measures 1.8 cm. No hydronephrosis. Adrenal glands and urinary bladder unremarkable. Stomach/Bowel: Stomach, large and small bowel grossly unremarkable. Appendix normal Vascular/Lymphatic: No evidence of aneurysm or adenopathy. Reproductive: 4.3 cm right ovarian cyst, likely functional cyst. Uterus and left ovary  unremarkable. Other: No free fluid or free air. Extensive stranding/edema within the subcutaneous soft tissues of the abdominal wall. Previously seen locules of gas no longer visualized, but the edema/stranding is stable. This could reflect cellulitis or panniculitis. Musculoskeletal: No acute bony abnormality. IMPRESSION: Edema/stranding throughout the subcutaneous soft tissues of the abdominal wall a are similar prior study and could reflect cellulitis or panniculitis. Normal appendix. 4.1 cm right ovarian cyst. Electronically Signed   By: Charlett NoseKevin  Dover M.D.   On: 02/13/2018 10:54    Review of Systems  Constitutional: Positive for fever. Negative for chills, diaphoresis, malaise/fatigue and weight loss.  HENT: Negative.   Eyes: Negative.   Respiratory: Negative.   Cardiovascular: Negative.   Gastrointestinal: Negative.   Genitourinary: Negative.   Musculoskeletal: Negative.   Skin: Negative for itching and rash.       Patient injected 2 large syringes of Tide laundry detergent into her abdomen.  She complains of abdominal pain over the entire anterior abdomen and down her right leg.      Neurological: Negative.   Endo/Heme/Allergies: Negative.   Psychiatric/Behavioral: Negative.    Blood pressure 103/68, pulse (!) 134, temperature (!) 103 F (39.4 C), temperature source Rectal, resp. rate 17, last menstrual period 01/24/2018, SpO2 99 %. Physical Exam  Constitutional: She is oriented to person, place, and time. No distress.  Obese female in no acute distress.  Her entire abdomen is painful just getting up and walking.  Extends down to her right thigh.  HENT:  Head: Normocephalic and atraumatic.  Mouth/Throat: Oropharynx is clear and  moist. No oropharyngeal exudate.  Eyes: Right eye exhibits no discharge. Left eye exhibits no discharge. No scleral icterus.  Pupils are equal  Neck: Normal range of motion. Neck supple. No JVD present. No tracheal deviation present. No thyromegaly present.   Cardiovascular: Normal rate, regular rhythm, normal heart sounds and intact distal pulses.  No murmur heard. Respiratory: Effort normal and breath sounds normal. No respiratory distress. She has no wheezes. She has no rales. She exhibits no tenderness.  GI: Soft. Bowel sounds are normal. She exhibits no distension and no mass. There is abdominal tenderness. There is no rebound and no guarding.  Her abdomen is soft, it is tender all over.  It hurts to move around.  Skin is a little bit thickened some minimal erythema.  I do not see any injection site.  There is no areas of fluctuance.  Currently there are no areas of skin necrosis.  Musculoskeletal:        General: No tenderness or edema.  Lymphadenopathy:    She has no cervical adenopathy.  Neurological: She is alert and oriented to person, place, and time. No cranial nerve deficit.  Skin: Skin is warm and dry. No rash noted. She is not diaphoretic. There is erythema (Trace). No pallor.  She has some thickening of her skin and some minimal erythema of the abdominal wall.  It is all tender.  There is no skin necrosis, no areas of fluctuance, nothing that requires any surgical intervention at this time.  Psychiatric: She has a normal mood and affect. Her behavior is normal. Thought content normal.    Assessment/Plan: Reactive edema and stranding subcutaneous soft tissue of the abdominal wall. S/P injection of Tide liquid detergent -unknown volume/concentration into abdominal wall Hx very recurrent major depression Hx schizophrenia History of substance-induced psychotic disorder Asthma BMI 38.9  Plan: Was seen and evaluated by Dr. Ezzard Standing.  Currently there is no surgical issue involved.    She has extensive swelling and edema in the entire subcutaneous abdominal wall.  Superficially there is no areas of necrosis, skin breakdown, or fluctuant areas.  No surgical indications at this time.  The injection was not sterile and it was unsupervised.   Something she read on the Internet and tried on her own.    Dr. Ezzard Standing discussed this with the patient and her mother at the bedside.  Her father was also on the phone listening.  He did not rule out possible infection, skin breakdown, abscess, or skin necrosis at a later time.  She has been admitted by medicine and we will follow with you.  JENNINGS,WILLARD 02/13/2018, 11:30 AM   Agree with above. There is very little to do right now.   I expect that she will develop a localized abscess about 7 to 10 days after the injection. We will follow.  Ovidio Kin, MD, [facility] Surgery Pager: [phone] Office phone:  [phone]

## 2018-02-13 NOTE — Progress Notes (Signed)
A consult was received from an ED physician for Vancomycin and Zosyn per pharmacy dosing (for an indication other than meningitis). The patient's profile has been reviewed for ht/wt/allergies/indication/available labs. A one time order has been placed for the above antibiotics.  Further antibiotics/pharmacy consults should be ordered by admitting physician if indicated.                       Bernadene Person, PharmD, BCPS [phone] 02/13/2018, 10:22 AM

## 2018-02-13 NOTE — ED Notes (Signed)
Bed: WA11 Expected date:  Expected time:  Means of arrival:  Comments: EMS abdominal pain 

## 2018-02-13 NOTE — ED Notes (Signed)
ED TO INPATIENT HANDOFF REPORT  Name/Age/Gender Renee [name] 23 y.o. female  Code Status Code Status History    Date Active Date Inactive Code Status Order ID Comments User Context   08/28/2017 2114 09/03/2017 1633 Full Code [id number]  Jackelyn PolingBerry, Jason A, NP Inpatient   08/20/2017 1509 08/20/2017 1920 Full Code [id number]  Terrilee FilesButler, Michael C, MD ED      Home/SNF/Other Home  Chief Complaint abdominal pain   Level of Care/Admitting Diagnosis ED Disposition    ED Disposition Condition Comment   Admit  [facility] [[id number]]  Level of Care: Med-Surg [16]  Diagnosis: Cellulitis [[id number]]  Admitting Physician: Joycelyn DasOKHREL, LAXMAN [[id number]]  Attending Physician: Joycelyn DasPOKHREL, LAXMAN [[id number]]  PT Class (Do Not Modify): Observation [104]  PT Acc Code (Do Not Modify): Observation [10022]       Medical History Past Medical History:  Diagnosis Date  . Anxiety   . Asthma     Allergies No Known Allergies  IV Location/Drains/Wounds Patient Lines/Drains/Airways Status   Active Line/Drains/Airways    Name:   Placement date:   Placement time:   Site:   Days:   Peripheral IV 02/13/18 Right Forearm   02/13/18    1004    Forearm   less than 1          Labs/Imaging Results for orders placed or performed during the hospital encounter of 02/13/18 (from the past 48 hour(s))  CBC with Differential     Status: Abnormal   Collection Time: 02/13/18  9:53 AM  Result Value Ref Range   WBC 12.6 (H) 4.0 - 10.5 K/uL   RBC 3.86 (L) 3.87 - 5.11 MIL/uL   Hemoglobin 11.1 (L) 12.0 - 15.0 g/dL   HCT 95.234.1 (L) 84.136.0 - 32.446.0 %   MCV 88.3 80.0 - 100.0 fL   MCH 28.8 26.0 - 34.0 pg   MCHC 32.6 30.0 - 36.0 g/dL   RDW 40.112.5 02.711.5 - 25.315.5 %   Platelets 291 150 - 400 K/uL   nRBC 0.0 0.0 - 0.2 %   Neutrophils Relative % 77 %   Neutro Abs 9.9 (H) 1.7 - 7.7 K/uL   Lymphocytes Relative 16 %   Lymphs Abs 2.0 0.7 - 4.0 K/uL   Monocytes Relative 5 %   Monocytes Absolute 0.6 0.1 - 1.0  K/uL   Eosinophils Relative 1 %   Eosinophils Absolute 0.1 0.0 - 0.5 K/uL   Basophils Relative 0 %   Basophils Absolute 0.0 0.0 - 0.1 K/uL   Immature Granulocytes 1 %   Abs Immature Granulocytes 0.06 0.00 - 0.07 K/uL    Comment: Performed at [facility], [address] [place], [place] [address]  Comprehensive metabolic panel     Status: Abnormal   Collection Time: 02/13/18  9:53 AM  Result Value Ref Range   Sodium 134 (L) 135 - 145 mmol/L   Potassium 3.5 3.5 - 5.1 mmol/L   Chloride 102 98 - 111 mmol/L   CO2 24 22 - 32 mmol/L   Glucose, Bld 100 (H) 70 - 99 mg/dL   BUN <5 (L) 6 - 20 mg/dL   Creatinine, Ser 3.470.74 0.44 - 1.00 mg/dL   Calcium 8.4 (L) 8.9 - 10.3 mg/dL   Total Protein 7.3 6.5 - 8.1 g/dL   Albumin 3.1 (L) 3.5 - 5.0 g/dL   AST 15 15 - 41 U/L   ALT 11 0 - 44 U/L   Alkaline Phosphatase 55 38 - 126  U/L   Total Bilirubin 0.8 0.3 - 1.2 mg/dL   GFR calc non Af Amer >60 >60 mL/min   GFR calc Af Amer >60 >60 mL/min   Anion gap 8 5 - 15    Comment: Performed at [facility], [address] [place], [place] [address]  Lipase, blood     Status: None   Collection Time: 02/13/18  9:53 AM  Result Value Ref Range   Lipase 26 11 - 51 U/L    Comment: Performed at [facility], [address] [place], [place] [address]  I-Stat beta hCG blood, ED     Status: None   Collection Time: 02/13/18 10:00 AM  Result Value Ref Range   I-stat hCG, quantitative <5.0 <5 mIU/mL   Comment 3            Comment:   GEST. AGE      CONC.  (mIU/mL)   <=1 WEEK        5 - 50     2 WEEKS       50 - 500     3 WEEKS       100 - 10,000     4 WEEKS     1,000 - 30,000        FEMALE AND NON-PREGNANT FEMALE:     LESS THAN 5 mIU/mL   I-Stat CG4 Lactic Acid, ED     Status: None   Collection Time: 02/13/18 10:02 AM  Result Value Ref Range   Lactic Acid, Venous 1.10 0.5 - 1.9 mmol/L   Ct Abdomen Pelvis W Contrast  Result Date: 02/13/2018 CLINICAL  DATA:  Abdominal pain, nausea, vomiting EXAM: CT ABDOMEN AND PELVIS WITH CONTRAST TECHNIQUE: Multidetector CT imaging of the abdomen and pelvis was performed using the standard protocol following bolus administration of intravenous contrast. CONTRAST:  ISOVUE-300 IOPAMIDOL (ISOVUE-300) INJECTION 61% COMPARISON:  02/11/2018 FINDINGS: Lower chest: Lung bases are clear. No effusions. Heart is normal size. Hepatobiliary: No focal hepatic abnormality. Gallbladder unremarkable. Pancreas: No focal abnormality or ductal dilatation. Spleen: No focal abnormality.  Normal size. Adrenals/Urinary Tract: Small cyst in the lower pole of the left kidney measures 1.8 cm. No hydronephrosis. Adrenal glands and urinary bladder unremarkable. Stomach/Bowel: Stomach, large and small bowel grossly unremarkable. Appendix normal Vascular/Lymphatic: No evidence of aneurysm or adenopathy. Reproductive: 4.3 cm right ovarian cyst, likely functional cyst. Uterus and left ovary unremarkable. Other: No free fluid or free air. Extensive stranding/edema within the subcutaneous soft tissues of the abdominal wall. Previously seen locules of gas no longer visualized, but the edema/stranding is stable. This could reflect cellulitis or panniculitis. Musculoskeletal: No acute bony abnormality. IMPRESSION: Edema/stranding throughout the subcutaneous soft tissues of the abdominal wall a are similar prior study and could reflect cellulitis or panniculitis. Normal appendix. 4.1 cm right ovarian cyst. Electronically Signed   By: Charlett Nose M.D.   On: 02/13/2018 10:54   None  Pending Labs [employer] (From admission, onward)    Start     Ordered   Signed and Held  HIV antibody (Routine Testing)  Once,   R     Signed and Held   Signed and Armed forces training and education officer morning,   R     Signed and Held   Signed and Held  CBC  Tomorrow morning,   R     Signed and Held          Vitals/Pain Today's Vitals  02/13/18 1055  02/13/18 1116 02/13/18 1200 02/13/18 1222  BP:  103/68 111/63   Pulse:  (!) 134 (!) 122   Resp:  17 16   Temp: (!) 103 F (39.4 C)     TempSrc: Rectal     SpO2:  99% 100%   PainSc:    8     Isolation Precautions No active isolations  Medications Medications  vancomycin (VANCOCIN) 2,000 mg in sodium chloride 0.9 % 500 mL IVPB (2,000 mg Intravenous New Bag/Given 02/13/18 1121)  iopamidol (ISOVUE-300) 61 % injection (has no administration in time range)  sodium chloride (PF) 0.9 % injection (has no administration in time range)  lactated ringers bolus 1,000 mL (has no administration in time range)  lactated ringers bolus 1,000 mL (0 mLs Intravenous Stopped 02/13/18 1129)  HYDROmorphone (DILAUDID) injection 1 mg (1 mg Intravenous Given 02/13/18 1006)  acetaminophen (TYLENOL) tablet 650 mg (650 mg Oral Given 02/13/18 1026)  piperacillin-tazobactam (ZOSYN) IVPB 3.375 g (0 g Intravenous Stopped 02/13/18 1121)  iopamidol (ISOVUE-300) 61 % injection 100 mL (100 mLs Intravenous Contrast Given 02/13/18 1039)    Mobility walks

## 2018-02-13 NOTE — H&P (Signed)
[facility] History and Physical  Renee [name] VWU:[id number] DOB: 08/15/1995 DOA: 02/13/2018  Referring physician: ED  PCP: Howard PouchFeng, Lauren, MD   Chief Complaint: Abdominal pain, fever  HPI: Renee Salmymarri T Critzer is a 23 y.o. female with past medical history of schizophrenia, major recurrent depression, anxiety, asthma, morbid obesity presented to the hospital with generalized abdominal pain and fever.  Patient was seen in the hospital almost 2 days back after injecting an unknown quantity of Tide laundry detergent liquid form into the skin.  she did for reducing subcutaneous fat but did not have any intention to harm herself.  A CT scan was done on 02/11/2018 showed some edema and stranding in the subcutaneous tissue of the abdominal wall without any overt cellulitis or any fluid collection.  CT scan was consistent with cellulitis panniculitis.  She was seen by surgery at that time and no intervention was planned.  She was given some oral Keflex and discharged home.  Patient continued to have low-grade fever, chills and had worsening abdominal pain so she decided to come to the hospital.  Patient also complained of some numbness over the right thigh.  Patient does have mild shortness of breath with history of asthma in the past but denies chest pain, palpitation, dizziness or lightheadedness.  Denies syncope.  Patient complains of mild burning sensation after the end of micturition but no dysuria.  Patient mild nausea and vomiting but currently denies any nausea vomiting or diarrhea or diarrhea.  No changes in her bowel habits.  She complains of mild difficulty ambulating due to abdominal pain and numbness over the right thigh.  ED Course: In the ED, patient was noted to be tachycardic with a fever of 103 F.  Did have leukocytosis of 12.6 and mild hyponatremia.  Lactate was 1.10.  Urine pregnancy test was negative.  Urinalysis was negative.  Repeat CT scan was also obtained in the ED which  showed extensive stranding and edema in the subcutaneous tissue of the abdominal wall.  Due to high-grade fever of 103 leukocytosis persistent pain patient was then considered for admission to the hospital.  ED physician also consulted surgery for the same.  Review of Systems:  All systems were reviewed and were negative unless otherwise mentioned in the HPI  Past Medical History:  Diagnosis Date  . Anxiety   . Asthma    History reviewed. No pertinent surgical history.  Social History:  reports that she has never smoked. She has never used smokeless tobacco. She reports that she does not drink alcohol or use drugs.  No Known Allergies  No family history on file.   Prior to Admission medications   Medication Sig Start Date End Date Taking? Authorizing Provider  albuterol (PROAIR HFA) 108 (90 Base) MCG/ACT inhaler Inhale 2 puffs into the lungs every 6 (six) hours. For shortness of breath 02/04/18  Yes Talbert ForestShirley, [place], DO  cephALEXin (KEFLEX) 500 MG capsule Take 1 capsule (500 mg total) by mouth 2 (two) times daily for 5 days. 02/11/18 02/16/18 Yes Roxan Hockeyobinson, [place] N, PA-C  citalopram (CELEXA) 20 MG tablet Take 20 mg by mouth daily. 01/31/18  Yes [provider]  hydrOXYzine (VISTARIL) 50 MG capsule Take 50 mg by mouth at bedtime. 01/31/18  Yes [provider]  ibuprofen (ADVIL,MOTRIN) 200 MG tablet Take 600 mg by mouth every 6 (six) hours as needed for headache or mild pain.   Yes [provider]  ondansetron (ZOFRAN) 4 MG tablet Take 1 tablet (4 mg  total) by mouth every 8 (eight) hours as needed for nausea or vomiting. 02/04/18  Yes Talbert ForestShirley, [place], DO  hydrOXYzine (ATARAX/VISTARIL) 50 MG tablet Take 1 tablet (50 mg) by mouth four times daily as needed: For anxiety/sleep Patient not taking: Reported on 02/13/2018 09/03/17   Armandina StammerNwoko, Agnes I, NP  Melatonin 3 MG TABS Take 1 tablet (3 mg total) by mouth daily at 6 PM. For sleep Patient not taking: Reported on 02/13/2018 09/03/17    Armandina StammerNwoko, Agnes I, NP    Physical Exam: Vitals:   02/13/18 0903 02/13/18 1055 02/13/18 1116 02/13/18 1200  BP: 111/69  103/68 111/63  Pulse: (!) 130  (!) 134 (!) 122  Resp: 17  17 16   Temp: 99.2 F (37.3 C) (!) 103 F (39.4 C)    TempSrc: Oral Rectal    SpO2: 98%  99% 100%   Wt Readings from Last 3 Encounters:  02/11/18 96.6 kg  02/04/18 105.3 kg  08/27/17 93.9 kg   There is no height or weight on file to calculate BMI.  General:  Average built, not in obvious distress, morbidly obese HENT: Normocephalic, pupils equally reacting to light and accommodation.  No scleral pallor or icterus noted. Oral mucosa is moist.  Chest:  Clear breath sounds.  Diminished breath sounds bilaterally. No crackles or wheezes.  CVS: S1 &S2 heard.  Tachycardic.  No murmur.  Regular rate and rhythm. Abdomen: Soft, generalized tenderness over the upper abdomen with any induration guarding or rigidity, obese abdomen.  Striations to the superficial skin..  Bowel sounds are heard.  Liver is not palpable, no abdominal mass palpated Extremities: No cyanosis, clubbing or edema.  Peripheral pulses are palpable.  No diminished sensation or loss of power of the right lower extremity Psych: Alert, awake and oriented, normal mood CNS:  No cranial nerve deficits.  Power equal in all extremities.   No cerebellar signs.   Skin: Warm and dry.  No rashes noted.  Labs on Admission:  Basic Metabolic Panel: Recent Labs  Lab 02/11/18 0055 02/13/18 0953  NA 139 134*  K 3.5 3.5  CL 107 102  CO2 23 24  GLUCOSE 85 100*  BUN 7 <5*  CREATININE 0.68 0.74  CALCIUM 8.9 8.4*   Liver Function Tests: Recent Labs  Lab 02/11/18 0055 02/13/18 0953  AST 18 15  ALT 16 11  ALKPHOS 66 55  BILITOT 0.7 0.8  PROT 8.0 7.3  ALBUMIN 3.9 3.1*   Recent Labs  Lab 02/11/18 0055 02/13/18 0953  LIPASE 47 26   No results for input(s): AMMONIA in the last 168 hours. CBC: Recent Labs  Lab 02/11/18 0055 02/13/18 0953  WBC 12.0*  12.6*  NEUTROABS  --  9.9*  HGB 13.3 11.1*  HCT 41.2 34.1*  MCV 86.7 88.3  PLT 365 291   Cardiac Enzymes: No results for input(s): CKTOTAL, CKMB, CKMBINDEX, TROPONINI in the last 168 hours.  BNP (last 3 results) No results for input(s): BNP in the last 8760 hours.  ProBNP (last 3 results) No results for input(s): PROBNP in the last 8760 hours.  CBG: No results for input(s): GLUCAP in the last 168 hours.   Radiological Exams on Admission: Ct Abdomen Pelvis W Contrast  Result Date: 02/13/2018 CLINICAL DATA:  Abdominal pain, nausea, vomiting EXAM: CT ABDOMEN AND PELVIS WITH CONTRAST TECHNIQUE: Multidetector CT imaging of the abdomen and pelvis was performed using the standard protocol following bolus administration of intravenous contrast. CONTRAST:  100mL ISOVUE-300 IOPAMIDOL (ISOVUE-300) INJECTION 61% COMPARISON:  02/11/2018 FINDINGS: Lower chest: Lung bases are clear. No effusions. Heart is normal size. Hepatobiliary: No focal hepatic abnormality. Gallbladder unremarkable. Pancreas: No focal abnormality or ductal dilatation. Spleen: No focal abnormality.  Normal size. Adrenals/Urinary Tract: Small cyst in the lower pole of the left kidney measures 1.8 cm. No hydronephrosis. Adrenal glands and urinary bladder unremarkable. Stomach/Bowel: Stomach, large and small bowel grossly unremarkable. Appendix normal Vascular/Lymphatic: No evidence of aneurysm or adenopathy. Reproductive: 4.3 cm right ovarian cyst, likely functional cyst. Uterus and left ovary unremarkable. Other: No free fluid or free air. Extensive stranding/edema within the subcutaneous soft tissues of the abdominal wall. Previously seen locules of gas no longer visualized, but the edema/stranding is stable. This could reflect cellulitis or panniculitis. Musculoskeletal: No acute bony abnormality. IMPRESSION: Edema/stranding throughout the subcutaneous soft tissues of the abdominal wall a are similar prior study and could reflect  cellulitis or panniculitis. Normal appendix. 4.1 cm right ovarian cyst. Electronically Signed   By: Charlett Nose M.D.   On: 02/13/2018 10:54    EKG: Not available for review  Assessment/Plan Principal Problem:   Fever Active Problems:   Asthma   Schizoaffective disorder (HCC)   Abdominal wall cellulitis   Cellulitis   Fever, chills, abdominal pain likely from abdominal wall cellulitis from recent detergent injection..  CT scan showing extensive stranding and edema within the subcutaneous soft tissue.  Failed outpatient treatment with Keflex.  Surgery has been consulted from the ED recommend no surgical intervention.  Due to high-grade fever leukocytosis persistent symptoms patient will be placed in observation.  Patient received vancomycin and Zosyn in the ED.  Follow blood cultures.  We will continue with broad-spectrum antibiotic for now.  Focus on analgesia.  History of asthma with morbid obesity.  Patient uses inhalers at home will continue with that.  Currently compensated  Schizoaffective disorder, recurrent depression.  Patient does not have suicidal ideation.  Continue outpatient medication with Celexa and Vistaril   Consultant: General surgery Dr. Ezzard Standing  Code Status: Full code  DVT Prophylaxis: None  Antibiotics: Vancomycin and Zosyn  Family Communication:  Patients' condition and plan of care including tests being ordered have been discussed with the patient and mother who indicate understanding and agree with the plan.  Disposition Plan: Home likely tomorrow if fever subsides and culture is negative.  Severity of Illness: The appropriate patient status for this patient is OBSERVATION. Observation status is judged to be reasonable and necessary in order to provide the required intensity of service to ensure the patient's safety. The patient's presenting symptoms, physical exam findings, and initial radiographic and laboratory data in the context of their medical  condition is felt to place them at decreased risk for further clinical deterioration. Furthermore, it is anticipated that the patient will be medically stable for discharge from the hospital within 2 midnights of admission.    Signed, Joycelyn Das, MD [facility] 02/13/2018

## 2018-02-13 NOTE — ED Provider Notes (Signed)
[facility]-EMERGENCY DEPT Provider Note   CSN: [id number] Arrival date & time: 02/13/18  2536     History   Chief Complaint Chief Complaint  Patient presents with  . Abdominal Pain    HPI Renee [name] is a 23 y.o. female.  HPI  23 year old female presents with abdominal pain and chills.  2 days ago she injected Tide into her subcutaneous abdomen.  She states it was an attempt to lose weight because she read that the detergent would help breakdown the fat.  She was given Keflex and discharged home after work-up in the ED.  She states ever since she is gone home she is developed fever and chills.  Temperature was 100.2.  Her abdomen is hurting much worse and now little bit of pain in her low back and she is worried that the fluid has spread into her right leg.  She states her right lateral thigh is numb.  She is been taking Advil for pain.  The pain is so severe that she has a hard time sitting up or moving due to the pain in her abdomen and had to urinate in a cup at the bedside. She feels like her abdomen is swollen and there is something moving inside her abdomen when she moves.  Past Medical History:  Diagnosis Date  . Anxiety   . Asthma     Patient Active Problem List   Diagnosis Date Noted  . Gastroenteritis 02/09/2018  . 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 Marijuana abuse 08/29/2017  . 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 Involuntary commitment   . Schizoaffective disorder (HCC)   . Depression 08/21/2017  . Severe episode of recurrent major depressive disorder, without psychotic features (HCC) 08/20/2017  . Asthma with exacerbation 04/26/2014  . Heart palpitations 12/15/2013  . Blurred vision, bilateral 12/15/2013  . Decreased visual acuity 12/15/2013  . Generalized anxiety disorder 12/15/2013  . Tension headache 12/15/2013  . Encounter for sexually transmitted disease counseling  12/29/2012  . General counseling and advice on female contraception 12/29/2012  . Paresthesias 08/05/2012  . Tongue lesion 08/05/2012  . Asthma 06/22/2008  . CHILDHOOD OBESITY 08/27/2007  . H/O Kawasaki's disease 05/28/2007  . ALLERGIC RHINITIS 06/20/2006    No past surgical history on file.   OB History   No obstetric history on file.      Home Medications    Prior to Admission medications   Medication Sig Start Date End Date Taking? Authorizing Provider  albuterol (PROAIR HFA) 108 (90 Base) MCG/ACT inhaler Inhale 2 puffs into the lungs every 6 (six) hours. For shortness of breath 02/04/18   Shirley, [place], DO  atenolol (TENORMIN) 25 MG tablet Take 25 mg by mouth daily. 01/31/18   [provider]  cephALEXin (KEFLEX) 500 MG capsule Take 1 capsule (500 mg total) by mouth 2 (two) times daily for 5 days. 02/11/18 02/16/18  Robinson, [place] N, PA-C  citalopram (CELEXA) 20 MG tablet Take 20 mg by mouth daily. 01/31/18   [provider]  hydrOXYzine (ATARAX/VISTARIL) 50 MG tablet Take 1 tablet (50 mg) by mouth four times daily as needed: For anxiety/sleep 09/03/17   Armandina Stammer I, NP  hydrOXYzine (VISTARIL) 50 MG capsule Take 50 mg by mouth at bedtime. 01/31/18   [provider]  Melatonin 3 MG TABS Take 1 tablet (3 mg total) by mouth daily at 6 PM. For sleep 09/03/17   Sanjuana Kava, NP  ondansetron (  ZOFRAN) 4 MG tablet Take 1 tablet (4 mg total) by mouth every 8 (eight) hours as needed for nausea or vomiting. 02/04/18   Shirley, [place], DO    Family History No family history on file.  Social History Social History   Tobacco Use  . Smoking status: Never Smoker  . Smokeless tobacco: Never Used  Substance Use Topics  . Alcohol use: No  . Drug use: No     Allergies   Patient has no known allergies.   Review of Systems Review of Systems  Constitutional: Positive for chills and fever.  Gastrointestinal: Positive for abdominal distention, abdominal pain and  vomiting.  Musculoskeletal: Positive for back pain and myalgias.  Neurological: Positive for numbness. Negative for weakness.  All other systems reviewed and are negative.    Physical Exam Updated Vital Signs BP 111/69 (BP Location: Right Arm)   Pulse (!) 130   Temp 99.2 F (37.3 C) (Oral)   Resp 17   LMP 01/24/2018 Comment: negative urine pregnancy test 02-11-2018  SpO2 98%   Physical Exam Vitals signs and nursing note reviewed.  Constitutional:      Appearance: She is well-developed. She is obese. She is not diaphoretic.  HENT:     Head: Normocephalic and atraumatic.     Right Ear: External ear normal.     Left Ear: External ear normal.     Nose: Nose normal.  Eyes:     General:        Right eye: No discharge.        Left eye: No discharge.  Cardiovascular:     Rate and Rhythm: Regular rhythm. Tachycardia present.     Heart sounds: Normal heart sounds.  Pulmonary:     Effort: Pulmonary effort is normal.     Breath sounds: Normal breath sounds.  Abdominal:     Palpations: Abdomen is soft.     Tenderness: There is generalized abdominal tenderness.     Comments: Diffuse abdominal tenderness. Striations to the superficial skin. No skin color change though the abdominal wall feels warmer than the rest of her body  Musculoskeletal:     Thoracic back: She exhibits no tenderness.     Lumbar back: She exhibits no tenderness.     Right upper leg: She exhibits no tenderness.     Comments: No decreased sensation or focal tenderness to lumbar back or right thigh  Skin:    General: Skin is warm and dry.  Neurological:     Mental Status: She is alert.  Psychiatric:        Mood and Affect: Mood is not anxious.      ED Treatments / Results  Labs (all labs ordered are listed, but only abnormal results are displayed) Labs Reviewed  CBC WITH DIFFERENTIAL/PLATELET - Abnormal; Notable for the following components:      Result Value   WBC 12.6 (*)    RBC 3.86 (*)     Hemoglobin 11.1 (*)    HCT 34.1 (*)    Neutro Abs 9.9 (*)    All other components within normal limits  COMPREHENSIVE METABOLIC PANEL - Abnormal; Notable for the following components:   Sodium 134 (*)    Glucose, Bld 100 (*)    BUN <5 (*)    Calcium 8.4 (*)    Albumin 3.1 (*)    All other components within normal limits  LIPASE, BLOOD  URINALYSIS, ROUTINE W REFLEX MICROSCOPIC  HIV ANTIBODY (ROUTINE TESTING W REFLEX)  I-STAT  CG4 LACTIC ACID, ED  I-STAT BETA HCG BLOOD, ED ([facility], [facility], [facility] ONLY)    EKG None  Radiology Ct Abdomen Pelvis W Contrast  Result Date: 02/13/2018 CLINICAL DATA:  Abdominal pain, nausea, vomiting EXAM: CT ABDOMEN AND PELVIS WITH CONTRAST TECHNIQUE: Multidetector CT imaging of the abdomen and pelvis was performed using the standard protocol following bolus administration of intravenous contrast. CONTRAST:  100mL ISOVUE-300 IOPAMIDOL (ISOVUE-300) INJECTION 61% COMPARISON:  02/11/2018 FINDINGS: Lower chest: Lung bases are clear. No effusions. Heart is normal size. Hepatobiliary: No focal hepatic abnormality. Gallbladder unremarkable. Pancreas: No focal abnormality or ductal dilatation. Spleen: No focal abnormality.  Normal size. Adrenals/Urinary Tract: Small cyst in the lower pole of the left kidney measures 1.8 cm. No hydronephrosis. Adrenal glands and urinary bladder unremarkable. Stomach/Bowel: Stomach, large and small bowel grossly unremarkable. Appendix normal Vascular/Lymphatic: No evidence of aneurysm or adenopathy. Reproductive: 4.3 cm right ovarian cyst, likely functional cyst. Uterus and left ovary unremarkable. Other: No free fluid or free air. Extensive stranding/edema within the subcutaneous soft tissues of the abdominal wall. Previously seen locules of gas no longer visualized, but the edema/stranding is stable. This could reflect cellulitis or panniculitis. Musculoskeletal: No acute bony abnormality. IMPRESSION: Edema/stranding throughout the subcutaneous soft  tissues of the abdominal wall a are similar prior study and could reflect cellulitis or panniculitis. Normal appendix. 4.1 cm right ovarian cyst. Electronically Signed   By: Charlett NoseKevin  Dover M.D.   On: 02/13/2018 10:54    Procedures .Critical Care Performed by: Pricilla LovelessGoldston, Jaceion Aday, MD Authorized by: Pricilla LovelessGoldston, Adiel Mcnamara, MD   Critical care provider statement:    Critical care time (minutes):  30   Critical care time was exclusive of:  Separately billable procedures and treating other patients   Critical care was necessary to treat or prevent imminent or life-threatening deterioration of the following conditions:  Sepsis and shock   Critical care was time spent personally by me on the following activities:  Development of treatment plan with patient or surrogate, discussions with consultants, evaluation of patient's response to treatment, examination of patient, obtaining history from patient or surrogate, ordering and performing treatments and interventions, ordering and review of laboratory studies, ordering and review of radiographic studies, re-evaluation of patient's condition, pulse oximetry and review of old charts   (including critical care time)  Medications Ordered in ED Medications  iopamidol (ISOVUE-300) 61 % injection (has no administration in time range)  sodium chloride (PF) 0.9 % injection (has no administration in time range)  lactated ringers bolus 1,000 mL (has no administration in time range)  albuterol (PROVENTIL) (2.5 MG/3ML) 0.083% nebulizer solution 2.5 mg (has no administration in time range)  citalopram (CELEXA) tablet 20 mg (has no administration in time range)  hydrOXYzine (ATARAX/VISTARIL) tablet 50 mg (has no administration in time range)  Melatonin TABS 3 mg (has no administration in time range)  0.9 %  sodium chloride infusion ( Intravenous New Bag/Given 02/13/18 1350)  HYDROcodone-acetaminophen (NORCO/VICODIN) 5-325 MG per tablet 1-2 tablet (has no administration in time  range)  ketorolac (TORADOL) 30 MG/ML injection 30 mg (has no administration in time range)  docusate sodium (COLACE) capsule 100 mg (has no administration in time range)  polyethylene glycol (MIRALAX / GLYCOLAX) packet 17 g (has no administration in time range)  ondansetron (ZOFRAN) tablet 4 mg (has no administration in time range)    Or  ondansetron (ZOFRAN) injection 4 mg (has no administration in time range)  vancomycin (VANCOCIN) IVPB 1000 mg/200 mL premix (has no administration in  time range)  piperacillin-tazobactam (ZOSYN) IVPB 3.375 g (has no administration in time range)  acetaminophen (TYLENOL) tablet 650 mg (has no administration in time range)    Or  acetaminophen (TYLENOL) suppository 650 mg (has no administration in time range)  ibuprofen (ADVIL,MOTRIN) tablet 600 mg (has no administration in time range)  lactated ringers bolus 1,000 mL (0 mLs Intravenous Stopped 02/13/18 1129)  HYDROmorphone (DILAUDID) injection 1 mg (1 mg Intravenous Given 02/13/18 1006)  acetaminophen (TYLENOL) tablet 650 mg (650 mg Oral Given 02/13/18 1026)  vancomycin (VANCOCIN) 2,000 mg in sodium chloride 0.9 % 500 mL IVPB (2,000 mg Intravenous New Bag/Given 02/13/18 1121)  piperacillin-tazobactam (ZOSYN) IVPB 3.375 g (0 g Intravenous Stopped 02/13/18 1121)  iopamidol (ISOVUE-300) 61 % injection 100 mL (100 mLs Intravenous Contrast Given 02/13/18 1039)     Initial Impression / Assessment and Plan / ED Course  I have reviewed the triage vital signs and the nursing notes.  Pertinent labs & imaging results that were available during my care of the patient were reviewed by me and considered in my medical decision making (see chart for details).     Patient's rectal temperature is 103.  Given this and worsening symptoms with a caustic substance injected into her subcutaneous tissue, CT scan was ordered.  This may be a little bit worse but essentially is the same as 2 days ago.  General surgery was consulted.  No  emergent surgical indication and it does not appear that she is having any necrosis at this time.  Also discussed with poison control, who advised supportive care but unless necrotic tissue, surgical intervention would not benefit necessarily.  Hospitalist will admit for supportive care.  While the fever may be reactive and from a SIRS response, I think is reasonable to also treat as possible infection from the injection, and she was given broad antibiotics.  Final Clinical Impressions(s) / ED Diagnoses   Final diagnoses:  SIRS (systemic inflammatory response syndrome) [facility])    ED Discharge Orders    None       Pricilla Loveless, MD 02/13/18 [phone]

## 2018-02-14 DIAGNOSIS — E876 Hypokalemia: Secondary | ICD-10-CM | POA: Diagnosis present

## 2018-02-14 DIAGNOSIS — R1084 Generalized abdominal pain: Secondary | ICD-10-CM | POA: Diagnosis not present

## 2018-02-14 DIAGNOSIS — A419 Sepsis, unspecified organism: Principal | ICD-10-CM

## 2018-02-14 DIAGNOSIS — Z79899 Other long term (current) drug therapy: Secondary | ICD-10-CM | POA: Diagnosis not present

## 2018-02-14 DIAGNOSIS — E871 Hypo-osmolality and hyponatremia: Secondary | ICD-10-CM

## 2018-02-14 DIAGNOSIS — E669 Obesity, unspecified: Secondary | ICD-10-CM

## 2018-02-14 DIAGNOSIS — D500 Iron deficiency anemia secondary to blood loss (chronic): Secondary | ICD-10-CM | POA: Diagnosis present

## 2018-02-14 DIAGNOSIS — D72823 Leukemoid reaction: Secondary | ICD-10-CM

## 2018-02-14 DIAGNOSIS — J189 Pneumonia, unspecified organism: Secondary | ICD-10-CM | POA: Diagnosis present

## 2018-02-14 DIAGNOSIS — F333 Major depressive disorder, recurrent, severe with psychotic symptoms: Secondary | ICD-10-CM | POA: Diagnosis present

## 2018-02-14 DIAGNOSIS — R000 Tachycardia, unspecified: Secondary | ICD-10-CM | POA: Diagnosis present

## 2018-02-14 DIAGNOSIS — D72829 Elevated white blood cell count, unspecified: Secondary | ICD-10-CM | POA: Diagnosis present

## 2018-02-14 DIAGNOSIS — R509 Fever, unspecified: Secondary | ICD-10-CM | POA: Diagnosis not present

## 2018-02-14 DIAGNOSIS — R7989 Other specified abnormal findings of blood chemistry: Secondary | ICD-10-CM | POA: Diagnosis not present

## 2018-02-14 DIAGNOSIS — M793 Panniculitis, unspecified: Secondary | ICD-10-CM | POA: Diagnosis present

## 2018-02-14 DIAGNOSIS — F419 Anxiety disorder, unspecified: Secondary | ICD-10-CM | POA: Diagnosis present

## 2018-02-14 DIAGNOSIS — F411 Generalized anxiety disorder: Secondary | ICD-10-CM | POA: Diagnosis present

## 2018-02-14 DIAGNOSIS — Z6838 Body mass index (BMI) 38.0-38.9, adult: Secondary | ICD-10-CM | POA: Diagnosis not present

## 2018-02-14 DIAGNOSIS — Z79890 Hormone replacement therapy (postmenopausal): Secondary | ICD-10-CM | POA: Diagnosis not present

## 2018-02-14 DIAGNOSIS — L03311 Cellulitis of abdominal wall: Secondary | ICD-10-CM | POA: Diagnosis present

## 2018-02-14 DIAGNOSIS — J4520 Mild intermittent asthma, uncomplicated: Secondary | ICD-10-CM | POA: Diagnosis present

## 2018-02-14 LAB — CBC
HEMATOCRIT: 32.4 % — AB (ref 36.0–46.0)
HEMOGLOBIN: 10.4 g/dL — AB (ref 12.0–15.0)
MCH: 28.6 pg (ref 26.0–34.0)
MCHC: 32.1 g/dL (ref 30.0–36.0)
MCV: 89 fL (ref 80.0–100.0)
Platelets: 253 10*3/uL (ref 150–400)
RBC: 3.64 MIL/uL — ABNORMAL LOW (ref 3.87–5.11)
RDW: 12.5 % (ref 11.5–15.5)
WBC: 14.5 10*3/uL — ABNORMAL HIGH (ref 4.0–10.5)
nRBC: 0 % (ref 0.0–0.2)

## 2018-02-14 LAB — BASIC METABOLIC PANEL
Anion gap: 8 (ref 5–15)
BUN: 7 mg/dL (ref 6–20)
CHLORIDE: 103 mmol/L (ref 98–111)
CO2: 22 mmol/L (ref 22–32)
Calcium: 8.1 mg/dL — ABNORMAL LOW (ref 8.9–10.3)
Creatinine, Ser: 0.69 mg/dL (ref 0.44–1.00)
GFR calc Af Amer: >60 mL/min (ref >60)
GFR calc non Af Amer: >60 mL/min (ref >60)
Glucose, Bld: 111 mg/dL — ABNORMAL HIGH (ref 70–99)
Potassium: 3.3 mmol/L — ABNORMAL LOW (ref 3.5–5.1)
Sodium: 133 mmol/L — ABNORMAL LOW (ref 135–145)

## 2018-02-14 LAB — MRSA PCR SCREENING: MRSA by PCR: NEGATIVE

## 2018-02-14 MED ORDER — POTASSIUM CHLORIDE CRYS ER 20 MEQ PO TBCR
40.0000 meq | EXTENDED_RELEASE_TABLET | Freq: Once | ORAL | Status: AC
  Administered 2018-02-14: 40 meq via ORAL
  Filled 2018-02-14: qty 2

## 2018-02-14 MED ORDER — SODIUM CHLORIDE 0.9 % IV SOLN
INTRAVENOUS | Status: DC | PRN
  Administered 2018-02-14: 250 mL via INTRAVENOUS

## 2018-02-14 MED ORDER — KETOROLAC TROMETHAMINE 30 MG/ML IJ SOLN
15.0000 mg | Freq: Four times a day (QID) | INTRAMUSCULAR | Status: AC
  Administered 2018-02-14 – 2018-02-15 (×4): 15 mg via INTRAVENOUS
  Filled 2018-02-14 (×4): qty 1

## 2018-02-14 MED ORDER — HYDROCODONE-ACETAMINOPHEN 5-325 MG PO TABS
1.0000 | ORAL_TABLET | ORAL | Status: DC | PRN
  Administered 2018-02-14 – 2018-02-15 (×2): 1 via ORAL
  Filled 2018-02-14 (×2): qty 1

## 2018-02-14 NOTE — Progress Notes (Signed)
PROGRESS NOTE    [facility]  YFV:[id number] DOB: 10-31-1995 DOA: 02/13/2018 PCP: Everrett Coombe, MD   Brief Narrative:  Renee [name] is a 23 y.o. female with past medical history of schizophrenia, major recurrent depression, anxiety, asthma, morbid obesity presented to the hospital with generalized abdominal pain and fever.  Patient was seen in the hospital almost 2 days back after injecting an unknown quantity of Tide laundry detergent liquid form into the skin for the purpose to reduce abdominal size but no suicidal ideation. Surgery consulted by then but no intervention planned and she's discharged with po keflex. Pt returned with worsening diffused abdominal pain with radiation to back and thigh and decreased physical activity due to pain. She met sepsis criteria with high fever (103F), tachycardia, and leukocytosis. Started on IVF, iv Vancomycin and pending blood culture. Still no surgical plan.    Assessment & Plan:   Principal Problem:   Sepsis ([place]) Active Problems:   Asthma   Schizoaffective disorder ([place])   Abdominal wall cellulitis   Fever   Tachycardia   Leukocytosis   Hyponatremia   Hypokalemia   Obesity (BMI 30-39.9)   Generalized abdominal wall pain   #) sepsis due to abdominal wall cellulitis/panniculitis/inflammatory response to Tide detergent injection --pt has no diabetes background, therefore no need of gram negative coverage. Will d/c zosyn --continue iv vancomycin for now, pending blood culture which is negative for 24 hours. If remains negative for 48 hours, will de-escalate abx; will screen MRSA PCR --monitor fever curve, HR, and CBC -- continue IVF -- encourage OOB to chair and light physical activity as tolerated; will have PT eval as well --change Toradol injection to 15 mg qid for a day; change Norco to 1 tablet q4h PRN  #) Hypokalemia and hyponatremia --continue IV NS -- add po kcl 40 mEq X1, check BMP in am   DVT prophylaxis:  SCD Code Status: full code Family Communication: no family at bedside Disposition Plan: when sepsis criteria resolved and no positive blood culture for 48 hours, consider po antibiotics and home by then   Consultants:   General surgery   Procedures:none  Antimicrobials: vancomycin since 02/13/18; zosyn d/c'ed on 1/18  Subjective: Pt felt Norco PRN and Toradol injection help with her pain. T 100.9 last night but normal temperature this morning. Tachycardia improving with HR down from 130's to slightly above 100. Wbc slightly up from 12.6 to 14.5. blood culture negative so far. She denies feeling depressed or having suicidal ideation today.   Objective: Vitals:   02/13/18 1459 02/13/18 1838 02/13/18 2036 02/14/18 0527  BP: (!) 111/52  114/60 102/60  Pulse: (!) 126  (!) 128 (!) 107  Resp: 15  (!) 22 18  Temp: 98.9 F (37.2 C) 99.5 F (37.5 C) (!) 100.9 F (38.3 C) 99.1 F (37.3 C)  TempSrc: Oral Oral Oral Oral  SpO2: 99%  97% 94%    Intake/Output Summary (Last 24 hours) at 02/14/2018 1319 Last data filed at 02/14/2018 1200 Gross per 24 hour  Intake 2398.71 ml  Output -  Net 2398.71 ml   There were no vitals filed for this visit.  Examination:  General exam: Appears calm and comfortable; morbid obese  Respiratory system: Clear to auscultation. Respiratory effort normal. Cardiovascular system: S1 & S2 heard, tachycardia, regular. No JVD, murmurs, rubs, gallops or clicks. No pedal edema. Gastrointestinal system: Abdomen is nondistended, soft, no discharge or bleeding, invisible needle injection site, diffuse mild tenderness but guarding or ridigidity.  No organomegaly or masses felt. Normal bowel sounds heard. Central nervous system: Alert and oriented. No focal neurological deficits. Extremities: Symmetric 5 x 5 power. Skin: No rashes, lesions or ulcers Psychiatry: Judgement and insight appear normal. Mood & affect appropriate.     Data Reviewed: I have personally  reviewed following labs and imaging studies  CBC: Recent Labs  Lab 02/11/18 0055 02/13/18 0953 02/14/18 0331  WBC 12.0* 12.6* 14.5*  NEUTROABS  --  9.9*  --   HGB 13.3 11.1* 10.4*  HCT 41.2 34.1* 32.4*  MCV 86.7 88.3 89.0  PLT 365 291 093   Basic Metabolic Panel: Recent Labs  Lab 02/11/18 0055 02/13/18 0953 02/14/18 0331  NA 139 134* 133*  K 3.5 3.5 3.3*  CL 107 102 103  CO2 _0 GLUCOSE 85 100* 111*  BUN 7 <5* 7  CREATININE 0.68 0.74 0.69  CALCIUM 8.9 8.4* 8.1*   GFR: Estimated Creatinine Clearance: 121.7 mL/min (by C-G formula based on SCr of 0.69 mg/dL). Liver Function Tests: Recent Labs  Lab 02/11/18 0055 02/13/18 0953  AST 18 15  ALT 16 11  ALKPHOS 66 55  BILITOT 0.7 0.8  PROT 8.0 7.3  ALBUMIN 3.9 3.1*   Recent Labs  Lab 02/11/18 0055 02/13/18 0953  LIPASE 47 26   No results for input(s): AMMONIA in the last 168 hours. Coagulation Profile: No results for input(s): INR, PROTIME in the last 168 hours. Cardiac Enzymes: No results for input(s): CKTOTAL, CKMB, CKMBINDEX, TROPONINI in the last 168 hours. BNP (last 3 results) No results for input(s): PROBNP in the last 8760 hours. HbA1C: No results for input(s): HGBA1C in the last 72 hours. CBG: No results for input(s): GLUCAP in the last 168 hours. Lipid Profile: No results for input(s): CHOL, HDL, LDLCALC, TRIG, CHOLHDL, LDLDIRECT in the last 72 hours. Thyroid Function Tests: No results for input(s): TSH, T4TOTAL, FREET4, T3FREE, THYROIDAB in the last 72 hours. Anemia Panel: No results for input(s): VITAMINB12, FOLATE, FERRITIN, TIBC, IRON, RETICCTPCT in the last 72 hours. Sepsis Labs: Recent Labs  Lab 02/13/18 1002  LATICACIDVEN 1.10    No results found for this or any previous visit (from the past 240 hour(s)).       Radiology Studies: Ct Abdomen Pelvis W Contrast  Result Date: 02/13/2018 CLINICAL DATA:  Abdominal pain, nausea, vomiting EXAM: CT ABDOMEN AND PELVIS WITH CONTRAST  TECHNIQUE: Multidetector CT imaging of the abdomen and pelvis was performed using the standard protocol following bolus administration of intravenous contrast. CONTRAST:  175m ISOVUE-300 IOPAMIDOL (ISOVUE-300) INJECTION 61% COMPARISON:  02/11/2018 FINDINGS: Lower chest: Lung bases are clear. No effusions. Heart is normal size. Hepatobiliary: No focal hepatic abnormality. Gallbladder unremarkable. Pancreas: No focal abnormality or ductal dilatation. Spleen: No focal abnormality.  Normal size. Adrenals/Urinary Tract: Small cyst in the lower pole of the left kidney measures 1.8 cm. No hydronephrosis. Adrenal glands and urinary bladder unremarkable. Stomach/Bowel: Stomach, large and small bowel grossly unremarkable. Appendix normal Vascular/Lymphatic: No evidence of aneurysm or adenopathy. Reproductive: 4.3 cm right ovarian cyst, likely functional cyst. Uterus and left ovary unremarkable. Other: No free fluid or free air. Extensive stranding/edema within the subcutaneous soft tissues of the abdominal wall. Previously seen locules of gas no longer visualized, but the edema/stranding is stable. This could reflect cellulitis or panniculitis. Musculoskeletal: No acute bony abnormality. IMPRESSION: Edema/stranding throughout the subcutaneous soft tissues of the abdominal wall a are similar prior study and could reflect cellulitis or panniculitis. Normal appendix. 4.1 cm right  ovarian cyst. Electronically Signed   By: Rolm Baptise M.D.   On: 02/13/2018 10:54        Scheduled Meds: . citalopram  20 mg Oral Daily  . docusate sodium  100 mg Oral BID  . Melatonin  3 mg Oral q1800  . potassium chloride  40 mEq Oral Once   Continuous Infusions: . sodium chloride Stopped (02/14/18 1156)  . sodium chloride Stopped (02/14/18 1155)  . piperacillin-tazobactam (ZOSYN)  IV Stopped (02/14/18 1142)  . vancomycin 1,000 mg (02/14/18 1157)     LOS: 0 days    Time spent: 35 min    Paticia Stack, MD [facility] Pager [phone]  If 7PM-7AM, please contact night-coverage [email or web address] Password [email or web address] 02/14/2018, 1:19 PM

## 2018-02-14 NOTE — Progress Notes (Signed)
Assessment & Plan: Reactive edema and stranding subcutaneous soft tissue of the abdominal wall - history of injection of Tide liquid detergent into abdominal wall Elevated WBC > 14K this AM - continue to monitor Hx recurrent major depression Hx schizophrenia History of substance-induced psychotic disorder Asthma BMI 38.9  Exam shows relatively stable tenderness, edema, and induration of abdominal wall, right > left No sign of cellulitis or abscess at present Will follow - continue medical assessment and management        Darnell Level, MD       [facility]       Office: [phone]   Chief Complaint: Self-injection of detergent into abdominal wall  Subjective: Patient comfortable in bed, nursing in room.  Complains of abdominal pain, mild.  Objective: Vital signs in last 24 hours: Temp:  [98.9 F (37.2 C)-103 F (39.4 C)] 99.1 F (37.3 C) (01/18 0527) Pulse Rate:  [107-134] 107 (01/18 0527) Resp:  [15-22] 18 (01/18 0527) BP: (102-114)/(52-69) 102/60 (01/18 0527) SpO2:  [94 %-100 %] 94 % (01/18 0527) Last BM Date: (pta)  Intake/Output from previous day: 01/17 0701 - 01/18 0700 In: 1340 [P.O.:240; IV Piggyback:1100] Out: -  Intake/Output this shift: No intake/output data recorded.  Physical Exam: HEENT - sclerae clear, mucous membranes moist Abdomen - obese, mild to moderate tenderness lower abdominal wall, right>left; no cellulitis, no drainage, no wounds, no fluctuence Neuro - alert & oriented, no focal deficits  Lab Results:  Recent Labs    02/13/18 0953 02/14/18 0331  WBC 12.6* 14.5*  HGB 11.1* 10.4*  HCT 34.1* 32.4*  PLT 291 253   BMET Recent Labs    02/13/18 0953 02/14/18 0331  NA 134* 133*  K 3.5 3.3*  CL 102 103  CO2 24 22  GLUCOSE 100* 111*  BUN <5* 7  CREATININE 0.74 0.69  CALCIUM 8.4* 8.1*   PT/INR No results for input(s): LABPROT, INR in the last 72 hours. Comprehensive Metabolic Panel:    Component Value  Date/Time   NA 133 (L) 02/14/2018 0331   NA 134 (L) 02/13/2018 0953   K 3.3 (L) 02/14/2018 0331   K 3.5 02/13/2018 0953   CL 103 02/14/2018 0331   CL 102 02/13/2018 0953   CO2 22 02/14/2018 0331   CO2 24 02/13/2018 0953   BUN 7 02/14/2018 0331   BUN <5 (L) 02/13/2018 0953   CREATININE 0.69 02/14/2018 0331   CREATININE 0.74 02/13/2018 0953   CREATININE 0.54 10/26/2010 0906   GLUCOSE 111 (H) 02/14/2018 0331   GLUCOSE 100 (H) 02/13/2018 0953   CALCIUM 8.1 (L) 02/14/2018 0331   CALCIUM 8.4 (L) 02/13/2018 0953   AST 15 02/13/2018 0953   AST 18 02/11/2018 0055   ALT 11 02/13/2018 0953   ALT 16 02/11/2018 0055   ALKPHOS 55 02/13/2018 0953   ALKPHOS 66 02/11/2018 0055   BILITOT 0.8 02/13/2018 0953   BILITOT 0.7 02/11/2018 0055   PROT 7.3 02/13/2018 0953   PROT 8.0 02/11/2018 0055   ALBUMIN 3.1 (L) 02/13/2018 0953   ALBUMIN 3.9 02/11/2018 0055    Studies/Results: Ct Abdomen Pelvis W Contrast  Result Date: 02/13/2018 CLINICAL DATA:  Abdominal pain, nausea, vomiting EXAM: CT ABDOMEN AND PELVIS WITH CONTRAST TECHNIQUE: Multidetector CT imaging of the abdomen and pelvis was performed using the standard protocol following bolus administration of intravenous contrast. CONTRAST:  ISOVUE-300 IOPAMIDOL (ISOVUE-300) INJECTION 61% COMPARISON:  02/11/2018 FINDINGS: Lower chest: Lung bases are clear. No effusions.  Heart is normal size. Hepatobiliary: No focal hepatic abnormality. Gallbladder unremarkable. Pancreas: No focal abnormality or ductal dilatation. Spleen: No focal abnormality.  Normal size. Adrenals/Urinary Tract: Small cyst in the lower pole of the left kidney measures 1.8 cm. No hydronephrosis. Adrenal glands and urinary bladder unremarkable. Stomach/Bowel: Stomach, large and small bowel grossly unremarkable. Appendix normal Vascular/Lymphatic: No evidence of aneurysm or adenopathy. Reproductive: 4.3 cm right ovarian cyst, likely functional cyst. Uterus and left ovary unremarkable.  Other: No free fluid or free air. Extensive stranding/edema within the subcutaneous soft tissues of the abdominal wall. Previously seen locules of gas no longer visualized, but the edema/stranding is stable. This could reflect cellulitis or panniculitis. Musculoskeletal: No acute bony abnormality. IMPRESSION: Edema/stranding throughout the subcutaneous soft tissues of the abdominal wall a are similar prior study and could reflect cellulitis or panniculitis. Normal appendix. 4.1 cm right ovarian cyst. Electronically Signed   By: Charlett Nose M.D.   On: 02/13/2018 10:54      Darnell Level 02/14/2018  Patient ID: Renee [name], female   DOB: 1996-01-21, 21 y.o.   MRN: [id number]

## 2018-02-15 LAB — BASIC METABOLIC PANEL
Anion gap: 9 (ref 5–15)
BUN: 5 mg/dL — ABNORMAL LOW (ref 6–20)
CHLORIDE: 106 mmol/L (ref 98–111)
CO2: 21 mmol/L — ABNORMAL LOW (ref 22–32)
CREATININE: 0.7 mg/dL (ref 0.44–1.00)
Calcium: 7.8 mg/dL — ABNORMAL LOW (ref 8.9–10.3)
GFR calc Af Amer: >60 mL/min (ref >60)
GFR calc non Af Amer: >60 mL/min (ref >60)
Glucose, Bld: 101 mg/dL — ABNORMAL HIGH (ref 70–99)
Potassium: 3.6 mmol/L (ref 3.5–5.1)
Sodium: 136 mmol/L (ref 135–145)

## 2018-02-15 LAB — CBC
HCT: 28.9 % — ABNORMAL LOW (ref 36.0–46.0)
Hemoglobin: 9.8 g/dL — ABNORMAL LOW (ref 12.0–15.0)
MCH: 28.6 pg (ref 26.0–34.0)
MCHC: 33.9 g/dL (ref 30.0–36.0)
MCV: 84.3 fL (ref 80.0–100.0)
Platelets: 264 10*3/uL (ref 150–400)
RBC: 3.43 MIL/uL — ABNORMAL LOW (ref 3.87–5.11)
RDW: 12.3 % (ref 11.5–15.5)
WBC: 14 10*3/uL — ABNORMAL HIGH (ref 4.0–10.5)
nRBC: 0 % (ref 0.0–0.2)

## 2018-02-15 MED ORDER — HYDROCODONE-ACETAMINOPHEN 5-325 MG PO TABS
1.0000 | ORAL_TABLET | ORAL | Status: DC | PRN
  Administered 2018-02-15: 1 via ORAL
  Administered 2018-02-15 – 2018-02-18 (×14): 2 via ORAL
  Filled 2018-02-15 (×10): qty 2
  Filled 2018-02-15: qty 1
  Filled 2018-02-15 (×4): qty 2

## 2018-02-15 MED ORDER — KETOROLAC TROMETHAMINE 30 MG/ML IJ SOLN
15.0000 mg | Freq: Four times a day (QID) | INTRAMUSCULAR | Status: AC
  Administered 2018-02-15 – 2018-02-16 (×4): 15 mg via INTRAVENOUS
  Filled 2018-02-15 (×4): qty 1

## 2018-02-15 MED ORDER — CEFAZOLIN SODIUM-DEXTROSE 1-4 GM/50ML-% IV SOLN
1.0000 g | Freq: Three times a day (TID) | INTRAVENOUS | Status: DC
  Administered 2018-02-15 – 2018-02-17 (×6): 1 g via INTRAVENOUS
  Filled 2018-02-15 (×7): qty 50

## 2018-02-15 MED ORDER — ALUM & MAG HYDROXIDE-SIMETH 200-200-20 MG/5ML PO SUSP
15.0000 mL | Freq: Four times a day (QID) | ORAL | Status: DC | PRN
  Administered 2018-02-15: 15 mL via ORAL
  Filled 2018-02-15: qty 30

## 2018-02-15 NOTE — Progress Notes (Signed)
PROGRESS NOTE    Renee [name]  FYT:[id number] DOB: [birth year] DOA: 02/13/2018 PCP: Everrett Coombe, MD   Brief Narrative:  Renee [name] is a 23 y.o. female with past medical history of schizophrenia, major recurrent depression, anxiety, asthma, morbid obesity presented to the hospital with generalized abdominal pain and fever after self injecting laundry detergent to the abdomen.  Pt returned after 2 days of keflex outpatient treatment, with worsening diffused abdominal pain with radiation to back and thigh and decreased physical activity due to pain. She met sepsis criteria with high fever (103F), tachycardia, and leukocytosis. Started on IVF, iv ancef (MRSA PCR negative and she has no diabetes background). Still no surgical plan.    Assessment & Plan:   Principal Problem:   Sepsis ([place]) Active Problems:   Asthma   Schizoaffective disorder ([place])   Abdominal wall cellulitis   Fever   Tachycardia   Leukocytosis   Obesity (BMI 30-39.9)   Generalized abdominal wall pain   #) sepsis due to abdominal wall cellulitis/panniculitis/inflammatory response to Tide detergent injection --pt has no diabetes background, therefore no need of gram negative coverage. zoysn d/c'ed --MRSA PCR negative, change iv Vancomycin to iv Ancef, blood culture which is negative for 24 hours.  --monitor fever curve, HR, and CBC -- continue IVF -- encourage OOB to chair and light physical activity as tolerated; will have PT eval as well --continue Toradol injection to 15 mg qid for a day; change Norco to 1-2 tablet q4h PRN  #) Hypokalemia and hyponatremia -resolved   DVT prophylaxis: SCD Code Status: full code Family Communication: no family at bedside Disposition Plan: when sepsis criteria resolved and no positive blood culture for 48 hours, consider po antibiotics and home by then   Consultants:   General surgery   Procedures:none  Antimicrobials: vancomycin 02/13/18-02/15/18; zosyn d/c'ed  on 1/18; Ancef start on 02/15/18  Subjective: No fever yesterday but Tm 100.1 this morning. HR low 100's. Wbc still elevated at 14K. Blood culture negative X24 hrs. Pt described her abdominal pain moves and is now more localized at right flank area. She specifically asked for iv dilaudid but I informed that her pain is related to inflammatory response to detergent injection therefore iv Toradol is better than iv dilaudid  Objective: Vitals:   02/14/18 0527 02/14/18 1423 02/14/18 2113 02/15/18 0601  BP: 102/60 108/66 107/64 105/79  Pulse: (!) 107 93 (!) 117 (!) 104  Resp: _0 Temp: 99.1 F (37.3 C) 98.9 F (37.2 C) 99.8 F (37.7 C) 100.1 F (37.8 C)  TempSrc: Oral Oral Oral Oral  SpO2: 94% 98% 97% 97%    Intake/Output Summary (Last 24 hours) at 02/15/2018 1121 Last data filed at 02/15/2018 0900 Gross per 24 hour  Intake 4355.4 ml  Output -  Net 4355.4 ml   There were no vitals filed for this visit.  Examination:  General exam: Appears calm and comfortable; morbid obese  Respiratory system: Clear to auscultation. Respiratory effort normal. Cardiovascular system: S1 & S2 heard, tachycardia, regular. No JVD, murmurs, rubs, gallops or clicks. No pedal edema. Gastrointestinal system: increased tenderness, edema, and induration of the right side of the abdominal wall, improvement on the left; no discharge or bleeding, invisible needle injection site. No skin rash to suggest cellulitis or abscess. No organomegaly or masses felt. Normal bowel sounds heard. Central nervous system: Alert and oriented. No focal neurological deficits. Extremities: Symmetric 5 x 5 power. Skin: No rashes, lesions or ulcers Psychiatry: Judgement  and insight appear normal. Mood & affect appropriate.     Data Reviewed: I have personally reviewed following labs and imaging studies  CBC: Recent Labs  Lab 02/11/18 0055 02/13/18 0953 02/14/18 0331 02/15/18 0514  WBC 12.0* 12.6* 14.5* 14.0*    NEUTROABS  --  9.9*  --   --   HGB 13.3 11.1* 10.4* 9.8*  HCT 41.2 34.1* 32.4* 28.9*  MCV 86.7 88.3 89.0 84.3  PLT 365 291 253 629   Basic Metabolic Panel: Recent Labs  Lab 02/11/18 0055 02/13/18 0953 02/14/18 0331 02/15/18 0514  NA 139 134* 133* 136  K 3.5 3.5 3.3* 3.6  CL 107 102 103 106  CO2 _0 21*  GLUCOSE 85 100* 111* 101*  BUN 7 <5* 7 5*  CREATININE 0.68 0.74 0.69 0.70  CALCIUM 8.9 8.4* 8.1* 7.8*   GFR: Estimated Creatinine Clearance: 121.7 mL/min (by C-G formula based on SCr of 0.7 mg/dL). Liver Function Tests: Recent Labs  Lab 02/11/18 0055 02/13/18 0953  AST 18 15  ALT 16 11  ALKPHOS 66 55  BILITOT 0.7 0.8  PROT 8.0 7.3  ALBUMIN 3.9 3.1*   Recent Labs  Lab 02/11/18 0055 02/13/18 0953  LIPASE 47 26   No results for input(s): AMMONIA in the last 168 hours. Coagulation Profile: No results for input(s): INR, PROTIME in the last 168 hours. Cardiac Enzymes: No results for input(s): CKTOTAL, CKMB, CKMBINDEX, TROPONINI in the last 168 hours. BNP (last 3 results) No results for input(s): PROBNP in the last 8760 hours. HbA1C: No results for input(s): HGBA1C in the last 72 hours. CBG: No results for input(s): GLUCAP in the last 168 hours. Lipid Profile: No results for input(s): CHOL, HDL, LDLCALC, TRIG, CHOLHDL, LDLDIRECT in the last 72 hours. Thyroid Function Tests: No results for input(s): TSH, T4TOTAL, FREET4, T3FREE, THYROIDAB in the last 72 hours. Anemia Panel: No results for input(s): VITAMINB12, FOLATE, FERRITIN, TIBC, IRON, RETICCTPCT in the last 72 hours. Sepsis Labs: Recent Labs  Lab 02/13/18 1002  LATICACIDVEN 1.10    Recent Results (from the past 240 hour(s))  Culture, blood (routine x 2)     Status: None (Preliminary result)   Collection Time: 02/14/18  8:28 AM  Result Value Ref Range Status   Specimen Description   Final    BLOOD BLOOD LEFT FOREARM Performed at [facility], [place] [address] [place], [place] [address]     Special Requests   Final    BOTTLES DRAWN AEROBIC ONLY Blood Culture adequate volume Performed at [place] [address], [place], [place] [address]    Culture PENDING  Incomplete   Report Status PENDING  Incomplete  MRSA PCR Screening     Status: None   Collection Time: 02/14/18  2:53 PM  Result Value Ref Range Status   MRSA by PCR NEGATIVE NEGATIVE Final    Comment:        The GeneXpert MRSA Assay (FDA approved for NASAL specimens only), is one component of a comprehensive MRSA colonization surveillance program. It is not intended to diagnose MRSA infection nor to guide or monitor treatment for MRSA infections. Performed at [facility], [place] [address], [place], [place] [address]          Radiology Studies: No results found.      Scheduled Meds: . citalopram  20 mg Oral Daily  . docusate sodium  100 mg Oral BID  . ketorolac  15 mg Intravenous Q6H  .  Melatonin  3 mg Oral q1800   Continuous Infusions: . sodium chloride 100 mL/hr at 02/15/18 0700  . sodium chloride Stopped (02/14/18 1155)  .  ceFAZolin (ANCEF) IV       LOS: 1 day    Time spent: 35 min    Paticia Stack, MD [facility] Pager [phone]  If 7PM-7AM, please contact night-coverage [email or web address] Password [facility] 02/15/2018, 11:21 AM

## 2018-02-15 NOTE — Progress Notes (Signed)
Assessment & Plan: Reactive edema and stranding subcutaneous soft tissue of the abdominal wall - history of injection of Tide liquid detergent into abdominal wall Elevated WBC = 14K this AM - continue to monitor Hx recurrent major depression Hx schizophrenia History of substance-induced psychotic disorder Asthma BMI 38.9  Exam shows increased tenderness, edema, and induration of the right side of the abdominal wall, improvement on the left No sign of cellulitis or abscess at present Will follow - continue medical assessment and management        Darnell Level, MD       [facility]       Office: [phone]   Chief Complaint: Self-injection of detergent into abdominal wall  Subjective: Patient in bed, comfortable.  Nursing at bedside.  Taking regular diet.  Objective: Vital signs in last 24 hours: Temp:  [98.9 F (37.2 C)-100.1 F (37.8 C)] 100.1 F (37.8 C) (01/19 0601) Pulse Rate:  [93-117] 104 (01/19 0601) Resp:  [16-18] 18 (01/19 0601) BP: (105-108)/(64-79) 105/79 (01/19 0601) SpO2:  [97 %-98 %] 97 % (01/19 0601) Last BM Date: 02/13/18  Intake/Output from previous day: 01/18 0701 - 01/19 0700 In: 3995.4 [I.V.:3229.8; IV Piggyback:765.6] Out: -  Intake/Output this shift: No intake/output data recorded.  Physical Exam: HEENT - sclerae clear, mucous membranes moist Neck - soft Abdomen - abd wall with induration, tenderness, soft tissue swelling on right, improved on left abd wall; no fluctuence; no cellulitis Ext - no edema, non-tender Neuro - alert & oriented, no focal deficits  Lab Results:  Recent Labs    02/14/18 0331 02/15/18 0514  WBC 14.5* 14.0*  HGB 10.4* 9.8*  HCT 32.4* 28.9*  PLT 253 264   BMET Recent Labs    02/14/18 0331 02/15/18 0514  NA 133* 136  K 3.3* 3.6  CL 103 106  CO2 22 21*  GLUCOSE 111* 101*  BUN 7 5*  CREATININE 0.69 0.70  CALCIUM 8.1* 7.8*   PT/INR No results for input(s): LABPROT, INR in the  last 72 hours. Comprehensive Metabolic Panel:    Component Value Date/Time   NA 136 02/15/2018 0514   NA 133 (L) 02/14/2018 0331   K 3.6 02/15/2018 0514   K 3.3 (L) 02/14/2018 0331   CL 106 02/15/2018 0514   CL 103 02/14/2018 0331   CO2 21 (L) 02/15/2018 0514   CO2 22 02/14/2018 0331   BUN 5 (L) 02/15/2018 0514   BUN 7 02/14/2018 0331   CREATININE 0.70 02/15/2018 0514   CREATININE 0.69 02/14/2018 0331   CREATININE 0.54 10/26/2010 0906   GLUCOSE 101 (H) 02/15/2018 0514   GLUCOSE 111 (H) 02/14/2018 0331   CALCIUM 7.8 (L) 02/15/2018 0514   CALCIUM 8.1 (L) 02/14/2018 0331   AST 15 02/13/2018 0953   AST 18 02/11/2018 0055   ALT 11 02/13/2018 0953   ALT 16 02/11/2018 0055   ALKPHOS 55 02/13/2018 0953   ALKPHOS 66 02/11/2018 0055   BILITOT 0.8 02/13/2018 0953   BILITOT 0.7 02/11/2018 0055   PROT 7.3 02/13/2018 0953   PROT 8.0 02/11/2018 0055   ALBUMIN 3.1 (L) 02/13/2018 0953   ALBUMIN 3.9 02/11/2018 0055    Studies/Results: Ct Abdomen Pelvis W Contrast  Result Date: 02/13/2018 CLINICAL DATA:  Abdominal pain, nausea, vomiting EXAM: CT ABDOMEN AND PELVIS WITH CONTRAST TECHNIQUE: Multidetector CT imaging of the abdomen and pelvis was performed using the standard protocol following bolus administration of intravenous contrast. CONTRAST:  ISOVUE-300 IOPAMIDOL (ISOVUE-300)  INJECTION 61% COMPARISON:  02/11/2018 FINDINGS: Lower chest: Lung bases are clear. No effusions. Heart is normal size. Hepatobiliary: No focal hepatic abnormality. Gallbladder unremarkable. Pancreas: No focal abnormality or ductal dilatation. Spleen: No focal abnormality.  Normal size. Adrenals/Urinary Tract: Small cyst in the lower pole of the left kidney measures 1.8 cm. No hydronephrosis. Adrenal glands and urinary bladder unremarkable. Stomach/Bowel: Stomach, large and small bowel grossly unremarkable. Appendix normal Vascular/Lymphatic: No evidence of aneurysm or adenopathy. Reproductive: 4.3 cm right ovarian  cyst, likely functional cyst. Uterus and left ovary unremarkable. Other: No free fluid or free air. Extensive stranding/edema within the subcutaneous soft tissues of the abdominal wall. Previously seen locules of gas no longer visualized, but the edema/stranding is stable. This could reflect cellulitis or panniculitis. Musculoskeletal: No acute bony abnormality. IMPRESSION: Edema/stranding throughout the subcutaneous soft tissues of the abdominal wall a are similar prior study and could reflect cellulitis or panniculitis. Normal appendix. 4.1 cm right ovarian cyst. Electronically Signed   By: Charlett Nose M.D.   On: 02/13/2018 10:54      Darnell Level 02/15/2018  Patient ID: Renee [name], female   DOB: [birth year], 22 y.o.   MRN: [id number]

## 2018-02-16 LAB — CBC
HCT: 27.7 % — ABNORMAL LOW (ref 36.0–46.0)
Hemoglobin: 9.2 g/dL — ABNORMAL LOW (ref 12.0–15.0)
MCH: 28.3 pg (ref 26.0–34.0)
MCHC: 33.2 g/dL (ref 30.0–36.0)
MCV: 85.2 fL (ref 80.0–100.0)
Platelets: 259 10*3/uL (ref 150–400)
RBC: 3.25 MIL/uL — ABNORMAL LOW (ref 3.87–5.11)
RDW: 12.3 % (ref 11.5–15.5)
WBC: 9.9 10*3/uL (ref 4.0–10.5)
nRBC: 0 % (ref 0.0–0.2)

## 2018-02-16 LAB — PROCALCITONIN: Procalcitonin: <0.1 ng/mL

## 2018-02-16 LAB — IRON AND TIBC
Iron: 20 ug/dL — ABNORMAL LOW (ref 28–170)
Saturation Ratios: 9 % — ABNORMAL LOW (ref 10.4–31.8)
TIBC: 213 ug/dL — ABNORMAL LOW (ref 250–450)
UIBC: 193 ug/dL

## 2018-02-16 LAB — FERRITIN: Ferritin: 147 ng/mL (ref 11–307)

## 2018-02-16 MED ORDER — ENOXAPARIN SODIUM 40 MG/0.4ML ~~LOC~~ SOLN
40.0000 mg | SUBCUTANEOUS | Status: DC
  Filled 2018-02-16: qty 0.4

## 2018-02-16 NOTE — Progress Notes (Signed)
CC: Abdominal pain  Subjective: She is comfortable in bed.  Still fairly tender especially if you move her abdomen.  She thinks the swelling has gone down anterior abdomen but more swelling on the sides.  On exam there is no necrotic tissue.  Skin is intact.  Minimal if any erythema.  Objective: Vital signs in last 24 hours: Temp:  [98.5 F (36.9 C)-99.3 F (37.4 C)] 98.5 F (36.9 C) (01/20 0546) Pulse Rate:  [100-109] 100 (01/20 0546) Resp:  [18] 18 (01/20 0546) BP: (111-115)/(58-74) 115/74 (01/20 0546) SpO2:  [94 %-99 %] 94 % (01/20 0546) Last BM Date: 02/13/18 360 p.o. 830 IV Urine x2 No BM recorded. Intake/Output from previous day: 01/19 0701 - 01/20 0700 In: 1240.2 [P.O.:360; I.V.:830.2; IV Piggyback:50] Out: -  Intake/Output this shift: Total I/O In: 240 [P.O.:240] Out: -   General appearance: alert, cooperative and no distress Skin: Skin color, texture, turgor normal. No rashes or lesions or No acute skin changes abdominal wall in or around the sites of injection.  Lab Results:  Recent Labs    02/15/18 0514 02/16/18 0456  WBC 14.0* 9.9  HGB 9.8* 9.2*  HCT 28.9* 27.7*  PLT 264 259    BMET Recent Labs    02/14/18 0331 02/15/18 0514  NA 133* 136  K 3.3* 3.6  CL 103 106  CO2 22 21*  GLUCOSE 111* 101*  BUN 7 5*  CREATININE 0.69 0.70  CALCIUM 8.1* 7.8*   PT/INR No results for input(s): LABPROT, INR in the last 72 hours.  Recent Labs  Lab 02/11/18 0055 02/13/18 0953  AST 18 15  ALT 16 11  ALKPHOS 66 55  BILITOT 0.7 0.8  PROT 8.0 7.3  ALBUMIN 3.9 3.1*     Lipase     Component Value Date/Time   LIPASE 26 02/13/2018 0953     Medications: . citalopram  20 mg Oral Daily  . docusate sodium  100 mg Oral BID  . Melatonin  3 mg Oral q1800   . sodium chloride 100 mL/hr at 02/15/18 2329  . sodium chloride Stopped (02/14/18 1155)  .  ceFAZolin (ANCEF) IV 1 g (02/16/18 0537)   Anti-infectives (From admission, onward)   Start      Dose/Rate Route Frequency Ordered Stop   02/15/18 1400  ceFAZolin (ANCEF) IVPB 1 g/50 mL premix     1 g 100 mL/hr over 30 Minutes Intravenous Every 8 hours 02/15/18 1116     02/13/18 2330  vancomycin (VANCOCIN) IVPB 1000 mg/200 mL premix  Status:  Discontinued     1,000 mg 200 mL/hr over 60 Minutes Intravenous Every 12 hours 02/13/18 1331 02/15/18 1116   02/13/18 1630  piperacillin-tazobactam (ZOSYN) IVPB 3.375 g  Status:  Discontinued     3.375 g 12.5 mL/hr over 240 Minutes Intravenous Every 8 hours 02/13/18 1331 02/14/18 1325   02/13/18 1100  vancomycin (VANCOCIN) 2,000 mg in sodium chloride 0.9 % 500 mL IVPB     2,000 mg 250 mL/hr over 120 Minutes Intravenous  Once 02/13/18 1022 02/13/18 1321   02/13/18 1030  piperacillin-tazobactam (ZOSYN) IVPB 3.375 g     3.375 g 100 mL/hr over 30 Minutes Intravenous  Once 02/13/18 1022 02/13/18 1121      Assessment/Plan Hx very recurrent major depression Hx schizophrenia History of substance-induced psychotic disorder Asthma BMI 38.9   Reactive edema and stranding subcutaneous soft tissue of the abdominal wall. S/P injection of Tide liquid detergent -unknown volume/concentration into abdominal wall  FEN:IV fluids/regular diet ID: Zosyn 1/17- 02/15/27; vancomycin 02/13/18; Ancef 1/19 >> day 2 DVT: None: SCDs added Follow-up: TBD    Plan: Currently there are no surgical issues.  Skin is intact.  No areas of necrosis, skin sloughing, cellulitis noted on exam.  Please call if there is a change, or if we can be of further assistance.  LOS: 2 days    Renee [name] 02/16/2018 [phone]

## 2018-02-16 NOTE — Progress Notes (Addendum)
PROGRESS NOTE    Renee [name]  BBC:[id number] DOB: 02-26-1995 DOA: 02/13/2018 PCP: Howard Pouch, MD   Brief Narrative:  23 year old with past medical history relevant for anxiety/depression, mild intermittent asthma who presented after injecting tied into her abdomen in an attempt to lose weight and subsequently developed sepsis secondary to abdominal wall cellulitis/panniculitis.   Assessment & Plan:   Principal Problem:   Sepsis (HCC) Active Problems:   Asthma   Schizoaffective disorder (HCC)   Abdominal wall cellulitis   Fever   Tachycardia   Leukocytosis   Obesity (BMI 30-39.9)   Generalized abdominal wall pain   #) Sepsis due to abdominal wall cellulitis: At this time patient sepsis is resolved.  She is no longer febrile.  While on exam she does not have much erythema she does have fairly impressive tenderness and abdominal wall induration.  It is unclear exactly how much of an infectious process this is versus simply an inflammatory process. -Continue IV cefazolin started 02/15/2018 -Order procalcitonin -Blood cultures on 02/14/2018 no growth to date  #) Anxiety/depression: -Continue citalopram 20 mg daily -Continue PRN hydroxyzine for anxiety  #) Mild remittent asthma: -Continue PRN bronc bronchodilator  #) Anemia: Patient presented with mild anemia that is gradually worsening and stabilized around 9.  It is not microcytic.  Suspect most likely dilution although there might be a component of menorrhagia and chronic iron deficiency anemia due to blood loss contributing. -Iron studies ordered  Fluids: Tolerating p.o. Electrolytes: Monitor and supplement Nutrition: Regular diet   Prophylaxis: Enoxaparin  Disposition: Pending improvement in abdominal pain and erythema and induration next Full code    Consultants:   General surgery  Procedures:   None  Antimicrobials:  IV vancomycin started 02/15/2020 02/15/2018  IV cefazolin started  02/15/2018   Subjective: This morning patient reports that the pain is improving somewhat but she continues to have fairly severe pain that radiates throughout her abdomen.  She reports that is also increasingly tense and now spreading more towards her back.  She denies any fevers, nausea, vomiting, diarrhea.  Objective: Vitals:   02/15/18 0601 02/15/18 1326 02/15/18 2132 02/16/18 0546  BP: 105/79 (!) 111/58 111/62 115/74  Pulse: (!) 104 (!) 108 (!) 109 100  Resp: 18 18 18 18   Temp: 100.1 F (37.8 C) 99.3 F (37.4 C) 99 F (37.2 C) 98.5 F (36.9 C)  TempSrc: Oral Oral Oral Oral  SpO2: 97% 99% 96% 94%    Intake/Output Summary (Last 24 hours) at 02/16/2018 1240 Last data filed at 02/16/2018 5038 Gross per 24 hour  Intake 1120.18 ml  Output -  Net 1120.18 ml   There were no vitals filed for this visit.  Examination:  General exam: Appears calm and comfortable  Respiratory system: Clear to auscultation. Respiratory effort normal. Cardiovascular system: Regular rate and rhythm, no murmurs Gastrointestinal system: Mildly tender to abdominal wall palpation, no rebound or guarding, plus bowel sounds. Central nervous system: Alert and oriented.  Grossly intact, moving all extremities Extremities: No lower extremity edema Skin: Mild erythema and tenderness in bandlike area over right abdomen Psychiatry: Judgement and insight appear normal. Mood & affect appropriate.     Data Reviewed: I have personally reviewed following labs and imaging studies  CBC: Recent Labs  Lab 02/11/18 0055 02/13/18 0953 02/14/18 0331 02/15/18 0514 02/16/18 0456  WBC 12.0* 12.6* 14.5* 14.0* 9.9  NEUTROABS  --  9.9*  --   --   --   HGB 13.3 11.1* 10.4* 9.8* 9.2*  HCT 41.2 34.1* 32.4* 28.9* 27.7*  MCV 86.7 88.3 89.0 84.3 85.2  PLT 365 291 253 264 259   Basic Metabolic Panel: Recent Labs  Lab 02/11/18 0055 02/13/18 0953 02/14/18 0331 02/15/18 0514  NA 139 134* 133* 136  K 3.5 3.5 3.3* 3.6   CL 107 102 103 106  CO2 23 24 22  21*  GLUCOSE 85 100* 111* 101*  BUN 7 <5* 7 5*  CREATININE 0.68 0.74 0.69 0.70  CALCIUM 8.9 8.4* 8.1* 7.8*   GFR: Estimated Creatinine Clearance: 121.7 mL/min (by C-G formula based on SCr of 0.7 mg/dL). Liver Function Tests: Recent Labs  Lab 02/11/18 0055 02/13/18 0953  AST 18 15  ALT 16 11  ALKPHOS 66 55  BILITOT 0.7 0.8  PROT 8.0 7.3  ALBUMIN 3.9 3.1*   Recent Labs  Lab 02/11/18 0055 02/13/18 0953  LIPASE 47 26   No results for input(s): AMMONIA in the last 168 hours. Coagulation Profile: No results for input(s): INR, PROTIME in the last 168 hours. Cardiac Enzymes: No results for input(s): CKTOTAL, CKMB, CKMBINDEX, TROPONINI in the last 168 hours. BNP (last 3 results) No results for input(s): PROBNP in the last 8760 hours. HbA1C: No results for input(s): HGBA1C in the last 72 hours. CBG: No results for input(s): GLUCAP in the last 168 hours. Lipid Profile: No results for input(s): CHOL, HDL, LDLCALC, TRIG, CHOLHDL, LDLDIRECT in the last 72 hours. Thyroid Function Tests: No results for input(s): TSH, T4TOTAL, FREET4, T3FREE, THYROIDAB in the last 72 hours. Anemia Panel: No results for input(s): VITAMINB12, FOLATE, FERRITIN, TIBC, IRON, RETICCTPCT in the last 72 hours. Sepsis Labs: Recent Labs  Lab 02/13/18 1002  LATICACIDVEN 1.10    Recent Results (from the past 240 hour(s))  Culture, blood (routine x 2)     Status: None (Preliminary result)   Collection Time: 02/14/18  8:28 AM  Result Value Ref Range Status   Specimen Description   Final    BLOOD LEFT HAND Performed at [facility], [address] [place], [place] [address]    Special Requests   Final    BOTTLES DRAWN AEROBIC ONLY Blood Culture adequate volume Performed at [facility], [address], [place], [place] [address]    Culture   Final    NO GROWTH 2 DAYS Performed at [facility], [address],  [place], [place] [address]    Report Status PENDING  Incomplete  Culture, blood (routine x 2)     Status: None (Preliminary result)   Collection Time: 02/14/18  8:28 AM  Result Value Ref Range Status   Specimen Description   Final    BLOOD BLOOD LEFT FOREARM Performed at [facility], [address], [place], [place] [address]    Special Requests   Final    BOTTLES DRAWN AEROBIC ONLY Blood Culture adequate volume Performed at [facility], [address], [place], [place] [address]    Culture   Final    NO GROWTH 2 DAYS Performed at [facility], [address] [place], [place] [address]    Report Status PENDING  Incomplete  MRSA PCR Screening     Status: None   Collection Time: 02/14/18  2:53 PM  Result Value Ref Range Status   MRSA by PCR NEGATIVE NEGATIVE Final    Comment:        The GeneXpert MRSA Assay (FDA approved for NASAL specimens only), is one component  of a comprehensive MRSA colonization surveillance program. It is not intended to diagnose MRSA infection nor to guide or monitor treatment for MRSA infections. Performed at [facility], [address] [place], [place] [address]          Radiology Studies: No results found.      Scheduled Meds: . citalopram  20 mg Oral Daily  . docusate sodium  100 mg Oral BID  . Melatonin  3 mg Oral q1800   Continuous Infusions: . sodium chloride 100 mL/hr at 02/15/18 2329  . sodium chloride Stopped (02/14/18 1155)  .  ceFAZolin (ANCEF) IV 1 g (02/16/18 0537)     LOS: 2 days    Time spent: 35    Delaine LameShrey C Edna Grover, MD [facility]  If 7PM-7AM, please contact night-coverage [email or web address] Password [email or web address] 02/16/2018, 12:40 PM

## 2018-02-16 NOTE — Evaluation (Addendum)
Physical Therapy Evaluation Patient Details Name: Renee [name] MRN: [id number] DOB: 04/28/1995 Today's Date: 02/16/2018   History of Present Illness  Renee [name] is a 23 y.o. female with past medical history of schizophrenia, major recurrent depression, anxiety, asthma, morbid obesity presented to the hospital 02/14/18 with generalized abdominal pain and fever after self injecting laundry detergent into the abdomen. Returned  2 days later with worsening abdominal pain, radiation to back and thigh and decreased physical activity due to pain. Admitted with  sepsis , high fever (103F), tachycardia, and leukocytosis.  Clinical Impression  The patient is mobilizing well, guarding abdomen. Patient ambulated x 90' without UE support and supervision.  HR 143, Noted DOE. O2 Sats 95%. Patient should  progress to return home. Patient may be somewhat  Limited in negotiating steps to second level. Patient stated that her bed may be  moved downstairs. Pt admitted with above diagnosis. Pt currently with functional limitations due to the deficits listed below (see PT Problem List). Pt will benefit from skilled PT to increase their independence and safety with mobility to allow discharge to the venue listed below.     Follow Up Recommendations No PT follow up    Equipment Recommendations  None recommended by PT    Recommendations for Other Services       Precautions / Restrictions Precautions Precaution Comments: monitor HR      Mobility  Bed Mobility Overal bed mobility: Independent                Transfers Overall transfer level: Independent                  Ambulation/Gait Ambulation/Gait assistance: Supervision Gait Distance (Feet): 40 Feet(then 90') Assistive device: None Gait Pattern/deviations: Step-to pattern;Step-through pattern;Wide base of support Gait velocity: decr   General Gait Details: able to ambualte without UE support. HR 143, dyspnea  3-4/4  Stairs            Wheelchair Mobility    Modified Rankin (Stroke Patients Only)       Balance Overall balance assessment: No apparent balance deficits (not formally assessed)                                           Pertinent Vitals/Pain Pain Assessment: 0-10 Pain Score: 8  Pain Location: abdomen and right thigh Pain Intervention(s): Monitored during session;Premedicated before session    Home Living Family/patient expects to be discharged to:: Private residence Living Arrangements: Parent;Other relatives Available Help at Discharge: Family Type of Home: Apartment       Home Layout: Two level;1/2 bath on main level Home Equipment: None      Prior Function Level of Independence: Independent               Hand Dominance        Extremity/Trunk Assessment   Upper Extremity Assessment Upper Extremity Assessment: Overall WFL for tasks assessed    Lower Extremity Assessment Lower Extremity Assessment: Overall WFL for tasks assessed    Cervical / Trunk Assessment Cervical / Trunk Assessment: Normal  Communication   Communication: No difficulties  Cognition Arousal/Alertness: Awake/alert Behavior During Therapy: WFL for tasks assessed/performed Overall Cognitive Status: Within Functional Limits for tasks assessed  General Comments      Exercises     Assessment/Plan    PT Assessment Patient needs continued PT services  PT Problem List Decreased strength;Decreased activity tolerance;Decreased mobility;Pain       PT Treatment Interventions Gait training;Stair training    PT Goals (Current goals can be found in the Care Plan section)  Acute Rehab PT Goals Patient Stated Goal: agreed to ambulate PT Goal Formulation: With patient Time For Goal Achievement: 03/02/18 Potential to Achieve Goals: Good    Frequency Min 2X/week   Barriers to discharge         Co-evaluation               AM-PAC PT "6 Clicks" Mobility  Outcome Measure Help needed turning from your back to your side while in a flat bed without using bedrails?: None Help needed moving from lying on your back to sitting on the side of a flat bed without using bedrails?: None Help needed moving to and from a bed to a chair (including a wheelchair)?: None Help needed standing up from a chair using your arms (e.g., wheelchair or bedside chair)?: None Help needed to walk in hospital room?: A Little Help needed climbing 3-5 steps with a railing? : A Lot 6 Click Score: 21    End of Session   Activity Tolerance: Patient limited by fatigue Patient left: in bed;with call bell/phone within reach Nurse Communication: Mobility status PT Visit Diagnosis: Unsteadiness on feet (R26.81)    Time: [id number] PT Time Calculation (min) (ACUTE ONLY): 21 min   Charges:   PT Evaluation $PT Eval Low Complexity: 1 Low          Blanchard KelchKaren Devantae Babe PT Acute Rehabilitation Services Pager [phone] Office [phone]   Rada HayHill, Odell Choung Elizabeth 02/16/2018, 9:31 AM

## 2018-02-17 ENCOUNTER — Inpatient Hospital Stay (HOSPITAL_COMMUNITY): Payer: BLUE CROSS/BLUE SHIELD

## 2018-02-17 DIAGNOSIS — R7989 Other specified abnormal findings of blood chemistry: Secondary | ICD-10-CM

## 2018-02-17 LAB — CBC
HCT: 29.5 % — ABNORMAL LOW (ref 36.0–46.0)
Hemoglobin: 9.5 g/dL — ABNORMAL LOW (ref 12.0–15.0)
MCH: 28.8 pg (ref 26.0–34.0)
MCHC: 32.2 g/dL (ref 30.0–36.0)
MCV: 89.4 fL (ref 80.0–100.0)
Platelets: 353 10*3/uL (ref 150–400)
RBC: 3.3 MIL/uL — ABNORMAL LOW (ref 3.87–5.11)
RDW: 12.5 % (ref 11.5–15.5)
WBC: 9.1 10*3/uL (ref 4.0–10.5)
nRBC: 0 % (ref 0.0–0.2)

## 2018-02-17 LAB — D-DIMER, QUANTITATIVE: D-Dimer, Quant: 1.26 ug/mL-FEU — ABNORMAL HIGH (ref 0.00–0.50)

## 2018-02-17 LAB — PROCALCITONIN: Procalcitonin: <0.1 ng/mL

## 2018-02-17 MED ORDER — FERROUS SULFATE 325 (65 FE) MG PO TABS
325.0000 mg | ORAL_TABLET | Freq: Every day | ORAL | Status: DC
  Administered 2018-02-18: 325 mg via ORAL
  Filled 2018-02-17: qty 1

## 2018-02-17 MED ORDER — IOPAMIDOL (ISOVUE-370) INJECTION 76%
INTRAVENOUS | Status: AC
  Filled 2018-02-17: qty 100

## 2018-02-17 MED ORDER — SODIUM CHLORIDE (PF) 0.9 % IJ SOLN
INTRAMUSCULAR | Status: AC
  Filled 2018-02-17: qty 50

## 2018-02-17 MED ORDER — IOPAMIDOL (ISOVUE-370) INJECTION 76%
100.0000 mL | Freq: Once | INTRAVENOUS | Status: AC | PRN
  Administered 2018-02-17: 100 mL via INTRAVENOUS

## 2018-02-17 MED ORDER — CLINDAMYCIN PHOSPHATE 600 MG/50ML IV SOLN
600.0000 mg | Freq: Three times a day (TID) | INTRAVENOUS | Status: DC
  Administered 2018-02-17 – 2018-02-18 (×3): 600 mg via INTRAVENOUS
  Filled 2018-02-17 (×3): qty 50

## 2018-02-17 NOTE — Progress Notes (Signed)
Bilateral lower extremities venous duplex exam completed. More details please see preliminary notes on CV PROC under chart review. Renee [name] H Desiree Daise(RDMS RVT) 02/17/18 4:02 PM

## 2018-02-17 NOTE — Progress Notes (Signed)
PROGRESS NOTE    Renee [name]  ZMC:[id number] DOB: 1995/05/22 DOA: 02/13/2018 PCP: Howard Pouch, MD   Brief Narrative:  23 year old with past medical history relevant for anxiety/depression, mild intermittent asthma who presented after injecting tied into her abdomen in an attempt to lose weight and subsequently developed sepsis secondary to abdominal wall cellulitis/panniculitis.   Assessment & Plan:   Principal Problem:   Sepsis (HCC) Active Problems:   Asthma   Schizoaffective disorder (HCC)   Abdominal wall cellulitis   Fever   Tachycardia   Leukocytosis   Obesity (BMI 30-39.9)   Generalized abdominal wall pain   #) Sepsis due to abdominal wall cellulitis: At this time it is not clear how much of these episodes of fevers are related to a true infection versus inflammation.  She does now have some tenderness in fevers that are still low-grade.  Of note her procalcitonin is quite low. -Continue IV cefazolin started 02/15/2018 2 02/17/2018 -Start IV clindamycin -Blood cultures on 02/14/2018 no growth to date  #) Anxiety/depression: -Continue citalopram 20 mg daily -Continue PRN hydroxyzine for anxiety  #) Mild remittent asthma: Chest x-ray pending -Continue PRN bronc bronchodilator  #) Iron deficiency anemia: Patient developed some anemia during this hospitalization.  Iron studies were low. -Start iron 325 daily -Iron studies ordered  Fluids: Tolerating p.o. Electrolytes: Monitor and supplement Nutrition: Regular diet   Prophylaxis: Enoxaparin  Disposition: Pending resolution of fevers  Full code    Consultants:   General surgery  Procedures:   None  Antimicrobials:  IV vancomycin started 02/15/2020 02/15/2018  IV cefazolin started 02/15/2018 2 02/17/2018  IV clindamycin started 02/17/2018   Subjective: This morning patient reports that the the induration is now no longer the site of injection but has migrated to the right side where she has some  "bogginess".  She also reports a fever and was noted to be febrile to 100.7.  She is mildly also short of breath and tachypneic.  She denies any fevers, nausea, vomiting, diarrhea.  Objective: Vitals:   02/16/18 0546 02/16/18 1358 02/16/18 2135 02/17/18 0612  BP: 115/74 (!) 154/94 113/70 (!) 91/44  Pulse: 100 92 (!) 122 (!) 123  Resp: 18 16 17 17   Temp: 98.5 F (36.9 C) 98.9 F (37.2 C) 99.7 F (37.6 C) (!) 100.7 F (38.2 C)  TempSrc: Oral Oral Oral Oral  SpO2: 94% 98% 95% 97%    Intake/Output Summary (Last 24 hours) at 02/17/2018 1024 Last data filed at 02/17/2018 0957 Gross per 24 hour  Intake 1286.81 ml  Output -  Net 1286.81 ml   There were no vitals filed for this visit.  Examination:  General exam: Appears calm and comfortable  Respiratory system: Mildly increased work of breathing, diminished lung sounds throughout Cardiovascular system: Regular rate and rhythm, no murmurs Gastrointestinal system: Mildly tender to abdominal wall palpation over right side induration noted, no rebound or guarding, plus bowel sounds. Central nervous system: Alert and oriented.  Grossly intact, moving all extremities Extremities: No lower extremity edema Skin: Mild erythema and tenderness in bandlike area over right abdomen Psychiatry: Judgement and insight appear normal. Mood & affect appropriate.     Data Reviewed: I have personally reviewed following labs and imaging studies  CBC: Recent Labs  Lab 02/13/18 0953 02/14/18 0331 02/15/18 0514 02/16/18 0456 02/17/18 0342  WBC 12.6* 14.5* 14.0* 9.9 9.1  NEUTROABS 9.9*  --   --   --   --   HGB 11.1* 10.4* 9.8* 9.2* 9.5*  HCT 34.1* 32.4* 28.9* 27.7* 29.5*  MCV 88.3 89.0 84.3 85.2 89.4  PLT 291 253 264 259 353   Basic Metabolic Panel: Recent Labs  Lab 02/11/18 0055 02/13/18 0953 02/14/18 0331 02/15/18 0514  NA 139 134* 133* 136  K 3.5 3.5 3.3* 3.6  CL 107 102 103 106  CO2 23 24 22  21*  GLUCOSE 85 100* 111* 101*  BUN 7  <5* 7 5*  CREATININE 0.68 0.74 0.69 0.70  CALCIUM 8.9 8.4* 8.1* 7.8*   GFR: Estimated Creatinine Clearance: 121.7 mL/min (by C-G formula based on SCr of 0.7 mg/dL). Liver Function Tests: Recent Labs  Lab 02/11/18 0055 02/13/18 0953  AST 18 15  ALT 16 11  ALKPHOS 66 55  BILITOT 0.7 0.8  PROT 8.0 7.3  ALBUMIN 3.9 3.1*   Recent Labs  Lab 02/11/18 0055 02/13/18 0953  LIPASE 47 26   No results for input(s): AMMONIA in the last 168 hours. Coagulation Profile: No results for input(s): INR, PROTIME in the last 168 hours. Cardiac Enzymes: No results for input(s): CKTOTAL, CKMB, CKMBINDEX, TROPONINI in the last 168 hours. BNP (last 3 results) No results for input(s): PROBNP in the last 8760 hours. HbA1C: No results for input(s): HGBA1C in the last 72 hours. CBG: No results for input(s): GLUCAP in the last 168 hours. Lipid Profile: No results for input(s): CHOL, HDL, LDLCALC, TRIG, CHOLHDL, LDLDIRECT in the last 72 hours. Thyroid Function Tests: No results for input(s): TSH, T4TOTAL, FREET4, T3FREE, THYROIDAB in the last 72 hours. Anemia Panel: Recent Labs    02/16/18 1327  FERRITIN 147  TIBC 213*  IRON 20*   Sepsis Labs: Recent Labs  Lab 02/13/18 1002 02/16/18 1232 02/17/18 0342  PROCALCITON  --  <0.10 <0.10  LATICACIDVEN 1.10  --   --     Recent Results (from the past 240 hour(s))  Culture, blood (routine x 2)     Status: None (Preliminary result)   Collection Time: 02/14/18  8:28 AM  Result Value Ref Range Status   Specimen Description   Final    BLOOD LEFT HAND Performed at [facility], [address] [place], [place] [address]    Special Requests   Final    BOTTLES DRAWN AEROBIC ONLY Blood Culture adequate volume Performed at [facility], [address] [place], [place] [address]    Culture   Final    NO GROWTH 2 DAYS Performed at [facility], [address], [place], [place] [address]    Report Status  PENDING  Incomplete  Culture, blood (routine x 2)     Status: None (Preliminary result)   Collection Time: 02/14/18  8:28 AM  Result Value Ref Range Status   Specimen Description   Final    BLOOD BLOOD LEFT FOREARM Performed at [facility], [address], [place], [place] [address]    Special Requests   Final    BOTTLES DRAWN AEROBIC ONLY Blood Culture adequate volume Performed at [facility], [address], , [place] [address]    Culture   Final    NO GROWTH 2 DAYS Performed at [facility], [address], [place], [place] [address]    Report Status PENDING  Incomplete  MRSA PCR Screening     Status: None   Collection Time: 02/14/18  2:53 PM  Result Value Ref Range Status   MRSA by PCR NEGATIVE NEGATIVE Final    Comment:  The GeneXpert MRSA Assay (FDA approved for NASAL specimens only), is one component of a comprehensive MRSA colonization surveillance program. It is not intended to diagnose MRSA infection nor to guide or monitor treatment for MRSA infections. Performed at [facility], [address] [place], [place] [address]          Radiology Studies: No results found.      Scheduled Meds: . citalopram  20 mg Oral Daily  . docusate sodium  100 mg Oral BID  . enoxaparin (LOVENOX) injection  40 mg Subcutaneous Q24H  . [START ON 02/18/2018] ferrous sulfate  325 mg Oral Q breakfast  . Melatonin  3 mg Oral q1800   Continuous Infusions: . sodium chloride Stopped (02/14/18 1155)  .  ceFAZolin (ANCEF) IV 1 g (02/17/18 0531)     LOS: 3 days    Time spent: 35    Delaine LameShrey C Raysean Graumann, MD [facility]  If 7PM-7AM, please contact night-coverage [email or web address] Password [email or web address] 02/17/2018, 10:24 AM

## 2018-02-17 NOTE — Progress Notes (Signed)
Although abdomen is softer today; there is a consolidation on the patient's Right flank; tiny(less than 77mm) white nodules are starting to appear; & the area is very hard.

## 2018-02-18 ENCOUNTER — Ambulatory Visit: Payer: BLUE CROSS/BLUE SHIELD | Admitting: Family Medicine

## 2018-02-18 LAB — BASIC METABOLIC PANEL
BUN: <5 mg/dL — ABNORMAL LOW (ref 6–20)
CO2: 23 mmol/L (ref 22–32)
Calcium: 7.9 mg/dL — ABNORMAL LOW (ref 8.9–10.3)
Chloride: 105 mmol/L (ref 98–111)
Creatinine, Ser: 0.63 mg/dL (ref 0.44–1.00)
GFR calc Af Amer: >60 mL/min (ref >60)
GFR calc non Af Amer: >60 mL/min (ref >60)
Glucose, Bld: 93 mg/dL (ref 70–99)
Potassium: 3.3 mmol/L — ABNORMAL LOW (ref 3.5–5.1)
Sodium: 136 mmol/L (ref 135–145)

## 2018-02-18 LAB — BASIC METABOLIC PANEL WITH GFR: Anion gap: 8 (ref 5–15)

## 2018-02-18 LAB — CBC
HCT: 29.4 % — ABNORMAL LOW (ref 36.0–46.0)
Hemoglobin: 9.5 g/dL — ABNORMAL LOW (ref 12.0–15.0)
MCH: 28.4 pg (ref 26.0–34.0)
MCHC: 32.3 g/dL (ref 30.0–36.0)
MCV: 87.8 fL (ref 80.0–100.0)
Platelets: 382 10*3/uL (ref 150–400)
RBC: 3.35 MIL/uL — ABNORMAL LOW (ref 3.87–5.11)
RDW: 12.5 % (ref 11.5–15.5)
WBC: 9.7 10*3/uL (ref 4.0–10.5)
nRBC: 0 % (ref 0.0–0.2)

## 2018-02-18 LAB — PROCALCITONIN: Procalcitonin: 0.11 ng/mL

## 2018-02-18 MED ORDER — HYDROCODONE-ACETAMINOPHEN 5-325 MG PO TABS: 1.0000 | ORAL_TABLET | ORAL | 0 refills | Status: AC | PRN

## 2018-02-18 MED ORDER — ALBUTEROL SULFATE HFA 108 (90 BASE) MCG/ACT IN AERS: 2.0000 | INHALATION_SPRAY | Freq: Four times a day (QID) | RESPIRATORY_TRACT | 3 refills | Status: DC

## 2018-02-18 MED ORDER — FERROUS SULFATE 325 (65 FE) MG PO TABS: 325.0000 mg | ORAL_TABLET | Freq: Every day | ORAL | 3 refills | Status: DC

## 2018-02-18 MED ORDER — AMOXICILLIN-POT CLAVULANATE 875-125 MG PO TABS: 1.0000 | ORAL_TABLET | Freq: Two times a day (BID) | ORAL | 0 refills | Status: AC

## 2018-02-18 MED ORDER — PREDNISONE 20 MG PO TABS: 40.0000 mg | ORAL_TABLET | Freq: Every day | ORAL | 0 refills | Status: AC

## 2018-02-18 NOTE — Discharge Summary (Addendum)
Physician Discharge Summary  Renee [name]:[id number] DOB: 11-10-1995 DOA: 02/13/2018  PCP: Howard Pouch, MD  Admit date: 02/13/2018 Discharge date: 02/18/2018  Admitted From: Home Disposition:  Home  Recommendations for Outpatient Follow-up:  1. Follow up with PCP in 1-2 weeks 2. Please obtain BMP/CBC in one week   Home Health: No Equipment/Devices:none  Discharge Condition: stable CODE STATUS: FULL Diet recommendation: Regular   Brief/Interim Summary:  #) Sepsis due to abdominal wall inflammation: Patient was admitted with abdominal wall inflammation after injecting tied into her abdomen to try to lose weight.  Patient was noted to have a fever.  CT scan showed no evidence of abscess but did show diffuse cellulitis or panniculitis.  Patient initially was on IV vancomycin but this was transitioned to IV cefazolin when MRSA swab was negative.  Patient continues to have low-grade temperatures to 100.7 and she was transitioned to IV clindamycin.  On discharge patient was discharged on p.o. Augmentin to cover both sepsis due to abdominal wall inflammation and possible pneumonia.  Blood cultures were no growth to date on 02/14/2018.  #) Possible pneumonia/mild intermittent asthma: During her hospitalization patient began to report episodes of cough and shortness of breath.  Chest x-ray was unremarkable.  D-dimer was elevated.  CTA showed no evidence of pulmonary embolism but did show possible evidence of pneumonia as well as small pleural effusions.  Patient was given oral antibiotics.  Patient was also noted to be wheezing and was given bronchodilators and discharged home on oral steroids for 5 days.)  Anxiety/depression: Patient was continued on home citalopram and PRN hydroxyzine.  #) Iron deficiency anemia: Patient was noted to have low iron stores and was discharged home on oral iron.  Discharge Diagnoses:  Principal Problem:   Sepsis (HCC) Active Problems:   Asthma    Schizoaffective disorder (HCC)   Abdominal wall cellulitis   Fever   Tachycardia   Leukocytosis   Obesity (BMI 30-39.9)   Generalized abdominal wall pain    Discharge Instructions  Discharge Instructions    Call MD for:  difficulty breathing, headache or visual disturbances   Complete by:  As directed    Call MD for:  hives   Complete by:  As directed    Call MD for:  persistant dizziness or light-headedness   Complete by:  As directed    Call MD for:  persistant nausea and vomiting   Complete by:  As directed    Call MD for:  redness, tenderness, or signs of infection (pain, swelling, redness, odor or green/yellow discharge around incision site)   Complete by:  As directed    Call MD for:  severe uncontrolled pain   Complete by:  As directed    Call MD for:  temperature >100.4   Complete by:  As directed    Diet - low sodium heart healthy   Complete by:  As directed    Discharge instructions   Complete by:  As directed    Please follow-up with your primary care doctor in 1 week.  Please take antibiotics and steroids as prescribed.   Increase activity slowly   Complete by:  As directed      Allergies as of 02/18/2018   No Known Allergies     Medication List    STOP taking these medications   cephALEXin 500 MG capsule Commonly known as:  KEFLEX     TAKE these medications   albuterol 108 (90 Base) MCG/ACT inhaler Commonly known as:  PROAIR HFA Inhale 2 puffs into the lungs every 6 (six) hours. For shortness of breath   amoxicillin-clavulanate 875-125 MG tablet Commonly known as:  AUGMENTIN Take 1 tablet by mouth 2 (two) times daily for 5 days.   citalopram 20 MG tablet Commonly known as:  CELEXA Take 20 mg by mouth daily.   ferrous sulfate 325 (65 FE) MG tablet Take 1 tablet (325 mg total) by mouth daily with breakfast. Start taking on:  February 19, 2018   HYDROcodone-acetaminophen 5-325 MG tablet Commonly known as:  NORCO/VICODIN Take 1-2 tablets by  mouth every 4 (four) hours as needed for up to 5 days for moderate pain.   hydrOXYzine 50 MG capsule Commonly known as:  VISTARIL Take 50 mg by mouth at bedtime.   hydrOXYzine 50 MG tablet Commonly known as:  ATARAX/VISTARIL Take 1 tablet (50 mg) by mouth four times daily as needed: For anxiety/sleep   ibuprofen 200 MG tablet Commonly known as:  ADVIL,MOTRIN Take 600 mg by mouth every 6 (six) hours as needed for headache or mild pain.   Melatonin 3 MG Tabs Take 1 tablet (3 mg total) by mouth daily at 6 PM. For sleep   ondansetron 4 MG tablet Commonly known as:  ZOFRAN Take 1 tablet (4 mg total) by mouth every 8 (eight) hours as needed for nausea or vomiting.   predniSONE 20 MG tablet Commonly known as:  DELTASONE Take 2 tablets (40 mg total) by mouth daily for 5 days.       No Known Allergies  Consultations:  None   Procedures/Studies: Ct Angio Chest Pe W Or Wo Contrast  Result Date: 02/17/2018 CLINICAL DATA:  Shortness of breath for the past 2 days. Currently admitted for sepsis secondary to abdominal wall cellulitis. EXAM: CT ANGIOGRAPHY CHEST WITH CONTRAST TECHNIQUE: Multidetector CT imaging of the chest was performed using the standard protocol during bolus administration of intravenous contrast. Multiplanar CT image reconstructions and MIPs were obtained to evaluate the vascular anatomy. CONTRAST:  100mL ISOVUE-370 IOPAMIDOL (ISOVUE-370) INJECTION 76% COMPARISON:  Chest x-ray from same day. FINDINGS: Cardiovascular: Satisfactory opacification of the pulmonary arteries to the lobar level. The segmental pulmonary arteries are not well evaluated due to patient's body habitus. No evidence of central or lobar pulmonary embolism. The heart is at the upper limits of normal in size. No pericardial effusion. No thoracic aortic aneurysm or dissection. Mediastinum/Nodes: Enlarged left axillary lymph nodes measuring up to 14 mm in short axis. Prominent subcentimeter right axillary  lymph nodes, not enlarged by CT size criteria. No enlarged mediastinal or hilar lymph nodes. The thyroid gland, trachea, and esophagus demonstrate no significant findings. Lungs/Pleura: New small bilateral pleural effusions. New patchy ground-glass densities in the left lower lobe. Minimal subsegmental atelectasis in the right lower lobe. No pneumothorax. No suspicious pulmonary nodule. Upper Abdomen: No acute abnormality. Musculoskeletal: No chest wall abnormality. No acute or significant osseous findings. Review of the MIP images confirms the above findings. IMPRESSION: 1. No central or lobar pulmonary embolism. Segmental pulmonary emboli are not well evaluated. 2. New patchy ground-glass densities in the left lower lobe, suspicious for infection or aspiration. 3. New small bilateral pleural effusions. 4. Enlarged left axillary lymph nodes measuring up to 14 mm. While these may be reactive, correlation with breast exam and diagnostic mammography is recommended. Electronically Signed   By: Obie DredgeWilliam T Derry M.D.   On: 02/17/2018 15:32   Ct Abdomen Pelvis W Contrast  Result Date: 02/13/2018 CLINICAL DATA:  Abdominal pain, nausea,  vomiting EXAM: CT ABDOMEN AND PELVIS WITH CONTRAST TECHNIQUE: Multidetector CT imaging of the abdomen and pelvis was performed using the standard protocol following bolus administration of intravenous contrast. CONTRAST:  ISOVUE-300 IOPAMIDOL (ISOVUE-300) INJECTION 61% COMPARISON:  02/11/2018 FINDINGS: Lower chest: Lung bases are clear. No effusions. Heart is normal size. Hepatobiliary: No focal hepatic abnormality. Gallbladder unremarkable. Pancreas: No focal abnormality or ductal dilatation. Spleen: No focal abnormality.  Normal size. Adrenals/Urinary Tract: Small cyst in the lower pole of the left kidney measures 1.8 cm. No hydronephrosis. Adrenal glands and urinary bladder unremarkable. Stomach/Bowel: Stomach, large and small bowel grossly unremarkable. Appendix normal  Vascular/Lymphatic: No evidence of aneurysm or adenopathy. Reproductive: 4.3 cm right ovarian cyst, likely functional cyst. Uterus and left ovary unremarkable. Other: No free fluid or free air. Extensive stranding/edema within the subcutaneous soft tissues of the abdominal wall. Previously seen locules of gas no longer visualized, but the edema/stranding is stable. This could reflect cellulitis or panniculitis. Musculoskeletal: No acute bony abnormality. IMPRESSION: Edema/stranding throughout the subcutaneous soft tissues of the abdominal wall a are similar prior study and could reflect cellulitis or panniculitis. Normal appendix. 4.1 cm right ovarian cyst. Electronically Signed   By: Charlett Nose M.D.   On: 02/13/2018 10:54   Ct Abdomen Pelvis W Contrast  Result Date: 02/11/2018 CLINICAL DATA:  Right-sided abdominal pain after injecting soap into her abdomen. EXAM: CT ABDOMEN AND PELVIS WITH CONTRAST TECHNIQUE: Multidetector CT imaging of the abdomen and pelvis was performed using the standard protocol following bolus administration of intravenous contrast. CONTRAST:  ISOVUE-300 IOPAMIDOL (ISOVUE-300) INJECTION 61% COMPARISON:  None. FINDINGS: Lower chest: Lung bases are clear. Heart size normal. No pericardial or pleural effusion. Distal esophagus is grossly unremarkable. Hepatobiliary: Liver and gallbladder are unremarkable. No biliary ductal dilatation. Pancreas: Negative. Spleen: Negative. Adrenals/Urinary Tract: Adrenal glands and right kidney are unremarkable. Somewhat ill-defined low-attenuation lesion in the lower pole left kidney measures 1.4 cm and is difficult to further characterize due to size. Ureters are decompressed. Bladder is unremarkable. Stomach/Bowel: Stomach, small bowel, appendix and colon are unremarkable. Vascular/Lymphatic: Circumaortic left renal vein. Vascular structures are otherwise unremarkable. No pathologically enlarged lymph nodes. Reproductive: Uterus is visualized. 3.1  cm low-attenuation lesion in the right ovary is likely a benign physiologic cyst. Other: Small pelvic free fluid, likely physiologic. Mesenteries and peritoneum are unremarkable. Musculoskeletal: Fairly diffuse ventral abdominal and right pelvic wall edema with locules of subcutaneous air. No organized fluid collection. Osseous structures are unremarkable. IMPRESSION: Fairly diffuse ventral abdominal and right pelvic wall edema with locules of subcutaneous air, findings consistent with the provided history of subcutaneous injections. No organized fluid collection. No intra-abdominal acute findings. Electronically Signed   By: Leanna Battles M.D.   On: 02/11/2018 09:33   Dg Chest Port 1 View  Result Date: 02/17/2018 CLINICAL DATA:  Shortness of Breath EXAM: PORTABLE CHEST 1 VIEW COMPARISON:  02/11/2018 FINDINGS: The heart size and mediastinal contours are within normal limits. Both lungs are clear. The visualized skeletal structures are unremarkable. IMPRESSION: No active disease. Electronically Signed   By: Alcide Clever M.D.   On: 02/17/2018 10:44   Dg Abdomen Acute W/chest  Result Date: 02/11/2018 CLINICAL DATA:  Abdominal pain after patient injected laundry detergent into abdomen EXAM: DG ABDOMEN ACUTE W/ 1V CHEST COMPARISON:  Abdomen series December 31, 2005; chest radiograph May 06, 2010 FINDINGS: PA chest: The lungs are clear. The heart size and pulmonary vascularity are normal. No adenopathy. Supine and upright abdomen: There is moderate  stool in the colon. There is no evident bowel dilatation or air-fluid level to suggest bowel obstruction. No free air. Lung bases clear. No radiopaque foreign body. IMPRESSION: No bowel obstruction or free air evident. Moderate stool in colon. No lung edema or consolidation. Electronically Signed   By: Bretta BangWilliam  Woodruff III M.D.   On: 02/11/2018 08:21   Vas [place] Lower Extremity Venous (dvt)  Result Date: 02/18/2018  Lower Venous Study Indications: Swelling, and  elevated D-dimer.  Risk Factors: Obesity. Limitations: Body habitus. Comparison Study: No comparison study available Performing Technologist: Melodie BouillonSelina Cole  Examination Guidelines: A complete evaluation includes B-mode imaging, spectral Doppler, color Doppler, and power Doppler as needed of all accessible portions of each vessel. Bilateral testing is considered an integral part of a complete examination. Limited examinations for reoccurring indications may be performed as noted.  Right Venous Findings: +---------+---------------+---------+-----------+----------+-------+          CompressibilityPhasicitySpontaneityPropertiesSummary +---------+---------------+---------+-----------+----------+-------+ CFV      Full           Yes      Yes                          +---------+---------------+---------+-----------+----------+-------+ SFJ      Full                                                 +---------+---------------+---------+-----------+----------+-------+ FV Prox  Full                                                 +---------+---------------+---------+-----------+----------+-------+ FV Mid   Full                                                 +---------+---------------+---------+-----------+----------+-------+ FV DistalFull                                                 +---------+---------------+---------+-----------+----------+-------+ PFV      Full                                                 +---------+---------------+---------+-----------+----------+-------+ POP      Full           Yes      Yes                          +---------+---------------+---------+-----------+----------+-------+ PTV      Full                                                 +---------+---------------+---------+-----------+----------+-------+ PERO     Full                                                  +---------+---------------+---------+-----------+----------+-------+  Left Venous Findings: +---------+---------------+---------+-----------+----------+-------+          CompressibilityPhasicitySpontaneityPropertiesSummary +---------+---------------+---------+-----------+----------+-------+ CFV      Full           Yes      Yes                          +---------+---------------+---------+-----------+----------+-------+ SFJ      Full                                                 +---------+---------------+---------+-----------+----------+-------+ FV Prox  Full                                                 +---------+---------------+---------+-----------+----------+-------+ FV Mid   Full                                                 +---------+---------------+---------+-----------+----------+-------+ FV DistalFull                                                 +---------+---------------+---------+-----------+----------+-------+ PFV      Full                                                 +---------+---------------+---------+-----------+----------+-------+ POP      Full           Yes      Yes                          +---------+---------------+---------+-----------+----------+-------+ PTV      Full                                                 +---------+---------------+---------+-----------+----------+-------+ PERO     Full                                                 +---------+---------------+---------+-----------+----------+-------+    Summary: Right: There is no evidence of deep vein thrombosis in the lower extremity. No cystic structure found in the popliteal fossa. Study limited due to patient body habitus. Left: There is no evidence of deep vein thrombosis in the lower extremity. No cystic structure found in the popliteal fossa. Study limited due to patient body habitus.  *See table(s) above for measurements and observations.  Electronically signed by Waverly Ferrari MD on 02/18/2018 at 5:43:57 AM.    Final     Subjective:   Discharge Exam: Vitals:   02/18/18 0205 02/18/18 0459  BP:  118/78  Pulse: (!) 108 100  Resp:  20  Temp:  98.7 F (37.1 C)  SpO2: 94% 96%   Vitals:   02/17/18 2140 02/18/18 0018 02/18/18 0205 02/18/18 0459  BP: (!) 150/83   118/78  Pulse: (!) 117  (!) 108 100  Resp: 20   20  Temp: 98.9 F (37.2 C) 100.1 F (37.8 C)  98.7 F (37.1 C)  TempSrc: Oral Oral  Oral  SpO2: 95%  94% 96%     General exam: Appears calm and comfortable  Respiratory system:  No increased work of breathing, diminished lung sounds throughout Cardiovascular system: Regular rate and rhythm, no murmurs Gastrointestinal system: Mildly tender to abdominal wall palpation over right side induration noted, no rebound or guarding, plus bowel sounds. Central nervous system: Alert and oriented.  Grossly intact, moving all extremities Extremities: No lower extremity edema Skin: Mild erythema and tenderness in bandlike area over right abdomen Psychiatry: Judgement and insight appear normal. Mood & affect appropriate.     The results of significant diagnostics from this hospitalization (including imaging, microbiology, ancillary and laboratory) are listed below for reference.     Microbiology: Recent Results (from the past 240 hour(s))  Culture, blood (routine x 2)     Status: None (Preliminary result)   Collection Time: 02/14/18  8:28 AM  Result Value Ref Range Status   Specimen Description   Final    BLOOD LEFT HAND Performed at [facility], [address], [place], [place] [address]    Special Requests   Final    BOTTLES DRAWN AEROBIC ONLY Blood Culture adequate volume Performed at [facility], [address] [place], [place] [address]    Culture   Final    NO GROWTH 4 DAYS Performed at [facility], [address] [place], [place] [address]     Report Status PENDING  Incomplete  Culture, blood (routine x 2)     Status: None (Preliminary result)   Collection Time: 02/14/18  8:28 AM  Result Value Ref Range Status   Specimen Description   Final    BLOOD BLOOD LEFT FOREARM Performed at [facility], [address] [place], [place] [address]    Special Requests   Final    BOTTLES DRAWN AEROBIC ONLY Blood Culture adequate volume Performed at [facility], [address], [place], [place] [address]    Culture   Final    NO GROWTH 4 DAYS Performed at [facility], [address], [place], [place] [address]    Report Status PENDING  Incomplete  MRSA PCR Screening     Status: None   Collection Time: 02/14/18  2:53 PM  Result Value Ref Range Status   MRSA by PCR NEGATIVE NEGATIVE Final    Comment:        The GeneXpert MRSA Assay (FDA approved for NASAL specimens only), is one component of a comprehensive MRSA colonization surveillance program. It is not intended to diagnose MRSA infection nor to guide or monitor treatment for MRSA infections. Performed at [facility], [address], [place], [place] [address]      Labs: BNP (last 3 results) No results for input(s): BNP in the last 8760 hours. Basic Metabolic Panel: Recent Labs  Lab 02/13/18 0953 02/14/18 0331 02/15/18 0514 02/18/18 0308  NA 134* 133* 136 136  K 3.5 3.3* 3.6 3.3*  CL 102 103 106 105  CO2 24 22 21* 23  GLUCOSE 100* 111* 101* 93  BUN <5* 7 5* <5*  CREATININE 0.74 0.69 0.70 0.63  CALCIUM 8.4* 8.1* 7.8* 7.9*   Liver Function Tests: Recent Labs  Lab 02/13/18 0953  AST 15  ALT 11  ALKPHOS 55  BILITOT 0.8  PROT 7.3  ALBUMIN 3.1*   Recent Labs  Lab 02/13/18 0953  LIPASE 26   No results for input(s): AMMONIA in the last 168 hours. CBC: Recent Labs  Lab 02/13/18 0953 02/14/18 0331 02/15/18 0514 02/16/18 0456 02/17/18 0342 02/18/18 0308  WBC 12.6* 14.5* 14.0* 9.9 9.1 9.7   NEUTROABS 9.9*  --   --   --   --   --   HGB 11.1* 10.4* 9.8* 9.2* 9.5* 9.5*  HCT 34.1* 32.4* 28.9* 27.7* 29.5* 29.4*  MCV 88.3 89.0 84.3 85.2 89.4 87.8  PLT 291 253 264 259 353 382   Cardiac Enzymes: No results for input(s): CKTOTAL, CKMB, CKMBINDEX, TROPONINI in the last 168 hours. BNP: Invalid input(s): POCBNP CBG: No results for input(s): GLUCAP in the last 168 hours. D-Dimer Recent Labs    02/17/18 1025  DDIMER 1.26*   Hgb A1c No results for input(s): HGBA1C in the last 72 hours. Lipid Profile No results for input(s): CHOL, HDL, LDLCALC, TRIG, CHOLHDL, LDLDIRECT in the last 72 hours. Thyroid function studies No results for input(s): TSH, T4TOTAL, T3FREE, THYROIDAB in the last 72 hours.  Invalid input(s): FREET3 Anemia work up Recent Labs    02/16/18 1327  FERRITIN 147  TIBC 213*  IRON 20*   Urinalysis    Component Value Date/Time   COLORURINE YELLOW 02/13/2018 1801   APPEARANCEUR CLEAR 02/13/2018 1801   LABSPEC 1.033 (H) 02/13/2018 1801   PHURINE 6.0 02/13/2018 1801   GLUCOSEU NEGATIVE 02/13/2018 1801   HGBUR SMALL (A) 02/13/2018 1801   BILIRUBINUR NEGATIVE 02/13/2018 1801   KETONESUR NEGATIVE 02/13/2018 1801   PROTEINUR NEGATIVE 02/13/2018 1801   NITRITE NEGATIVE 02/13/2018 1801   LEUKOCYTESUR NEGATIVE 02/13/2018 1801   Sepsis Labs Invalid input(s): PROCALCITONIN,  WBC,  LACTICIDVEN Microbiology Recent Results (from the past 240 hour(s))  Culture, blood (routine x 2)     Status: None (Preliminary result)   Collection Time: 02/14/18  8:28 AM  Result Value Ref Range Status   Specimen Description   Final    BLOOD LEFT HAND Performed at [facility], [address], [place], [place] [address]    Special Requests   Final    BOTTLES DRAWN AEROBIC ONLY Blood Culture adequate volume Performed at [facility], [address] [place], [place] [address]    Culture   Final    NO GROWTH 4 DAYS Performed at [facility], [address], [place], [place] [address]    Report Status PENDING  Incomplete  Culture, blood (routine x 2)     Status: None (Preliminary result)   Collection Time: 02/14/18  8:28 AM  Result Value Ref Range Status   Specimen Description   Final    BLOOD BLOOD LEFT FOREARM Performed at [facility], [address] [place], [place] [address]    Special Requests   Final    BOTTLES DRAWN AEROBIC ONLY Blood Culture adequate volume Performed at [facility], [address] [place], [place] [address]    Culture   Final    NO GROWTH 4 DAYS Performed at [facility], [address], [place], [place] [address]    Report  Status PENDING  Incomplete  MRSA PCR Screening     Status: None   Collection Time: 02/14/18  2:53 PM  Result Value Ref Range Status   MRSA by PCR NEGATIVE NEGATIVE Final    Comment:        The GeneXpert MRSA Assay (FDA approved for NASAL specimens only), is one component of a comprehensive MRSA colonization surveillance program. It is not intended to diagnose MRSA infection nor to guide or monitor treatment for MRSA infections. Performed at [facility], [address], [place], [place] [address]      Time coordinating discharge: 30  SIGNED:   Delaine Lame, MD  [facility] 02/18/2018, 11:07 AM  If 7PM-7AM, please contact night-coverage [email or web address] Password [email or web address]

## 2018-02-18 NOTE — Progress Notes (Signed)
PT Cancellation Note  Patient Details Name: Renee [name] MRN: [id number] DOB: August 13, 1995   Cancelled Treatment:    Reason Eval/Treat Not Completed: Patient declined, reports that she will stay on first level, declines practice of stairs. Patient is requesting a # in one Bedside toilet. No HHPT indicated.   Rada Hay 02/18/2018, 10:00 AM  Blanchard Kelch PT Acute Rehabilitation Services Pager [phone] Office [phone]

## 2018-02-18 NOTE — Discharge Instructions (Signed)
Cough, Adult  Coughing is a reflex that clears your throat and your airways. Coughing helps to heal and protect your lungs. It is normal to cough occasionally, but a cough that happens with other symptoms or lasts a long time may be a sign of a condition that needs treatment. A cough may last only 2-3 weeks (acute), or it may last longer than 8 weeks (chronic). What are the causes? Coughing is commonly caused by:  Breathing in substances that irritate your lungs.  A viral or bacterial respiratory infection.  Allergies.  Asthma.  Postnasal drip.  Smoking.  Acid backing up from the stomach into the esophagus (gastroesophageal reflux).  Certain medicines.  Chronic lung problems, including COPD (or rarely, lung cancer).  Other medical conditions such as heart failure. Follow these instructions at home: Pay attention to any changes in your symptoms. Take these actions to help with your discomfort:  Take medicines only as told by your health care provider. ? If you were prescribed an antibiotic medicine, take it as told by your health care provider. Do not stop taking the antibiotic even if you start to feel better. ? Talk with your health care provider before you take a cough suppressant medicine.  Drink enough fluid to keep your urine clear or pale yellow.  If the air is dry, use a cold steam vaporizer or humidifier in your bedroom or your home to help loosen secretions.  Avoid anything that causes you to cough at work or at home.  If your cough is worse at night, try sleeping in a semi-upright position.  Avoid cigarette smoke. If you smoke, quit smoking. If you need help quitting, ask your health care provider.  Avoid caffeine.  Avoid alcohol.  Rest as needed. Contact a health care provider if:  You have new symptoms.  You cough up pus.  Your cough does not get better after 2-3 weeks, or your cough gets worse.  You cannot control your cough with suppressant  medicines and you are losing sleep.  You develop pain that is getting worse or pain that is not controlled with pain medicines.  You have a fever.  You have unexplained weight loss.  You have night sweats. Get help right away if:  You cough up blood.  You have difficulty breathing.  Your heartbeat is very fast. This information is not intended to replace advice given to you by your health care provider. Make sure you discuss any questions you have with your health care provider. Document Released: 07/13/2010 Document Revised: 06/22/2015 Document Reviewed: 03/23/2014 Elsevier Interactive Patient Education  2019 [employer]  

## 2018-02-18 NOTE — Progress Notes (Signed)
Met with pt to discuss any follow up needed for mental health concerns- pt reports having been diagnosed with anxiety by PCP and taking Celexa. Reports she has never seen at therapist (had an inpatient admission at [facility] last year for SI/depression). Reports her family has wanted her to see a therapist but due to work she has not. States she does not want referral to therapist right now "because I am thinking about moving to [place] to be with my dad, I live with my mom here." States she plans to follow up with PCP for referral once she knows her plans. CSW processed with pt the incident leading to this admission. Pt reports that "I just saw it online and wanted to try it to lose weight. By the next morning I knew I had made a mistake." Pt reports she is not prone to impulsive behavior. Feels she "will consider what may be dangerous more" in the future. Requested her medical record from this encounter be shared with her PCP for follow up purposes.  Sharren Bridge, MSW, LCSW Clinical Social Work 02/18/2018 [phone]

## 2018-02-19 LAB — CULTURE, BLOOD (ROUTINE X 2)
Culture: NO GROWTH
Culture: NO GROWTH
SPECIAL REQUESTS: ADEQUATE
Special Requests: ADEQUATE

## 2018-02-24 ENCOUNTER — Inpatient Hospital Stay: Payer: BLUE CROSS/BLUE SHIELD | Admitting: Family Medicine

## 2018-03-03 ENCOUNTER — Encounter: Payer: Self-pay | Admitting: Family Medicine

## 2018-03-03 ENCOUNTER — Ambulatory Visit (INDEPENDENT_AMBULATORY_CARE_PROVIDER_SITE_OTHER): Payer: BLUE CROSS/BLUE SHIELD | Admitting: Family Medicine

## 2018-03-03 VITALS — BP 100/60 | HR 101 | Temp 98.5°F | Ht 62.0 in | Wt 234.4 lb

## 2018-03-03 DIAGNOSIS — J4520 Mild intermittent asthma, uncomplicated: Secondary | ICD-10-CM

## 2018-03-03 DIAGNOSIS — L03311 Cellulitis of abdominal wall: Secondary | ICD-10-CM

## 2018-03-03 DIAGNOSIS — D509 Iron deficiency anemia, unspecified: Secondary | ICD-10-CM | POA: Diagnosis not present

## 2018-03-03 MED ORDER — NAPROXEN 500 MG PO TABS: 500.0000 mg | ORAL_TABLET | Freq: Two times a day (BID) | ORAL | 0 refills | Status: DC

## 2018-03-03 NOTE — Assessment & Plan Note (Signed)
Diagnosed in the hospital and started on iron.  Tolerating iron supplementation well. - will recheck CBC today - cont iron

## 2018-03-03 NOTE — Patient Instructions (Signed)
Thank you for coming to see me today. It was a pleasure. Today we talked about:   Your abdominal wall pain: take naproxen as needed.    Please follow-up in 1 week.  If you start to have increased pain, notice redness, worsening swelling, or you have fevers, please call or come in.   You should never take more than 3000mg  of tylenol in 1 day.  If you have any questions or concerns, please do not hesitate to call the office at [phone].  Best,   Luis Abed, DO

## 2018-03-03 NOTE — Assessment & Plan Note (Addendum)
Improving.  Patient without signs of infection on exam and no fevers.  Previous imaging not suggestive of abscess.  [place] applied in clinic and no evidence of abscess.  Suspect that this is healing indurated tissue.  Reassured as no erythema or increased warmth of skin.  No need for resuming antibiotics at present. - CMP, CBC with diff - naproxen prn - tylenol prn, but advised to not take more than 3000mg  in 24 hrs, will check LFTs today - consider repeat US at follow up if worsening  - patient given appropriate return precautions and voiced understanding

## 2018-03-03 NOTE — Progress Notes (Signed)
Subjective: No chief complaint on file.    HPI: Renee [name] is a 23 y.o. presenting to clinic today to discuss the following:  1 Hospital follow up for Abdominal wall cellulitis  Patient was admitted to the hospital for sepsis 2/2 abdominal wall cellulitis following injection of Tide into her abdomen using a syringe purchased off [employer] in an attempt to lose weight.  She states that she finished course of augmentin 1/29.  She notes she was having some improvement in her abdominal swelling and pain, but since stopping the antibiotics, she states that it hasn't improved anymore.  She is worried that it is becoming an abscess.  Patient was taking vicodin for the pain, but states that she ran out.  She has been taking tylenol, 3 extra-strength every 3 hours to keep the pain away.  She states that the pain is 8/10 and worse when her bra hits it. No fevers, chills, vomiting.  Has had some nausea.  Has been eating well and tolerating a PO diet.  No SI/HI.  States that her mood has been fine with this situation.  Denies injecting things into her abdomen again and states, "I would never do that again."  2 Pneumonia/Asthma Follow Up Patient states that her breathing has improved and she has no current breathing complaints.  No cough.  She has used her albuterol inhaler about 3 times since discharge.  Completed antibiotics same as above.  3 Iron-deficiency anemia Patient noted to be iron deficient in hospital and started on iron supplementation.  She states that she is still taking.  No dark stools and no constipation. No fatigue or dizziness.  ROS noted in HPI.   Past Medical, Surgical, Social, and Family History Reviewed & Updated per EMR.   Pertinent Historical Findings include:   Social History   Tobacco Use  Smoking Status Never Smoker  Smokeless Tobacco Never Used      Objective: BP 100/60   Pulse (!) 101   Temp 98.5 F (36.9 C) (Oral)   Ht 5\' 2"  (1.575 m)   Wt 234 lb  6 oz (106.3 kg)   LMP 02/10/2018   SpO2 98%   BMI 42.87 kg/m  Vitals and nursing notes reviewed  Physical Exam: General: 23 y.o. female in NAD Cardio: RRR no m/r/g, HR 80s on my exam Lungs: CTAB, no wheezing, no rhonchi, no crackles, no increased work of breathing Abdomen: Soft, TTP left mid upper quadrant, no overlying skin changes or erythema or increased warmth, some indurated tissue on palpation, but no fluid collection palpated   No results found for this or any previous visit (from the past 72 hour(s)).  Assessment/Plan:  Abdominal wall cellulitis Improving.  Patient without signs of infection on exam and no fevers.  Previous imaging not suggestive of abscess.  [place] applied in clinic and no evidence of abscess.  Suspect that this is healing indurated tissue.  Reassured as no erythema or increased warmth of skin.  No need for resuming antibiotics at present. - CMP, CBC with diff - naproxen prn - tylenol prn, but advised to not take more than 3000mg  in 24 hrs, will check LFTs today - consider repeat US at follow up if worsening  - patient given appropriate return precautions and voiced understanding  Asthma Well-controlled at present.  Lung exam clear with good air movement.  - cont albuterol prn  Iron deficiency anemia Diagnosed in the hospital and started on iron.  Tolerating iron supplementation well. -  will recheck CBC today - cont iron      PATIENT EDUCATION PROVIDED: See AVS    Diagnosis and plan along with any newly prescribed medication(s) were discussed in detail with this patient today. The patient verbalized understanding and agreed with the plan. Patient advised if symptoms worsen return to clinic or ER.     Orders Placed This Encounter  Procedures  . CBC with Differential  . Comprehensive metabolic panel    Meds ordered this encounter  Medications  . DISCONTD: naproxen (NAPROSYN) 500 MG tablet    Sig: Take 1 tablet (500 mg total) by mouth 2 (two)  times daily with a meal.    Dispense:  30 tablet    Refill:  0  . naproxen (NAPROSYN) 500 MG tablet    Sig: Take 1 tablet (500 mg total) by mouth 2 (two) times daily with a meal.    Dispense:  30 tablet    Refill:  0     Luis AbedBailey Luvia Orzechowski, DO 03/03/2018, 5:12 PM PGY-1 [facility]

## 2018-03-03 NOTE — Assessment & Plan Note (Signed)
Well-controlled at present.  Lung exam clear with good air movement.  - cont albuterol prn

## 2018-03-04 ENCOUNTER — Encounter: Payer: Self-pay | Admitting: Family Medicine

## 2018-03-04 LAB — CBC WITH DIFFERENTIAL/PLATELET
BASOS ABS: 0.1 10*3/uL (ref 0.0–0.2)
BASOS: 1 %
EOS (ABSOLUTE): 0.1 10*3/uL (ref 0.0–0.4)
Eos: 1 %
Hematocrit: 38.2 % (ref 34.0–46.6)
Hemoglobin: 12.7 g/dL (ref 11.1–15.9)
Immature Grans (Abs): 0 10*3/uL (ref 0.0–0.1)
Immature Granulocytes: 0 %
Lymphocytes Absolute: 2.6 10*3/uL (ref 0.7–3.1)
Lymphs: 28 %
MCH: 28.4 pg (ref 26.6–33.0)
MCHC: 33.2 g/dL (ref 31.5–35.7)
MCV: 86 fL (ref 79–97)
Monocytes Absolute: 0.8 10*3/uL (ref 0.1–0.9)
Monocytes: 9 %
NEUTROS ABS: 5.6 10*3/uL (ref 1.4–7.0)
Neutrophils: 61 %
Platelets: 408 10*3/uL (ref 150–450)
RBC: 4.47 x10E6/uL (ref 3.77–5.28)
RDW: 13.5 % (ref 11.7–15.4)
WBC: 9.2 10*3/uL (ref 3.4–10.8)

## 2018-03-04 LAB — COMPREHENSIVE METABOLIC PANEL
ALT: 28 IU/L (ref 0–32)
AST: 28 IU/L (ref 0–40)
Albumin/Globulin Ratio: 1.2 (ref 1.2–2.2)
Albumin: 4 g/dL (ref 3.9–5.0)
Alkaline Phosphatase: 92 IU/L (ref 39–117)
BUN/Creatinine Ratio: 9 (ref 9–23)
BUN: 5 mg/dL — ABNORMAL LOW (ref 6–20)
Bilirubin Total: 0.4 mg/dL (ref 0.0–1.2)
CO2: 17 mmol/L — ABNORMAL LOW (ref 20–29)
Calcium: 9.1 mg/dL (ref 8.7–10.2)
Chloride: 103 mmol/L (ref 96–106)
Creatinine, Ser: 0.55 mg/dL — ABNORMAL LOW (ref 0.57–1.00)
GFR calc non Af Amer: 135 mL/min/{1.73_m2} (ref >59)
GFR, EST AFRICAN AMERICAN: 156 mL/min/{1.73_m2} (ref >59)
Globulin, Total: 3.4 g/dL (ref 1.5–4.5)
Glucose: 79 mg/dL (ref 65–99)
Potassium: 4.3 mmol/L (ref 3.5–5.2)
Sodium: 138 mmol/L (ref 134–144)
Total Protein: 7.4 g/dL (ref 6.0–8.5)

## 2018-03-04 NOTE — Progress Notes (Signed)
Labs WNL.  Letter sent.

## 2018-05-12 ENCOUNTER — Other Ambulatory Visit: Payer: Self-pay

## 2018-05-12 ENCOUNTER — Ambulatory Visit (INDEPENDENT_AMBULATORY_CARE_PROVIDER_SITE_OTHER): Payer: BC Managed Care – PPO | Admitting: Family Medicine

## 2018-05-12 VITALS — BP 102/60 | HR 85 | Temp 99.0°F | Wt 232.1 lb

## 2018-05-12 DIAGNOSIS — T162XXA Foreign body in left ear, initial encounter: Secondary | ICD-10-CM

## 2018-05-12 MED ORDER — IBUPROFEN 100 MG/5ML PO SUSP: 400.0000 mg | Freq: Four times a day (QID) | ORAL | 0 refills | Status: DC | PRN

## 2018-05-12 NOTE — Patient Instructions (Signed)
It was a pleasure to see you today! Thank you for choosing [facility] for your primary care. Renee [name] was seen for foreign body in ear. Unfortunately, we were unable to remove it. We are referring you to the ENT doctor to see if he or she can remove it.   Come back to the clinic if pain in your ear worsens, and go to the emergency room if if you get severe pain, headache, fevers, chills or any worrisome symptom.   Best,  Thomes Dinning, MD, MS FAMILY MEDICINE RESIDENT - PGY2 05/12/2018 11:35 AM

## 2018-05-12 NOTE — Addendum Note (Signed)
Addended by: Fanny Bien B on: 05/12/2018 12:02 PM   Modules accepted: Orders

## 2018-05-12 NOTE — Progress Notes (Signed)
Acute Office Visit  Subjective:    Patient ID: Renee [name], female    DOB: 1995-09-12, 23 y.o.   MRN: [id number]  Chief Complaint  Patient presents with  . bug in ear    Patient is in today for a bug in her hear. Patient felt "something" crawl in her hear. She is not sure what it is, but thinks it may be a roach. It crawled in her left ear. Her ear does hurt. There is no change in hearing. The pain does not radiate any where. She tried to extricate it manually, but was unsuccessful. Something is visualized on otoscopic exam. Possibly multiple insect legs. Multiple extraction attempts were made, but patient did not tolerate it very well when. Some debris was extracted, but the body of the insect remained. For extraction ear curette, elongated alligator forceps, and several peroxide rinses were used. Patient was given 400 mg of ibuprofen. After the attempted extraction, patient was still able to inflate her TM from within.   Past Medical History:  Diagnosis Date  . Anxiety   . Asthma     No past surgical history on file.  No family history on file.  Social History   Socioeconomic History  . Marital status: Single    Spouse name: Not on file  . Number of children: Not on file  . Years of education: Not on file  . Highest education level: Not on file  Occupational History  . Not on file  Social Needs  . Financial resource strain: Not on file  . Food insecurity:    Worry: Not on file    Inability: Not on file  . Transportation needs:    Medical: Not on file    Non-medical: Not on file  Tobacco Use  . Smoking status: Never Smoker  . Smokeless tobacco: Never Used  Substance and Sexual Activity  . Alcohol use: No  . Drug use: No  . Sexual activity: Never  Lifestyle  . Physical activity:    Days per week: Not on file    Minutes per session: Not on file  . Stress: Not on file  Relationships  . Social connections:    Talks on phone: Not on file    Gets together:  Not on file    Attends religious service: Not on file    Active member of club or organization: Not on file    Attends meetings of clubs or organizations: Not on file    Relationship status: Not on file  . Intimate partner violence:    Fear of current or ex partner: Not on file    Emotionally abused: Not on file    Physically abused: Not on file    Forced sexual activity: Not on file  Other Topics Concern  . Not on file  Social History Narrative  . Not on file    Outpatient Medications Prior to Visit  Medication Sig Dispense Refill  . albuterol (PROAIR HFA) 108 (90 Base) MCG/ACT inhaler Inhale 2 puffs into the lungs every 6 (six) hours. For shortness of breath 1 Inhaler 3  . citalopram (CELEXA) 20 MG tablet Take 20 mg by mouth daily.    . ferrous sulfate 325 (65 FE) MG tablet Take 1 tablet (325 mg total) by mouth daily with breakfast. 30 tablet 3  . hydrOXYzine (ATARAX/VISTARIL) 50 MG tablet Take 1 tablet (50 mg) by mouth four times daily as needed: For anxiety/sleep (Patient not taking: Reported on 02/13/2018) 90 tablet  0  . hydrOXYzine (VISTARIL) 50 MG capsule Take 50 mg by mouth at bedtime.    Marland Kitchen ibuprofen (ADVIL,MOTRIN) 200 MG tablet Take 600 mg by mouth every 6 (six) hours as needed for headache or mild pain.    . Melatonin 3 MG TABS Take 1 tablet (3 mg total) by mouth daily at 6 PM. For sleep (Patient not taking: Reported on 02/13/2018) 30 tablet 0  . naproxen (NAPROSYN) 500 MG tablet Take 1 tablet (500 mg total) by mouth 2 (two) times daily with a meal. 30 tablet 0  . ondansetron (ZOFRAN) 4 MG tablet Take 1 tablet (4 mg total) by mouth every 8 (eight) hours as needed for nausea or vomiting. 20 tablet 0   No facility-administered medications prior to visit.     No Known Allergies  As per HPI.     Objective:    Physical Exam  Constitutional: She appears well-developed and well-nourished. No distress.  HENT:  Head: Normocephalic and atraumatic.  Right Ear: External ear  normal.  Left Ear: Hearing normal.  Ears:  Cardiovascular: Normal rate.  Pulmonary/Chest: Effort normal. No respiratory distress.  Musculoskeletal: Normal range of motion.        General: No edema.  Neurological: She exhibits normal muscle tone.  Skin: Skin is warm and dry.  Psychiatric:  Anxious     BP 102/60   Pulse 85   Temp 99 F (37.2 C) (Oral)   Wt 232 lb 2 oz (105.3 kg)   LMP 04/13/2018   SpO2 97%   BMI 42.46 kg/m  Wt Readings from Last 3 Encounters:  05/12/18 232 lb 2 oz (105.3 kg)  03/03/18 234 lb 6 oz (106.3 kg)  02/11/18 213 lb (96.6 kg)    Health Maintenance Due  Topic Date Due  . HIV Screening  05/02/2012  . TETANUS/TDAP  05/02/2016  . PAP-Cervical Cytology Screening  05/03/2018  . PAP SMEAR-Modifier  05/03/2018    There are no preventive care reminders to display for this patient.   Lab Results  Component Value Date   TSH 5.382 (H) 08/29/2017   Lab Results  Component Value Date   WBC 9.2 03/03/2018   HGB 12.7 03/03/2018   HCT 38.2 03/03/2018   MCV 86 03/03/2018   PLT 408 03/03/2018   Lab Results  Component Value Date   NA 138 03/03/2018   K 4.3 03/03/2018   CO2 17 (L) 03/03/2018   GLUCOSE 79 03/03/2018   BUN 5 (L) 03/03/2018   CREATININE 0.55 (L) 03/03/2018   BILITOT 0.4 03/03/2018   ALKPHOS 92 03/03/2018   AST 28 03/03/2018   ALT 28 03/03/2018   PROT 7.4 03/03/2018   ALBUMIN 4.0 03/03/2018   CALCIUM 9.1 03/03/2018   ANIONGAP 8 02/18/2018   Lab Results  Component Value Date   CHOL 201 (H) 08/29/2017   Lab Results  Component Value Date   HDL 47 08/29/2017   Lab Results  Component Value Date   LDLCALC 135 (H) 08/29/2017   Lab Results  Component Value Date   TRIG 93 08/29/2017   Lab Results  Component Value Date   CHOLHDL 4.3 08/29/2017   Lab Results  Component Value Date   HGBA1C 4.8 08/29/2017       Assessment & Plan:   Problem List Items Addressed This Visit    None    Visit Diagnoses    Foreign body  in auricle of left ear, initial encounter    -  Primary  Relevant Orders   Ambulatory referral to ENT     Will refer to ENT for possible retrieval of insect form within patient's ear.   No orders of the defined types were placed in this encounter.    Garnette GunnerAaron B Hasna Stefanik, MD

## 2018-06-09 ENCOUNTER — Inpatient Hospital Stay (HOSPITAL_COMMUNITY)
Admission: RE | Admit: 2018-06-09 | Discharge: 2018-06-16 | DRG: 885 | Disposition: A | Discharge status: Home / Self Care | Payer: BLUE CROSS/BLUE SHIELD | Attending: Psychiatry | Admitting: Psychiatry

## 2018-06-09 ENCOUNTER — Encounter (HOSPITAL_COMMUNITY): Payer: Self-pay

## 2018-06-09 ENCOUNTER — Other Ambulatory Visit: Payer: Self-pay

## 2018-06-09 DIAGNOSIS — F259 Schizoaffective disorder, unspecified: Secondary | ICD-10-CM | POA: Diagnosis present

## 2018-06-09 DIAGNOSIS — G4700 Insomnia, unspecified: Secondary | ICD-10-CM | POA: Diagnosis present

## 2018-06-09 DIAGNOSIS — F19959 Other psychoactive substance use, unspecified with psychoactive substance-induced psychotic disorder, unspecified: Secondary | ICD-10-CM | POA: Diagnosis present

## 2018-06-09 DIAGNOSIS — F411 Generalized anxiety disorder: Secondary | ICD-10-CM | POA: Diagnosis present

## 2018-06-09 DIAGNOSIS — E876 Hypokalemia: Secondary | ICD-10-CM | POA: Diagnosis present

## 2018-06-09 DIAGNOSIS — F323 Major depressive disorder, single episode, severe with psychotic features: Secondary | ICD-10-CM | POA: Diagnosis present

## 2018-06-09 DIAGNOSIS — F329 Major depressive disorder, single episode, unspecified: Secondary | ICD-10-CM | POA: Diagnosis present

## 2018-06-09 DIAGNOSIS — F250 Schizoaffective disorder, bipolar type: Secondary | ICD-10-CM | POA: Diagnosis not present

## 2018-06-09 MED ORDER — ACETAMINOPHEN 325 MG PO TABS
650.0000 mg | ORAL_TABLET | Freq: Four times a day (QID) | ORAL | Status: DC | PRN
  Administered 2018-06-09 – 2018-06-10 (×2): 650 mg via ORAL
  Filled 2018-06-09 (×2): qty 2

## 2018-06-09 MED ORDER — LORAZEPAM 1 MG PO TABS
1.0000 mg | ORAL_TABLET | ORAL | Status: AC | PRN
  Administered 2018-06-10: 21:00:00 1 mg via ORAL
  Filled 2018-06-09: qty 1

## 2018-06-09 MED ORDER — HYDROXYZINE HCL 25 MG PO TABS
25.0000 mg | ORAL_TABLET | Freq: Three times a day (TID) | ORAL | Status: DC | PRN
  Administered 2018-06-09 – 2018-06-11 (×2): 25 mg via ORAL
  Filled 2018-06-09 (×2): qty 1

## 2018-06-09 MED ORDER — FLUOXETINE HCL 10 MG PO CAPS
10.0000 mg | ORAL_CAPSULE | Freq: Every day | ORAL | Status: DC
  Administered 2018-06-09 – 2018-06-11 (×3): 10 mg via ORAL
  Filled 2018-06-09 (×4): qty 1

## 2018-06-09 MED ORDER — TRAZODONE HCL 50 MG PO TABS
50.0000 mg | ORAL_TABLET | Freq: Every evening | ORAL | Status: DC | PRN
  Administered 2018-06-09 – 2018-06-16 (×7): 50 mg via ORAL
  Filled 2018-06-09 (×7): qty 1

## 2018-06-09 MED ORDER — QUETIAPINE FUMARATE 25 MG PO TABS
25.0000 mg | ORAL_TABLET | Freq: Every day | ORAL | Status: DC
  Administered 2018-06-09 – 2018-06-12 (×4): 25 mg via ORAL
  Filled 2018-06-09 (×6): qty 1

## 2018-06-09 MED ORDER — QUETIAPINE FUMARATE 50 MG PO TABS
50.0000 mg | ORAL_TABLET | Freq: Every day | ORAL | Status: DC
  Administered 2018-06-09: 21:00:00 50 mg via ORAL
  Filled 2018-06-09 (×3): qty 1

## 2018-06-09 MED ORDER — ALUM & MAG HYDROXIDE-SIMETH 200-200-20 MG/5ML PO SUSP: 30.0000 mL | ORAL | Status: DC | PRN

## 2018-06-09 MED ORDER — OLANZAPINE 5 MG PO TBDP
5.0000 mg | ORAL_TABLET | Freq: Three times a day (TID) | ORAL | Status: DC | PRN
  Administered 2018-06-10 – 2018-06-16 (×9): 5 mg via ORAL
  Filled 2018-06-09 (×9): qty 1

## 2018-06-09 MED ORDER — ZIPRASIDONE MESYLATE 20 MG IM SOLR: 20.0000 mg | INTRAMUSCULAR | Status: DC | PRN

## 2018-06-09 NOTE — Progress Notes (Signed)
D: Patient denies SI, HI or AVH. Patient is met in her room, she is laying in bed flat and preoccupied.  Prior to entering the room the patient was heard having a conversation with herself and would break into a fit of laughter.  Pt. Appears cautious, paranoid and forwards little on approach.  Pt. Did not attend evening wrap up group.  A: Patient given emotional support from RN. Patient encouraged to come to staff with concerns and/or questions. Patient's medication routine continued. Patient's orders and plan of care reviewed.   R: Patient remains appropriate and cooperative. Will continue to monitor patient q15 minutes for safety.

## 2018-06-09 NOTE — BH Assessment (Signed)
Assessment Note  Renee [name] is an 23 y.o. female who presented to [facility] seeking help for her mental health issues.  Patient states: "I am confused and tired, my body needs to relax, let my mind go by."  Patient presents as being disorganized with loose thought associations.  Patient states, "I came here to get what I need and go."  Patient denies SI/HI/Psychosis. Patient denies any prior suicide attempts of hallucinations. Patient states that she was last hospitalized at [facility] in February.  She currently does not have an outpatient mental health provider.  Patient states that she ran out of her medication, but was unable to identify how long she has been off her medication.  Patient states that she uses marijuana on occasion, but states that she has not had any since 05/03/2018.  Patient denies any history of abuse or self-mutilation.  She states that she has been sleeping okay and her appetite has been okay.  Patient has most recently been staying with her mother.  Patient is not currently employed.  Patient states that she applied for disability, but it was denied.  She states that she is currently in the appeal process.  Patient states that she has no current legal involvement.  Patient presented with a flat and blunted affect.  She was oriented and alert.  she did not appear to be responding to internal stimuli, but it is questionable because she was laughing inappropriately and was observed talking to herself on occasionally.  She appeared to be depressed and was experiencing lose thought associations.  Her judgment, insight and impulse control.  She was somewhat restless during her assessment.  TTS spoke to patient's mother, Niranjana Holzer [phone], in order to obtain collateral information.  Mother states that patient has been experiencing severe mood swings and has been very agitated at times.  Mother states that patient is more serene today and having a better day.  Mother states that she  has been hearing patient talk to herself and she can be heard talking to people who are not there.  Mother states that she talks out her thoughts.  Mother states that patient has been laughing inappropriately.  Mother states that prior to last night that patient had stayed in bed for four days and did not even bathe.  Mother states that patient's last hospitalization was a couple years ago.  Patient has not had a mental health provider since then and has not been on any medications.  Diagnosis: F23 Brief Psychotic Disorder  Past Medical History:  Past Medical History:  Diagnosis Date  . Anxiety   . Asthma     No past surgical history on file.  Family History: No family history on file.  Social History:  reports that she has never smoked. She has never used smokeless tobacco. She reports that she does not drink alcohol or use drugs.  Additional Social History:  Alcohol / Drug Use Pain Medications: See MAR Prescriptions: See MAR Over the Counter: See MAR History of alcohol / drug use?: Yes Longest period of sobriety (when/how long): unknown Substance #1 Name of Substance 1: marijuana 1 - Age of First Use: unknown 1 - Amount (size/oz): unknown 1 - Frequency: occasional 1 - Duration: unknown 1 - Last Use / Amount: last used on her birthday, 05/03/2016  CIWA:   COWS:    Allergies: No Known Allergies  Home Medications: (Not in a hospital admission)   OB/GYN Status:  No LMP recorded.  General Assessment Data Location  of Assessment: [facility] Assessment Services TTS Assessment: In system Is this a Tele or Face-to-Face Assessment?: Face-to-Face Is this an Initial Assessment or a Re-assessment for this encounter?: Initial Assessment Patient Accompanied by:: Parent Language Other than English: No Living Arrangements: Other (Comment)(lives with mother) What gender do you identify as?: Female Marital status: Single Maiden name: Kintzel Pregnancy Status: No Living Arrangements:  Parent Can pt return to current living arrangement?: Yes Admission Status: Voluntary Is patient capable of signing voluntary admission?: Yes Referral Source: Self/Family/Friend Insurance type: self-pay  Medical Screening Exam [facility] Walk-in ONLY) Medical Exam completed: Yes  Crisis Care Plan Living Arrangements: Parent Legal Guardian: Other:(self) Name of Psychiatrist: none Name of Therapist: none  Education Status Is patient currently in school?: No Is the patient employed, unemployed or receiving disability?: Unemployed  Risk to self with the past 6 months Suicidal Ideation: No Has patient been a risk to self within the past 6 months prior to admission? : No Suicidal Intent: No Has patient had any suicidal intent within the past 6 months prior to admission? : No Is patient at risk for suicide?: Yes Suicidal Plan?: No Has patient had any suicidal plan within the past 6 months prior to admission? : No Access to Means: No What has been your use of drugs/alcohol within the last 12 months?: occasional marijuana use Previous Attempts/Gestures: Yes How many times?: 1(age 11) Other Self Harm Risks: (none) Triggers for Past Attempts: Unknown Intentional Self Injurious Behavior: Cutting(has cut herself before) Comment - Self Injurious Behavior: (cutting at age 36) Family Suicide History: No Recent stressful life event(s): Other (Comment)(none reported) Persecutory voices/beliefs?: No Depression: Yes Depression Symptoms: Despondent, Isolating, Loss of interest in usual pleasures, Feeling worthless/self pity Substance abuse history and/or treatment for substance abuse?: No Suicide prevention information given to non-admitted patients: Not applicable  Risk to Others within the past 6 months Homicidal Ideation: No Does patient have any lifetime risk of violence toward others beyond the six months prior to admission? : No Thoughts of Harm to Others: No Current Homicidal Intent:  No Current Homicidal Plan: No Access to Homicidal Means: No Identified Victim: (none) History of harm to others?: No Assessment of Violence: None Noted Violent Behavior Description: none Does patient have access to weapons?: No Criminal Charges Pending?: No Does patient have a court date: No Is patient on probation?: No  Psychosis Hallucinations: Auditory Delusions: None noted  Mental Status Report Appearance/Hygiene: Unremarkable Eye Contact: Good Motor Activity: Restlessness Speech: Logical/coherent Level of Consciousness: Alert Mood: Depressed, Anxious Affect: Depressed Anxiety Level: Moderate Thought Processes: Flight of Ideas Judgement: Impaired Orientation: Person, Place, Time, Situation Obsessive Compulsive Thoughts/Behaviors: None  Cognitive Functioning Concentration: Decreased Memory: Recent Intact, Remote Intact Is patient IDD: No Insight: Poor Impulse Control: Poor Appetite: Good Have you had any weight changes? : No Change Sleep: Increased Vegetative Symptoms: Not bathing  ADLScreening [facility] Assessment Services) Patient's cognitive ability adequate to safely complete daily activities?: Yes Patient able to express need for assistance with ADLs?: Yes Independently performs ADLs?: Yes (appropriate for developmental age)  Prior Inpatient Therapy Prior Inpatient Therapy: Yes Prior Therapy Dates: 2018 Prior Therapy Facilty/Provider(s): [facility] Reason for Treatment: depression  Prior Outpatient Therapy Prior Outpatient Therapy: No Does patient have an ACCT team?: No Does patient have Intensive In-House Services?  : No Does patient have [facility] services? : No Does patient have P4CC services?: No  ADL Screening (condition at time of admission) Patient's cognitive ability adequate to safely complete daily activities?: Yes Is the patient  deaf or have difficulty hearing?: No Does the patient have difficulty seeing, even when wearing glasses/contacts?: No Does  the patient have difficulty concentrating, remembering, or making decisions?: No Patient able to express need for assistance with ADLs?: Yes Does the patient have difficulty dressing or bathing?: No Independently performs ADLs?: Yes (appropriate for developmental age) Does the patient have difficulty walking or climbing stairs?: No Weakness of Legs: None Weakness of Arms/Hands: None  Home Assistive Devices/Equipment Home Assistive Devices/Equipment: None  Therapy Consults (therapy consults require a physician order) PT Evaluation Needed: No OT Evalulation Needed: No SLP Evaluation Needed: No Abuse/Neglect Assessment (Assessment to be complete while patient is alone) Abuse/Neglect Assessment Can Be Completed: Yes Physical Abuse: Denies Verbal Abuse: Denies Sexual Abuse: Denies Exploitation of patient/patient's resources: Denies Self-Neglect: Denies Values / Beliefs Cultural Requests During Hospitalization: None Spiritual Requests During Hospitalization: None Consults Spiritual Care Consult Needed: No Social Work Consult Needed: No Merchant navy officerAdvance Directives (For Healthcare) Does Patient Have a Medical Advance Directive?: No Would patient like information on creating a medical advance directive?: No - Patient declined Nutrition Screen- [facility] Adult/[facility]/AP Has the patient recently lost weight without trying?: No Has the patient been eating poorly because of a decreased appetite?: No Malnutrition Screening Tool Score: 0        Disposition: Per Denzil MagnusonLaShunda Thomas, NP, patient is recommended for inpatient treatment Disposition Initial Assessment Completed for this Encounter: Yes Disposition of Patient: (pending provider review)  On Site Evaluation by:   Reviewed with Physician:    Arnoldo Lenisanny J Chevy Virgo 06/09/2018 12:52 PM

## 2018-06-09 NOTE — Progress Notes (Signed)
Patient ID: Renee [name], female   DOB: 1995-02-26, 24 y.o.   MRN: [id number]  [place] NOVEL CORONAVIRUS (COVID-19) DAILY CHECK-OFF SYMPTOMS - answer yes or no to each - every day NO YES  Have you had a fever in the past 24 hours?  . Fever (Temp > 37.80C / 100F) X   Have you had any of these symptoms in the past 24 hours? . New Cough .  Sore Throat  .  Shortness of Breath .  Difficulty Breathing .  Unexplained Body Aches   X   Have you had any one of these symptoms in the past 24 hours not related to allergies?   . Runny Nose .  Nasal Congestion .  Sneezing   X   If you have had runny nose, nasal congestion, sneezing in the past 24 hours, has it worsened?  X   EXPOSURES - check yes or no X   Have you traveled outside the state in the past 14 days?  X   Have you been in contact with someone with a confirmed diagnosis of COVID-19 or PUI in the past 14 days without wearing appropriate PPE?  X   Have you been living in the same home as a person with confirmed diagnosis of COVID-19 or a PUI (household contact)?    X   Have you been diagnosed with COVID-19?    X              What to do next: Answered NO to all: Answered YES to anything:   Proceed with unit schedule Follow the [facility] Inpatient Flowsheet.

## 2018-06-09 NOTE — H&P (Addendum)
Psychiatric Admission Assessment Adult  Patient Identification: ASLIN FARINAS MRN:  [id number] Date of Evaluation:  06/09/2018 Chief Complaint:  "I need my meds so I can chill." Principal Diagnosis: Schizoaffective disorder ([place]) Diagnosis:  Principal Problem:   Schizoaffective disorder ([place]) Active Problems:   Substance-induced psychotic disorder ([place])   MDD (major depressive disorder)  History of Present Illness: Ms. Holcomb is a 23 year old female with history of depression, substance induced psychotic disorder, schizoaffective disorder, and GAD, presenting for treatment voluntarily as a walk-in. She presents as distracted and disorganized, smiling inappropriately and mumbling to self at times. TTS collected collateral information from her mother, who reported that she has not bathed for the last several days and had been staying in bed all day but not sleeping. Patient reports increased energy over the last several days and states she stayed up all night for several nights cleaning the house. Reports fatigue and states she needs medication to "chill" now. She reports marijuana use "here and there," but denies other drug/alcohol use. UDS, BAL and other labs pending. She denies AVH but does appear to be responding to internal stimuli. She was admitted to [facility] in August 2019 for depression with psychotic features and discharged on Prozac and Abilify. She has not been taking these medications recently. She states Abilify was "nasty," and did not want to take it. Denies SI/HI. Denies pregnancy. TTS NP and counselor contacted mother for collateral information as listed below  From TTS: As per mother, patient has talked to herself in the past however, she feels as though, because patient seems depressed, patient is talking to her self more often. She reports that patient has been experiencing increased anxiety and appears to be responding  to internal stimuli at times. She states," she talks to  herself more and responds. She laughs out loud and in the past, she has stated that somebody else is talking to her." Mother states that patient has not been sleeping well for the past 3-4 days (altghough she rested better last night). She also states that prior to last night,patient would stay in the bed and for the past 3-4 days, she had not bathed. Reports patient does have anger issues although she does not belives patient is a threat to herself or others. When asked if she felt like patient was paranoid she replied, " no not at all."  She reports patient was on psychotropic medications after she was discharged although she stopped taking them shortly after her discharged. Reports patient refused to go to her outpatient appointments. Mother states that patient has been experiencing severe mood swings and has been very agitated at times. Mother states that patient is more serene today and having a better day.  Associated Signs/Symptoms: Depression Symptoms:  depressed mood, anhedonia, insomnia, fatigue, difficulty concentrating, decreased appetite, (Hypo) Manic Symptoms:  Distractibility, Labiality of Mood, Anxiety Symptoms:  Excessive Worry, Psychotic Symptoms:  Denies psychotic symptoms but responding to internal stimuli on assessment PTSD Symptoms: Negative Total Time spent with patient: 30 minutes  Past Psychiatric History: Hospitalized at [facility] in August 2019 for depression with psychosis and discharged on Prozac and Abilify. Per mother she did not follow up with outpatient providers or take medication. History of marijuana use. No history of suicide attempts.  Is the patient at risk to self? Yes.    Has the patient been a risk to self in the past 6 months? Yes.    Has the patient been a risk to self within the distant past? Yes.  Is the patient a risk to others? No.  Has the patient been a risk to others in the past 6 months? No.  Has the patient been a risk to others within the  distant past? No.   Prior Inpatient Therapy: Prior Inpatient Therapy: Yes Prior Therapy Dates: 2018 Prior Therapy Facilty/Provider(s): [facility] Reason for Treatment: depression Prior Outpatient Therapy: Prior Outpatient Therapy: No Does patient have an ACCT team?: No Does patient have Intensive In-House Services?  : No Does patient have [facility] services? : No Does patient have P4CC services?: No  Alcohol Screening: 1. How often do you have a drink containing alcohol?: Never 2. How many drinks containing alcohol do you have on a typical day when you are drinking?: 1 or 2 3. How often do you have six or more drinks on one occasion?: Never AUDIT-C Score: 0 9. Have you or someone else been injured as a result of your drinking?: No 10. Has a relative or friend or a doctor or another health worker been concerned about your drinking or suggested you cut down?: No Alcohol Use Disorder Identification Test Final Score (AUDIT): 0 Alcohol Brief Interventions/Follow-up: AUDIT Score <7 follow-up not indicated Substance Abuse History in the last 12 months:  Yes.   Consequences of Substance Abuse: Negative Previous Psychotropic Medications: Yes  Psychological Evaluations: No  Past Medical History:  Past Medical History:  Diagnosis Date  . Anxiety   . Asthma    History reviewed. No pertinent surgical history. Family History: History reviewed. No pertinent family history. Family Psychiatric  History: Patient reported mother has schizophrenia. Tobacco Screening: Have you used any form of tobacco in the last 30 days? (Cigarettes, Smokeless Tobacco, Cigars, and/or Pipes): No Social History:  Social History   Substance and Sexual Activity  Alcohol Use No     Social History   Substance and Sexual Activity  Drug Use No    Additional Social History: Marital status: Single    Pain Medications: See MAR Prescriptions: See MAR Over the Counter: See MAR History of alcohol / drug use?: Yes Longest  period of sobriety (when/how long): unknown Name of Substance 1: marijuana 1 - Age of First Use: unknown 1 - Amount (size/oz): unknown 1 - Frequency: occasional 1 - Duration: unknown 1 - Last Use / Amount: last used on her birthday, 05/03/2016                  Allergies:  No Known Allergies Lab Results: No results found for this or any previous visit (from the past 48 hour(s)).  Blood Alcohol level:  Lab Results  Component Value Date   ETH <10 08/27/2017   ETH <10 26/94/8546    Metabolic Disorder Labs:  Lab Results  Component Value Date   HGBA1C 4.8 08/29/2017   MPG 91.06 08/29/2017   Lab Results  Component Value Date   PROLACTIN 93.0 (H) 08/29/2017   Lab Results  Component Value Date   CHOL 201 (H) 08/29/2017   TRIG 93 08/29/2017   HDL 47 08/29/2017   CHOLHDL 4.3 08/29/2017   VLDL 19 08/29/2017   LDLCALC 135 (H) 08/29/2017   LDLCALC 117 (H) 10/26/2010    Current Medications: Current Facility-Administered Medications  Medication Dose Route Frequency Provider Last Rate Last Dose  . acetaminophen (TYLENOL) tablet 650 mg  650 mg Oral Q6H PRN Mordecai Maes, NP      . alum & mag hydroxide-simeth (MAALOX/MYLANTA) 200-200-20 MG/5ML suspension 30 mL  30 mL Oral Q4H PRN Mordecai Maes,  NP      . FLUoxetine (PROZAC) capsule 10 mg  10 mg Oral Daily Connye Burkitt, NP      . hydrOXYzine (ATARAX/VISTARIL) tablet 25 mg  25 mg Oral TID PRN Connye Burkitt, NP      . OLANZapine zydis (ZYPREXA) disintegrating tablet 5 mg  5 mg Oral Q8H PRN Connye Burkitt, NP       And  . LORazepam (ATIVAN) tablet 1 mg  1 mg Oral PRN Connye Burkitt, NP       And  . ziprasidone (GEODON) injection 20 mg  20 mg Intramuscular PRN Connye Burkitt, NP      . QUEtiapine (SEROQUEL) tablet 25 mg  25 mg Oral Daily Connye Burkitt, NP      . QUEtiapine (SEROQUEL) tablet 50 mg  50 mg Oral QHS Connye Burkitt, NP      . traZODone (DESYREL) tablet 50 mg  50 mg Oral QHS PRN Connye Burkitt, NP       PTA  Medications: Medications Prior to Admission  Medication Sig Dispense Refill Last Dose  . albuterol (PROAIR HFA) 108 (90 Base) MCG/ACT inhaler Inhale 2 puffs into the lungs every 6 (six) hours. For shortness of breath 1 Inhaler 3   . citalopram (CELEXA) 20 MG tablet Take 20 mg by mouth daily.   Not Taking at Unknown time  . hydrOXYzine (ATARAX/VISTARIL) 50 MG tablet Take 1 tablet (50 mg) by mouth four times daily as needed: For anxiety/sleep (Patient not taking: Reported on 02/13/2018) 90 tablet 0 Not Taking at Unknown time    Musculoskeletal: Strength & Muscle Tone: within normal limits Gait & Station: normal Patient leans: N/A  Psychiatric Specialty Exam: Physical Exam  Nursing note and vitals reviewed. Constitutional: She is oriented to person, place, and time. She appears well-developed and well-nourished.  Cardiovascular: Normal rate.  Respiratory: Effort normal.  Neurological: She is alert and oriented to person, place, and time.    Review of Systems  Constitutional: Negative.   Respiratory: Negative for cough and shortness of breath.   Cardiovascular: Negative for chest pain.  Gastrointestinal: Positive for abdominal pain (reports menstrual cramps). Negative for nausea and vomiting.  Psychiatric/Behavioral: Positive for depression, hallucinations and substance abuse (THC). Negative for suicidal ideas. The patient has insomnia. The patient is not nervous/anxious.     Blood pressure 119/72, pulse 83, temperature 98 F (36.7 C), temperature source Oral, resp. rate 18, height _0  (1.575 m), weight 97.1 kg, SpO2 99 %.Body mass index is 39.14 kg/m.  General Appearance: Disheveled  Eye Contact:  Poor  Speech:  Slow  Volume:  Decreased  Mood:  Irritable  Affect:  Non-Congruent, Inappropriate and Labile  Thought Process:  Descriptions of Associations: Loose  Orientation:  Full (Time, Place, and Person)  Thought Content:  Tangential  Suicidal Thoughts:  No  Homicidal Thoughts:   No  Memory:  Immediate;   Fair Recent;   Fair  Judgement:  Impaired  Insight:  Shallow  Psychomotor Activity:  Normal  Concentration:  Concentration: Poor and Attention Span: Poor  Recall:  [employer] of Knowledge:  Fair  Language:  Fair  Akathisia:  No  Handed:  Right  AIMS (if indicated):     Assets:  Housing Leisure Time Resilience Social Support  ADL's:  Intact  Cognition:  WNL  Sleep:       Treatment Plan Summary: Daily contact with patient to assess and evaluate symptoms and progress in  treatment and Medication management   Inpatient hospitalization.  Start Seroquel 25 mg PO QAM, 50 mg PO QHS for psychosis Start Prozac 10 mg PO daily for depression/anxiety Start Vistaril 25 mg PO TID PRN anxiety Start trazodone 50 mg PO QHS PRN insomnia Start agitation protocol PRN agitation  Patient will participate in the therapeutic group milieu.  Discharge disposition in progress.   Observation Level/Precautions:  15 minute checks  Laboratory:  CBC CMP BAL UDS a1c lipid panel prolactin TSH urine hcg  Psychotherapy:  Group therapy  Medications:  See MAR  Consultations:  PRN  Discharge Concerns:  Safety and stabilization  Estimated LOS: 3-5 days  Other:      Physician Treatment Plan for Primary Diagnosis: Schizoaffective disorder ([place]) Long Term Goal(s): Improvement in symptoms so as ready for discharge  Short Term Goals: Ability to identify changes in lifestyle to reduce recurrence of condition will improve, Ability to verbalize feelings will improve and Ability to disclose and discuss suicidal ideas  Physician Treatment Plan for Secondary Diagnosis: Principal Problem:   Schizoaffective disorder ([place]) Active Problems:   Substance-induced psychotic disorder ([place])   MDD (major depressive disorder)  Long Term Goal(s): Improvement in symptoms so as ready for discharge  Short Term Goals: Ability to demonstrate self-control will improve and Ability to identify and  develop effective coping behaviors will improve  I certify that inpatient services furnished can reasonably be expected to improve the patient's condition.    Connye Burkitt, NP 5/12/20203:43 PM   I have discussed case with NP and have met with patient  Agree with NP note and assessment  23 year old single female, no children, lives with mother. Reports she had been working at a [employer] but recently lost job due to [employer]. Presented to hospital earlier today reporting needing " medication adjustment". Presents as fair historian and provides vague description of symptoms/ issues leading to admission. States she has been off psychiatric medications for at least several days, and feels she needs to restart psychiatric meds, but notes  that Abilify, which had been prescribed during a prior admission, was not well tolerated . She states she has been feeling depressed, but currently denies suicidal ideations or significant neuro-vegetative symptoms. Patient visibly internally preoccupied during session, appearing distracted and speaking under her breath/mumbling to self at times, exhibiting inappropriate smiling and laughing at times . Staff obtained collateral information from mother, with whom patient lives, and who reported that patient has been non compliant with medications, appearing  internally preoccupied , not sleeping, neglecting ADLs/ not bathing , with mood instability and  episodes of agitation  Patient has history of prior psychiatric admission in Augsit of 2019. At the time presented for depression, suicidal ideations, self cutting , responding to internal stimulii. She was diagnosed with MDD with psychotic features and notes also indicate prior disagnosis of Schizoaffective Disorder. At the time was discharged on Prozac and Abilify.  Patient endorses occasional cannabis use, denies other drug or alcohol abuse   She denies medical illnesses , NKDA  Dx -  Schizoaffective Disorder, consider Cannabis Abuse, substance induced psychosis  Plan- inpatient admission.  We discussed options and she does express interest in restarting medications. As noted, reports Abilify was not well tolerated . Start Seroquel 25 mgr QAM and 50 mgrs QHS and restart Prozac 10 mgrs QAM Agitation Protocol PRN for psychotic agitation if needed Check BMP, CBC, Lipid Panel, HgbA1C, EKG .

## 2018-06-09 NOTE — [facility] Suicide Risk Assessment (Signed)
[facility] Admission Suicide Risk Assessment   Nursing information obtained from:  Patient, Review of record Demographic factors:  Adolescent or young adult Current Mental Status:  NA Loss Factors:  NA Historical Factors:  Impulsivity Risk Reduction Factors:  Living with another person, especially a relative, Positive social support  Total Time spent with patient: 45 minutes Principal Problem: Schizoaffective disorder (H[facility]) Diagnosis:  Principal Problem:   Schizoaffective disorder ([facility]) Active Problems:   Substance-induced psychotic disorder ([facility])   MDD (major depressive disorder)  Subjective Data:   Continued Clinical Symptoms:  Alcohol Use Disorder Identification Test Final Score (AUDIT): 0 The "Alcohol Use Disorders Identification Test", Guidelines for Use in Primary Care, Second Edition.  World Science writer [facility]). Score between 0-7:  no or low risk or alcohol related problems. Score between 8-15:  moderate risk of alcohol related problems. Score between 16-19:  high risk of alcohol related problems. Score 20 or above:  warrants further diagnostic evaluation for alcohol dependence and treatment.   CLINICAL FACTORS:  23 year old single female, no children, lives with mother. Reports she had been working at a [employer] but recently lost job due to [employer]. Presented to hospital earlier today reporting needing " medication adjustment". Presents as fair historian and provides vague description of symptoms/ issues leading to admission. States she has been off psychiatric medications for at least several days, and feels she needs to restart psychiatric meds, but notes  that Abilify, which had been prescribed during a prior admission, was not well tolerated . She states she has been feeling depressed, but currently denies suicidal ideations or significant neuro-vegetative symptoms. Patient visibly internally preoccupied during session, appearing distracted and  speaking under her breath/mumbling to self at times, exhibiting inappropriate smiling and laughing at times . Staff obtained collateral information from mother, with whom patient lives, and who reported that patient has been non compliant with medications, appearing  internally preoccupied , not sleeping, neglecting ADLs/ not bathing , with mood instability and  episodes of agitation  Patient has history of prior psychiatric admission in Augsit of 2019. At the time presented for depression, suicidal ideations, self cutting , responding to internal stimulii. She was diagnosed with MDD with psychotic features and notes also indicate prior disagnosis of Schizoaffective Disorder. At the time was discharged on Prozac and Abilify.  Patient endorses occasional cannabis use, denies other drug or alcohol abuse   She denies medical illnesses , NKDA  Dx - Schizoaffective Disorder, consider Cannabis Abuse, substance induced psychosis  Plan- inpatient admission.  We discussed options and she does express interest in restarting medications. As noted, reports Abilify was not well tolerated . Start Seroquel 25 mgr QAM and 50 mgrs QHS and restart Prozac 10 mgrs QAM Agitation Protocol PRN for psychotic agitation if needed Check BMP, CBC, Lipid Panel, HgbA1C, EKG .  Musculoskeletal: Strength & Muscle Tone: within normal limits Gait & Station: normal Patient leans: N/A  Psychiatric Specialty Exam: Physical Exam  ROS denies fever or cough/shortness of breath, denies nausea or vomiting, endorses pelvic discomfort, cramps associated with menses   Blood pressure 119/72, pulse 83, temperature 98 F (36.7 C), temperature source Oral, resp. rate 18, height 5\' 2"  (1.575 m), weight 97.1 kg, SpO2 99 %.Body mass index is 39.14 kg/m.  General Appearance: Fairly Groomed  Eye Contact:  Fair  Speech:  Normal Rate  Volume:  variable, whispers unintelligbly at times   Mood:  Anxious and Depressed  Affect:  guarded,  inappropriate affect ( smiling ,  laughing at times ) noted   Thought Process:  Goal Directed and Descriptions of Associations: Circumstantial  Orientation:  Other:  fully alert and attentive, oriented to May, 2020, and [facility] [place]  Thought Content:  denies hallucinations, but presents internally preoccupied, no delusions expressed   Suicidal Thoughts:  No denies suicidal or self injurious ideations at this time, and contracts for safety on unit, denies homicidal or violent ideations  Homicidal Thoughts:  No  Memory:  recent and remote fair   Judgement:  Impaired  Insight:  Lacking  Psychomotor Activity:  Normal- no current psychomotor agitation  Concentration:  Concentration: Fair and Attention Span: Fair  Recall:  [employer]  Fund of Knowledge:  Fair  Language:  Fair  Akathisia:  Negative  Handed:  Right  AIMS (if indicated):     Assets:  Desire for Improvement Resilience Social Support  ADL's:  Intact  Cognition:  Impaired,  Mild  Sleep:          COGNITIVE FEATURES THAT CONTRIBUTE TO RISK:  Closed-mindedness and Loss of executive function    SUICIDE RISK:   Moderate:  Frequent suicidal ideation with limited intensity, and duration, some specificity in terms of plans, no associated intent, good self-control, limited dysphoria/symptomatology, some risk factors present, and identifiable protective factors, including available and accessible social support.  PLAN OF CARE: Patient will be admitted to inpatient psychiatric unit for stabilization and safety. Will provide and encourage milieu participation. Provide medication management and maked adjustments as needed.  Will follow daily.    I certify that inpatient services furnished can reasonably be expected to improve the patient's condition.   Craige CottaFernando A , MD 06/09/2018, 4:01 PM

## 2018-06-09 NOTE — Tx Team (Signed)
Initial Treatment Plan 06/09/2018 2:46 PM Renee [name] ZOX:[id number]    PATIENT STRESSORS: Medication change or noncompliance Substance abuse   PATIENT STRENGTHS: Physical Health Supportive family/friends   PATIENT IDENTIFIED PROBLEMS: "get my medications right again"  Psychosis                   DISCHARGE CRITERIA:  Ability to meet basic life and health needs Adequate post-discharge living arrangements Improved stabilization in mood, thinking, and/or behavior Medical problems require only outpatient monitoring Motivation to continue treatment in a less acute level of care Need for constant or close observation no longer present Reduction of life-threatening or endangering symptoms to within safe limits Safe-care adequate arrangements made Verbal commitment to aftercare and medication compliance  PRELIMINARY DISCHARGE PLAN: Outpatient therapy  PATIENT/FAMILY INVOLVEMENT: This treatment plan has been presented to and reviewed with the patient, Renee [name].  The patient and family have been given the opportunity to ask questions and make suggestions.  Ferrel Logan, RN 06/09/2018, 2:46 PM

## 2018-06-09 NOTE — Progress Notes (Addendum)
Patient ID: Renee [name], female   DOB: 1996-01-06, 23 y.o.   MRN: [id number]  Admission Note  D) Patient admitted to the adult unit 400 hall. Patient is a 23 year old female who presents as a walk-in. Patient is a poor historian during the assessment. Patient appears to be talking to herself and responding to internal stimuli. Patient laughs inappropriately at times. Patient often unable to focus on answering questions. Patient states she is here for medication management and states she does not want to go to groups. Patient denies SI/HI but does endorse depression, anxiety and poor sleep. Patient plans to discharge home to her mom. Patient denies drug use, UDS to be obtained.   Skin assessment was completed and unremarkable. Patient belongings searched with no contraband found. Belongings secured in locker. Vital signs obtained. Per [facility] Tina T, pt does not need a Covid test.   A) Plan of care, unit policies and patient expectations were explained. Written consents obtained. Patient oriented to the unit and their room. Patient placed on standard q15 safety checks. Low fall risk precautions initiated and reviewed with patient.   R) Patient is resting in her room. Patient asking to be moved to 500 hall, however no beds are available at this time. Patient contracts for safety with staff on the unit.

## 2018-06-09 NOTE — H&P (Signed)
Behavioral Health Medical Screening Exam  Renee [name] is an 23 y.o. female.who presented as a walk-in to [facility]. When asked for her reason for coming to the hospital she replied, " I just need to get medication and leave. I am confused and need something to help my mind. I need something to help me sleep" Patient endorses mood swings, irritability, anxiety, decreased sleep and feelings of depression. She denies wanting to harm herself or others. She denies auditory or visual hallucinations and paranoid thoughts although she is visibly noted inappropriately laughing and talking to herself. Per chart review, patient has a psychiatric history of schizoaffective disorder, substance induced psychotic disorder, GAD and depression. She denies any recent substance abuse or use however, per chart review, patient admitted to smoking marijauan with last use 05/03/2018.Marland Kitchen. [facility] and at that time after she superficially cut her wrist and passive SI. She was discharged on Prozac, Abilify and hydroxyzine. She was scheduled for outpatient services with [facility] although she reports she has not had outpatient services in months and that she has not taken her medication in months.   Collateral information: Contacted patients mother to collect collateral information. As per mother, patient has talked to herself int he past however, she feels as though, because patient seems depressed, patient is talking to her self more often. She reports that patient has been experiencing increased anxiety and appears to be responding  to internal stimuli at times. She states," she talks to herself more and responds. She laughs out loud and in the past, she has stated that somebody else is talking to her." Mother states that patient has not been sleeping well for the past 3-4 days (altghough she rested better last night). She also states that prior to last night,patient would stay in the bed and  for the past 3-4 days, she had not bathed.  Reports patient does have anger issues although she does not belives patient is a threat to herself or others. When asked if she felt like patient was paranoid she replied, " no not at all."  She reports patient was on psychotropic medications after she was discharged although she stopped taking them shortly after her discharged. Reports patient refused to go to her outpatient appointments.   Per TTS:  TTS spoke to patient's mother, Renee [name] [phone], in order to obtain collateral information.  Mother states that patient has been experiencing severe mood swings and has been very agitated at times.  Mother states that patient is more serene today and having a better day.  Mother states that she has been hearing patient talk to herself and she can be heard talking to people who are not there.  Mother states that she talks out her thoughts.  Mother states that patient has been laughing inappropriately.  Mother states that prior to last night that patient had stayed in bed for four days and did not even bathe.  Mother states that patient's last hospitalization was a couple years ago.  Patient has not had a mental health provider since then and has not been on any medications.   Total Time spent with patient: 45 minutes  Psychiatric Specialty Exam: Physical Exam  Nursing note and vitals reviewed. Constitutional: She is oriented to person, place, and time.  Neurological: She is alert and oriented to person, place, and time.    Review of Systems  Psychiatric/Behavioral: Positive for depression. Negative for hallucinations, memory loss, substance abuse and suicidal ideas. The  patient is nervous/anxious and has insomnia.   All other systems reviewed and are negative.   There were no vitals taken for this visit.There is no height or weight on file to calculate BMI.  General Appearance: Fairly Groomed  Eye Contact:  Fair  Speech:  Clear and Coherent  and Normal Rate  Volume:  Normal  Mood:  Anxious and Depressed  Affect:  Blunt and Flat  Thought Process:  Coherent, Disorganized and Descriptions of Associations: Loose  Orientation:  Full (Time, Place, and Person)  Thought Content:  patient noted to be talking and laughing to self.   Suicidal Thoughts:  No  Homicidal Thoughts:  No  Memory:  Immediate;   Fair Recent;   Fair  Judgement:  Fair  Insight:  Shallow  Psychomotor Activity:  Normal  Concentration: Concentration: Fair and Attention Span: Fair  Recall:  [employer] of Knowledge:Fair  Language: Good  Akathisia:  Negative  Handed:  Right  AIMS (if indicated):     Assets:  Communication Skills Desire for Improvement Resilience Social Support  Sleep:       Musculoskeletal: Strength & Muscle Tone: within normal limits Gait & Station: normal Patient leans: N/A  There were no vitals taken for this visit.  Recommendations:  Based on my evaluation the patient does not appear to have an emergency medical condition.   Patient does  meet criteria for psychiatric inpatient admission. She will be admitted to the adult psychiatric unit.   Denzil Magnuson, NP 06/09/2018, 1:02 PM

## 2018-06-09 NOTE — Progress Notes (Signed)
Psychoeducational Group Note  Date:  06/09/2018 Time:  2256  Group Topic/Focus:  Wrap-Up Group:   The focus of this group is to help patients review their daily goal of treatment and discuss progress on daily workbooks.  Participation Level: Did Not Attend  Participation Quality:  Not Applicable  Affect:  Not Applicable  Cognitive:  Not Applicable  Insight:  Not Applicable  Engagement in Group: Not Applicable  Additional Comments:  The patient did not attend group this evening since she remained in her bedroom.   Hazle Coca S 06/09/2018, 10:56 PM

## 2018-06-10 MED ORDER — QUETIAPINE FUMARATE 100 MG PO TABS
100.0000 mg | ORAL_TABLET | Freq: Every day | ORAL | Status: DC
  Administered 2018-06-10: 20:00:00 100 mg via ORAL
  Filled 2018-06-10 (×3): qty 1

## 2018-06-10 NOTE — Progress Notes (Signed)
[facility] MD Progress Note  06/10/2018 3:17 PM Renee [name]  MRN:  [id number] Subjective: Patient states "I am okay" ".  At this time denies suicidal ideations.  Denies medication side effects.  She denies auditory hallucinations(although does present internally preoccupied at times) Objective : I have discussed case with treatment team and have met with patient.  23 year old female, no children, lives with her mother.  Currently unemployed.  Presented to hospital voluntarily for "medication adjustment".  Presented as fair historian with vague description of symptoms, stating she has been off her psychiatric medications prior to admission and describing some depression and neurovegetative symptoms.  She was noted to be internally preoccupied, responding to internal stimuli, but denied auditory hallucinations.  Collateral information from mother indicated that patient has been noncompliant with medications, neglecting ADLs, not sleeping well, internally preoccupied, with mood instability and episodes of agitation.  Patient has a history of prior psychiatric admission, and has been diagnosed with schizoaffective disorder in the past. Today patient presents with some improvement.  Grooming has improved partially compared to admission and affect appears somewhat more reactive.  She states her mood is better and currently does not endorse depression or severe neurovegetative symptoms.  She denies suicidal ideations.  She does continue to appear internally preoccupied at times (appearing distracted, laughing for no particular reason, whispering at times), although to a lesser degree than on admission.  She states she made "right decision" and coming to the hospital because "I needed my medications adjusted".  She is hopeful for discharge soon.  She is currently on Seroquel, Prozac.  Denies side effects thus far.  No disruptive or agitated behaviors on unit, limited milieu participation.  Staff reports that  patient has appeared internally preoccupied at times, with inappropriate affect. Routine labs are ordered, not done yet.  I have encouraged patient to allow blood draw and explained importance of obtaining routine blood work as part of assessment. 5/12 EKG NSR ,HR 85, QTc 430 Principal Problem: Schizoaffective disorder (HCC) Diagnosis: Principal Problem:   Schizoaffective disorder (HCC) Active Problems:   Substance-induced psychotic disorder (HCC)   MDD (major depressive disorder)  Total Time spent with patient: 20 minutes  Past Psychiatric History:   Past Medical History:  Past Medical History:  Diagnosis Date  . Anxiety   . Asthma    History reviewed. No pertinent surgical history. Family History: History reviewed. No pertinent family history. Family Psychiatric  History:  Social History:  Social History   Substance and Sexual Activity  Alcohol Use No     Social History   Substance and Sexual Activity  Drug Use No    Social History   Socioeconomic History  . Marital status: Single    Spouse name: Not on file  . Number of children: Not on file  . Years of education: Not on file  . Highest education level: Not on file  Occupational History  . Not on file  Social Needs  . Financial resource strain: Not on file  . Food insecurity:    Worry: Not on file    Inability: Not on file  . Transportation needs:    Medical: Not on file    Non-medical: Not on file  Tobacco Use  . Smoking status: Never Smoker  . Smokeless tobacco: Never Used  Substance and Sexual Activity  . Alcohol use: No  . Drug use: No  . Sexual activity: Never  Lifestyle  . Physical activity:    Days per week: Not on file  Minutes per session: Not on file  . Stress: Not on file  Relationships  . Social connections:    Talks on phone: Not on file    Gets together: Not on file    Attends religious service: Not on file    Active member of club or organization: Not on file    Attends meetings  of clubs or organizations: Not on file    Relationship status: Not on file  Other Topics Concern  . Not on file  Social History Narrative  . Not on file   Additional Social History:    Pain Medications: See MAR Prescriptions: See MAR Over the Counter: See MAR History of alcohol / drug use?: Yes Longest period of sobriety (when/how long): unknown Name of Substance 1: marijuana 1 - Age of First Use: unknown 1 - Amount (size/oz): unknown 1 - Frequency: occasional 1 - Duration: unknown 1 - Last Use / Amount: last used on her birthday, 05/03/2016  Sleep: Poor  Appetite:  Fair  Current Medications: Current Facility-Administered Medications  Medication Dose Route Frequency Provider Last Rate Last Dose  . acetaminophen (TYLENOL) tablet 650 mg  650 mg Oral Q6H PRN Mordecai Maes, NP   650 mg at 06/09/18 2114  . alum & mag hydroxide-simeth (MAALOX/MYLANTA) 200-200-20 MG/5ML suspension 30 mL  30 mL Oral Q4H PRN Mordecai Maes, NP      . FLUoxetine (PROZAC) capsule 10 mg  10 mg Oral Daily Connye Burkitt, NP   10 mg at 06/10/18 8828  . hydrOXYzine (ATARAX/VISTARIL) tablet 25 mg  25 mg Oral TID PRN Connye Burkitt, NP   25 mg at 06/09/18 2351  . OLANZapine zydis (ZYPREXA) disintegrating tablet 5 mg  5 mg Oral Q8H PRN Connye Burkitt, NP   5 mg at 06/10/18 0034   And  . LORazepam (ATIVAN) tablet 1 mg  1 mg Oral PRN Connye Burkitt, NP       And  . ziprasidone (GEODON) injection 20 mg  20 mg Intramuscular PRN Connye Burkitt, NP      . QUEtiapine (SEROQUEL) tablet 25 mg  25 mg Oral Daily Connye Burkitt, NP   25 mg at 06/10/18 9179  . QUEtiapine (SEROQUEL) tablet 50 mg  50 mg Oral QHS Connye Burkitt, NP   50 mg at 06/09/18 2114  . traZODone (DESYREL) tablet 50 mg  50 mg Oral QHS PRN Connye Burkitt, NP   50 mg at 06/09/18 2348    Lab Results: No results found for this or any previous visit (from the past 48 hour(s)).  Blood Alcohol level:  Lab Results  Component Value Date   ETH <10  08/27/2017   ETH <10 15/05/6977    Metabolic Disorder Labs: Lab Results  Component Value Date   HGBA1C 4.8 08/29/2017   MPG 91.06 08/29/2017   Lab Results  Component Value Date   PROLACTIN 93.0 (H) 08/29/2017   Lab Results  Component Value Date   CHOL 201 (H) 08/29/2017   TRIG 93 08/29/2017   HDL 47 08/29/2017   CHOLHDL 4.3 08/29/2017   VLDL 19 08/29/2017   LDLCALC 135 (H) 08/29/2017   LDLCALC 117 (H) 10/26/2010    Physical Findings: AIMS: Facial and Oral Movements Muscles of Facial Expression: None, normal Lips and Perioral Area: None, normal Jaw: None, normal Tongue: None, normal,Extremity Movements Upper (arms, wrists, hands, fingers): None, normal Lower (legs, knees, ankles, toes): None, normal, Trunk Movements Neck, shoulders, hips: None, normal, Overall  Severity Severity of abnormal movements (highest score from questions above): None, normal Incapacitation due to abnormal movements: None, normal Patient's awareness of abnormal movements (rate only patient's report): No Awareness, Dental Status Current problems with teeth and/or dentures?: No Does patient usually wear dentures?: No  CIWA:    COWS:     Musculoskeletal: Strength & Muscle Tone: within normal limits no restlessness or agitation Gait & Station: normal Patient leans: N/A  Psychiatric Specialty Exam: Physical Exam  ROS denies chest pain, denies shortness of breath, denies cough, denies nausea or vomiting  Blood pressure 119/72, pulse 83, temperature 98 F (36.7 C), temperature source Oral, resp. rate 18, height '5\' 2"'  (1.575 m), weight 97.1 kg, SpO2 99 %.Body mass index is 39.14 kg/m.  General Appearance: Fairly Groomed  Eye Contact:  Fair, but improved compared to admission  Speech:  Normal Rate  Volume:  Decreased  Mood:  Describes her mood as "okay", denies depression at this time  Affect:  Inappropriate affect at times-smiling/laughing briefly for no particular reason  Thought Process:   Disorganized and Descriptions of Associations: Circumstantial-although presents more organized today  Orientation:  Other:  She is a alert, attentive, and oriented x3 at this time  Thought Content:  She denies hallucinations but as above presents internally preoccupied.  No delusions are expressed at this time  Suicidal Thoughts:  No denies suicidal or self-injurious ideations, denies homicidal or violent ideations, contracts for safety on unit  Homicidal Thoughts:  No  Memory:  Recent and remote fair  Judgement:  Fair  Insight:  Fair  Psychomotor Activity:  Decreased-no agitation or restlessness  Concentration:  Concentration: Fair and Attention Span: Fair  Recall:  [employer] of Knowledge:  Fair  Language:  Fair  Akathisia:  Negative  Handed:  Right  AIMS (if indicated):     Assets:  Desire for Improvement Resilience  ADL's: Fair  Cognition:  WNL  Sleep:  Number of Hours: 2.25   Assessment 23 year old female, no children, lives with her mother.  Currently unemployed.  Presented to hospital voluntarily for "medication adjustment".  Presented as fair historian with vague description of symptoms, stating she has been off her psychiatric medications prior to admission and describing some depression and neurovegetative symptoms.  She was noted to be internally preoccupied, responding to internal stimuli, but denied auditory hallucinations.  Collateral information from mother indicated that patient has been noncompliant with medications, neglecting ADLs, not sleeping well, internally preoccupied, with mood instability and episodes of agitation.  Patient has a history of prior psychiatric admission, and has been diagnosed with schizoaffective disorder in the past. At present patient states she is feeling better, today minimizes/denies depression.  Denies suicidal ideations.  Grooming and overall relatedness appears partially improved today.  She continues to deny auditory hallucinations although  internally preoccupation/distractibility/inappropriate affect is still noted.  Thus far tolerating Prozac/Seroquel well, denies side effects. Treatment Plan Summary: Daily contact with patient to assess and evaluate symptoms and progress in treatment, Medication management, Plan Inpatient treatment and Medications as below Encourage group/milieu participation to work on coping skills and symptom reduction Increase Seroquel to 25 mg every morning and 100 mg nightly for mood disorder/psychosis Continue Prozac at 10 mg daily for mood disorder Continue Vistaril 25 mg every 8 hours PRN for anxiety Continue Trazodone 50 mg nightly PRN for insomnia Continue agitation protocol ( Zyprexa /Ativan PO / Geodon IM) for acute psychotic agitation as needed Will reorder routine labs-CBC, CMP, hemoglobin A1c, lipid panel, TSH, pregnancy  test  Jenne Campus, MD 06/10/2018, 3:17 PM

## 2018-06-10 NOTE — [facility] Suicide Risk Assessment (Signed)
[facility] INPATIENT:  Family/Significant Other Suicide Prevention Education  Suicide Prevention Education:  Education Completed; with mother, Gerrianne Abbasi [phone] has been identified by the patient as the family member/significant other with whom the patient will be residing, and identified as the person(s) who will aid the patient in the event of a mental health crisis (suicidal ideations/suicide attempt).  With written consent from the patient, the family member/significant other has been provided the following suicide prevention education, prior to the and/or following the discharge of the patient.  The suicide prevention education provided includes the following:  Suicide risk factors  Suicide prevention and interventions  National Suicide Hotline telephone number  [facility] assessment telephone number  [facility] Emergency Assistance 911  [facility] and/or Residential Mobile Crisis Unit telephone number  Request made of family/significant other to:  Remove weapons (e.g., guns, rifles, knives), all items previously/currently identified as safety concern.    Remove drugs/medications (over-the-counter, prescriptions, illicit drugs), all items previously/currently identified as a safety concern.  The family member/significant other verbalizes understanding of the suicide prevention education information provided.  The family member/significant other agrees to remove the items of safety concern listed above.   Mother was pleasant and forthcoming with collateral information, but did not appear to be very knowledgeable about mental health.   Mother reports that patient has been experiencing audio hallucinations for a couple of months and would exhibit paranoid or delusional thoughts while working at [employer]. Mother says patient would return from work with complaints that her coworkers were "making her sick" or doing things to her. Mother shares that patient  talks and laughs to her self pretty frequently, but mother doesn't seem to think this is abnormal. Mother speculates that the patient reads a lot of fiction/fairy tales and maybe the patient talking to herself and having these delusional thoughts is a related form of escapism.  Mother reports when patient was discharged from [facility] last year, the patient was angry and guarded and did not want her mother included in her treatment. Mother took patient to one appointment at [facility] of the [place], but they had a negative interaction with a receptionist and did not go back or continue with medications.  Mother thinks patient will be just fine if she gets some medication for her racing thoughts and anxiety. CSW discussed the importance of maintaining outpatient appointments and medication compliance. Mother was agreeable but reports patient is moving in with her father in [place] at discharge.   Mother asked if it would be possible for psychiatry to call her and review medications. No other questions or concerns for CSW at this time.  Darreld Mclean 06/10/2018, 3:06 PM

## 2018-06-10 NOTE — Progress Notes (Signed)
The second UA container was given to patient this afternoon.  UA obtained and placed in lab refrigerator for pick up.

## 2018-06-10 NOTE — Tx Team (Signed)
Interdisciplinary Treatment and Diagnostic Plan Update  06/10/2018 Time of Session: 9:00am Renee [name] MRN: [id number]  Principal Diagnosis: Schizoaffective disorder [facility])  Secondary Diagnoses: Principal Problem:   Schizoaffective disorder (HCC) Active Problems:   Substance-induced psychotic disorder (HCC)   MDD (major depressive disorder)   Current Medications:  Current Facility-Administered Medications  Medication Dose Route Frequency Provider Last Rate Last Dose  . acetaminophen (TYLENOL) tablet 650 mg  650 mg Oral Q6H PRN Denzil Magnuson, NP   650 mg at 06/09/18 2114  . alum & mag hydroxide-simeth (MAALOX/MYLANTA) 200-200-20 MG/5ML suspension 30 mL  30 mL Oral Q4H PRN Denzil Magnuson, NP      . FLUoxetine (PROZAC) capsule 10 mg  10 mg Oral Daily Aldean Baker, NP   10 mg at 06/10/18 6837  . hydrOXYzine (ATARAX/VISTARIL) tablet 25 mg  25 mg Oral TID PRN Aldean Baker, NP   25 mg at 06/09/18 2351  . OLANZapine zydis (ZYPREXA) disintegrating tablet 5 mg  5 mg Oral Q8H PRN Aldean Baker, NP   5 mg at 06/10/18 2902   And  . LORazepam (ATIVAN) tablet 1 mg  1 mg Oral PRN Aldean Baker, NP       And  . ziprasidone (GEODON) injection 20 mg  20 mg Intramuscular PRN Aldean Baker, NP      . QUEtiapine (SEROQUEL) tablet 25 mg  25 mg Oral Daily Aldean Baker, NP   25 mg at 06/10/18 1115  . QUEtiapine (SEROQUEL) tablet 50 mg  50 mg Oral QHS Aldean Baker, NP   50 mg at 06/09/18 2114  . traZODone (DESYREL) tablet 50 mg  50 mg Oral QHS PRN Aldean Baker, NP   50 mg at 06/09/18 2348   PTA Medications: Medications Prior to Admission  Medication Sig Dispense Refill Last Dose  . albuterol (PROAIR HFA) 108 (90 Base) MCG/ACT inhaler Inhale 2 puffs into the lungs every 6 (six) hours. For shortness of breath 1 Inhaler 3   . citalopram (CELEXA) 20 MG tablet Take 20 mg by mouth daily.   Not Taking at Unknown time  . hydrOXYzine (ATARAX/VISTARIL) 50 MG tablet Take 1 tablet (50 mg) by  mouth four times daily as needed: For anxiety/sleep (Patient not taking: Reported on 02/13/2018) 90 tablet 0 Not Taking at Unknown time    Patient Stressors: Medication change or noncompliance Substance abuse  Patient Strengths: Physical Health Supportive family/friends  Treatment Modalities: Medication Management, Group therapy, Case management,  1 to 1 session with clinician, Psychoeducation, Recreational therapy.   Physician Treatment Plan for Primary Diagnosis: Schizoaffective disorder (HCC) Long Term Goal(s): Improvement in symptoms so as ready for discharge Improvement in symptoms so as ready for discharge   Short Term Goals: Ability to identify changes in lifestyle to reduce recurrence of condition will improve Ability to verbalize feelings will improve Ability to disclose and discuss suicidal ideas Ability to demonstrate self-control will improve Ability to identify and develop effective coping behaviors will improve  Medication Management: Evaluate patient's response, side effects, and tolerance of medication regimen.  Therapeutic Interventions: 1 to 1 sessions, Unit Group sessions and Medication administration.  Evaluation of Outcomes: Progressing  Physician Treatment Plan for Secondary Diagnosis: Principal Problem:   Schizoaffective disorder (HCC) Active Problems:   Substance-induced psychotic disorder (HCC)   MDD (major depressive disorder)  Long Term Goal(s): Improvement in symptoms so as ready for discharge Improvement in symptoms so as ready for discharge   Short Term Goals: Ability  to identify changes in lifestyle to reduce recurrence of condition will improve Ability to verbalize feelings will improve Ability to disclose and discuss suicidal ideas Ability to demonstrate self-control will improve Ability to identify and develop effective coping behaviors will improve     Medication Management: Evaluate patient's response, side effects, and tolerance of  medication regimen.  Therapeutic Interventions: 1 to 1 sessions, Unit Group sessions and Medication administration.  Evaluation of Outcomes: Progressing   RN Treatment Plan for Primary Diagnosis: Schizoaffective disorder (HCC) Long Term Goal(s): Knowledge of disease and therapeutic regimen to maintain health will improve  Short Term Goals: Ability to verbalize feelings will improve, Ability to identify and develop effective coping behaviors will improve and Compliance with prescribed medications will improve  Medication Management: RN will administer medications as ordered by provider, will assess and evaluate patient's response and provide education to patient for prescribed medication. RN will report any adverse and/or side effects to prescribing provider.  Therapeutic Interventions: 1 on 1 counseling sessions, Psychoeducation, Medication administration, Evaluate responses to treatment, Monitor vital signs and CBGs as ordered, Perform/monitor CIWA, COWS, AIMS and Fall Risk screenings as ordered, Perform wound care treatments as ordered.  Evaluation of Outcomes: Progressing   LCSW Treatment Plan for Primary Diagnosis: Schizoaffective disorder (HCC) Long Term Goal(s): Safe transition to appropriate next level of care at discharge, Engage patient in therapeutic group addressing interpersonal concerns.  Short Term Goals: Engage patient in aftercare planning with referrals and resources, Increase social support, Identify triggers associated with mental health/substance abuse issues and Increase skills for wellness and recovery  Therapeutic Interventions: Assess for all discharge needs, 1 to 1 time with Social worker, Explore available resources and support systems, Assess for adequacy in community support network, Educate family and significant other(s) on suicide prevention, Complete Psychosocial Assessment, Interpersonal group therapy.  Evaluation of Outcomes: Progressing   Progress in  Treatment: Attending groups: No. Participating in groups: No. Taking medication as prescribed: Yes. Toleration medication: Yes. Family/Significant other contact made: No, will contact:  supports if consents are granted Patient understands diagnosis: Yes. Discussing patient identified problems/goals with staff: Yes. Medical problems stabilized or resolved: No. Denies suicidal/homicidal ideation: Yes. Issues/concerns per patient self-inventory: No.  New problem(s) identified: No, Describe:  CSW continuing to assess  New Short Term/Long Term Goal(s): medication management for mood stabilization; elimination of SI thoughts; development of comprehensive mental wellness/sobriety plan.  Patient Goals: Get back on medication.  Discharge Plan or Barriers: CSW continuing to assess for appropriate referrals.  Reason for Continuation of Hospitalization: Anxiety Depression Hallucinations - appears to be responding to internal stimuli  Estimated Length of Stay: 5-7 days  Attendees: Patient: Renee [name] 06/10/2018 9:05 AM  Physician:  06/10/2018 9:05 AM  Nursing:  06/10/2018 9:05 AM  RN Care Manager: 06/10/2018 9:05 AM  Social Worker: Enid Cutterharlotte Jayleon Mcfarlane, LCSWA 06/10/2018 9:05 AM  Recreational Therapist:  06/10/2018 9:05 AM  Other:  06/10/2018 9:05 AM  Other:  06/10/2018 9:05 AM  Other: 06/10/2018 9:05 AM    Scribe for Treatment Team: Darreld Mcleanharlotte C Jessamine Barcia, LCSWA 06/10/2018 9:05 AM

## 2018-06-10 NOTE — Plan of Care (Signed)
Nurse discussed anxiety, depression, coping skills with patient. 

## 2018-06-10 NOTE — [facility] Counselor (Signed)
Adult Comprehensive Assessment  Patient WU:JWJXBJY:Renee [name],femaleDOB:07/27/1995,23 y.o.NWG:[id number]  Information Source: Information source: Patient  Current Stressors: Patient states their primary concerns and needs for treatment are: Was having racing thoughts. Her [facility]. Patient states their goals for this hospitilization and ongoing recovery are: "See what I can do to get my life going." Educational / Learning stressors: Not a student, would like to be.  Employment / Job issues: Not employed. Family Relationships: Patient denies. Financial / Lack of resources (include bankruptcy): Patient denies, is financially supported by mother. Housing / Lack of housing: [place] denies stressors, lives with mother.  Physical health (include injuries & life threatening diseases): Patient denies. Social relationships: Patient denies. Substance abuse: Patient denies, endorses occasional THC use. Bereavement / Loss: Patient denies.  Living/Environment/Situation: Living Arrangements: Parent Living conditions (as described by patient or guardian): [place]. Who else lives in the home?: Patient lives in the home with her mother and brother. How long has patient lived in current situation?: Current home close to 2 years, with mom her entire life. What is atmosphere in current home: Comfortable, Loving  Family History: Marital status: Single Are you sexually active?: No What is your sexual orientation?: Heterosexual Has your sexual activity been affected by drugs, alcohol, medication, or emotional stress?: No Does patient have children?: No  Childhood History: By whom was/is the patient raised?: Both parents Additional childhood history information: Patient was raised primarily by her mother but her father would get her sometimes. Description of patient's relationship  with caregiver when they were a child: Patient stated she had a good relationship with her mother. Patient stated she had a good relationship with her father. Patient's description of current relationship with people who raised him/her: Patient stated she has a good relationship with her mother and father. How were you disciplined when you got in trouble as a child/adolescent?: Patient stated she received punishment such as phone being taken away. She received a spanking sometimes. Does patient have siblings?: Yes Number of Siblings: 2(Patient has one 21 yo brother; father and his girlfriend are expecting a baby.) Description of patient's current relationship with siblings: Patient stated she has a good relationship with her brother. Did patient suffer any verbal/emotional/physical/sexual abuse as a child?: No Did patient suffer from severe childhood neglect?: No Has patient ever been sexually abused/assaulted/raped as an adolescent or adult?: No Was the patient ever a victim of a crime or a disaster?: No Witnessed domestic violence?: No Has patient been effected by domestic violence as an adult?: No  Education: Highest grade of school patient has completed: 11th Currently a student?: No Learning disability?: No  Employment/Work Situation: Employment situation: Unemployed Patient's job has been impacted by current illness: No What is the longest time patient has a held a job?: For about 2 weeks. Where was the patient employed at that time?: [employer] Did You Receive Any Psychiatric Treatment/Services While in the Military?: No Are There Guns or Other Weapons in Your Home?: No  Financial Resources: Financial resources: Support from parents / caregiver Does patient have a Lawyerrepresentative payee or guardian?: No  Alcohol/Substance Abuse: What has been your use of drugs/alcohol within the last 12 months?: Smokes marijuana "here and there." If attempted suicide, did  drugs/alcohol play a role in this?: No Alcohol/Substance Abuse Treatment Hx: Denies past history Has alcohol/substance abuse ever caused legal problems?: No  Social Support System: [employer] CorporationPatient's Community  Support System: Good Describe Community Support System: Mother, brother Type of faith/religion: Christianity/Baptist How does patient's faith help to cope with current illness?: Go to church more and get out of the house more.  Leisure/Recreation: Leisure and Hobbies: Reading  Strengths/Needs: What is the patient's perception of their strengths?: Patient is good in biology, drawing Patient states these barriers may affect/interfere with their treatment: Patient identified no barriers Patient states these barriers may affect their return to the community: Patient identified no barriers. Other important information patient would like considered in planning for their treatment: Asked about discharge.  Discharge Plan: Currently receiving community mental health services: No Patient states concerns and preferences for aftercare planning are: Patient stated she has no concerns or preferences, asked CSW to call her mother about providers.  Patient states they will know when they are safe and ready for discharge when: Patient stated she doesn't know, she is ready now and she does't really feel like anything is wrong with her.  Does patient have access to transportation?: Yes, mother will usually drive her to appointments. Does patient have financial barriers related to discharge medications?: No Will patient be returning to same living situation after discharge?: Yes  Summary/Recommendations:   Summary and Recommendations (to be completed by the evaluator): Renee [name] is a 23 year old female, who presents voluntarily as a walk in. Patient reports experiencing racing thoughts and wanted to get re-started on medications. Patient was pleasant and cooperative, but appeared to be responding to  internal stimuli. Patient was previously hospitalized at [facility] in 2019. Patient will benefit from crisis stabilization, medication evaluation, therapeutic milieu, and referral for services. At discharge it is recommended that Patient adhere to the established discharge plan and continue in treatment.    Darreld Mclean. 06/10/2018

## 2018-06-10 NOTE — Progress Notes (Signed)
Patient has been in her room most of the day, sitting on her bed or on the bench in her room.  Patient appears to be preoccupied but has denied hearing voices to nurse today.  Patient also denied SI and HI.  Denied visual hallucinations.   Patient has taken her medications today.  Emotional support and encouragement given patient. Safety maintained with 15 minute checks. UA container given to patient for urine sample today.

## 2018-06-10 NOTE — Plan of Care (Signed)
D: Patient is responding to internal stimuli and is mildly confused. Denies SI, HI, AVH, and verbally contracts for safety. Though she denies AVH she is talking and laughing with someone who is not there. She will go from laughing to being distraught. She is insistent that she needs a pregnancy test. This RN explained that the urine that was collected today will be tested but the test isn't complete yet. Patient reports "I need a stimulant", "I need something to calm me down". She is anxious and irritable, easily redirectable, but needs to be redirected many times due to confusion. She reports pain but cannot express where.    A: Medications administered per MD order. Support provided. Patient educated on safety on the unit and medications. Routine safety checks every 15 minutes. Patient stated understanding to tell nurse about any new physical symptoms. Patient understands to tell staff of any needs.     R: No adverse drug reactions noted. Patient verbally contracts for safety. Patient remains safe at this time and will continue to monitor.   Problem: Safety: Goal: Periods of time without injury will increase Outcome: Progressing   Patient remains safe and will continue to monitor.   [place] NOVEL CORONAVIRUS (COVID-19) DAILY CHECK-OFF SYMPTOMS - answer yes or no to each - every day NO YES  Have you had a fever in the past 24 hours?  Fever (Temp > 37.80C / 100F) X   Have you had any of these symptoms in the past 24 hours? New Cough  Sore Throat   Shortness of Breath  Difficulty Breathing  Unexplained Body Aches   X   Have you had any one of these symptoms in the past 24 hours not related to allergies?   Runny Nose  Nasal Congestion  Sneezing   X   If you have had runny nose, nasal congestion, sneezing in the past 24 hours, has it worsened?  X   EXPOSURES - check yes or no X   Have you traveled outside the state in the past 14 days?  X   Have you been in contact with someone with  a confirmed diagnosis of COVID-19 or PUI in the past 14 days without wearing appropriate PPE?  X   Have you been living in the same home as a person with confirmed diagnosis of COVID-19 or a PUI (household contact)?    X   Have you been diagnosed with COVID-19?    X              What to do next: Answered NO to all: Answered YES to anything:   Proceed with unit schedule Follow the [facility] Inpatient Flowsheet.

## 2018-06-11 LAB — CBC WITH DIFFERENTIAL/PLATELET
Abs Immature Granulocytes: 0.03 10*3/uL (ref 0.00–0.07)
Basophils Absolute: 0 10*3/uL (ref 0.0–0.1)
Basophils Relative: 0 %
Eosinophils Absolute: 0 10*3/uL (ref 0.0–0.5)
Eosinophils Relative: 1 %
HCT: 36.4 % (ref 36.0–46.0)
Hemoglobin: 12.1 g/dL (ref 12.0–15.0)
Immature Granulocytes: 0 %
Lymphocytes Relative: 30 %
Lymphs Abs: 2.6 10*3/uL (ref 0.7–4.0)
MCH: 28.7 pg (ref 26.0–34.0)
MCHC: 33.2 g/dL (ref 30.0–36.0)
MCV: 86.5 fL (ref 80.0–100.0)
Monocytes Absolute: 0.7 10*3/uL (ref 0.1–1.0)
Monocytes Relative: 7 %
Neutro Abs: 5.4 10*3/uL (ref 1.7–7.7)
Neutrophils Relative %: 62 %
Platelets: 313 10*3/uL (ref 150–400)
RBC: 4.21 MIL/uL (ref 3.87–5.11)
RDW: 12.3 % (ref 11.5–15.5)
WBC: 8.8 10*3/uL (ref 4.0–10.5)
nRBC: 0 % (ref 0.0–0.2)

## 2018-06-11 LAB — URINALYSIS, ROUTINE W REFLEX MICROSCOPIC
Bilirubin Urine: NEGATIVE
Glucose, UA: NEGATIVE mg/dL
Ketones, ur: NEGATIVE mg/dL
Nitrite: NEGATIVE
Protein, ur: 30 mg/dL — AB
Specific Gravity, Urine: 1.029 (ref 1.005–1.030)
pH: 6 (ref 5.0–8.0)

## 2018-06-11 LAB — RAPID URINE DRUG SCREEN, HOSP PERFORMED
Amphetamines: NOT DETECTED
Barbiturates: NOT DETECTED
Benzodiazepines: NOT DETECTED
Cocaine: NOT DETECTED
Opiates: NOT DETECTED
Tetrahydrocannabinol: POSITIVE — AB

## 2018-06-11 LAB — COMPREHENSIVE METABOLIC PANEL
ALT: 21 U/L (ref 0–44)
AST: 22 U/L (ref 15–41)
Albumin: 3.6 g/dL (ref 3.5–5.0)
Alkaline Phosphatase: 77 U/L (ref 38–126)
Anion gap: 7 (ref 5–15)
BUN: 9 mg/dL (ref 6–20)
CO2: 26 mmol/L (ref 22–32)
Calcium: 8.9 mg/dL (ref 8.9–10.3)
Chloride: 105 mmol/L (ref 98–111)
Creatinine, Ser: 0.67 mg/dL (ref 0.44–1.00)
GFR calc Af Amer: >60 mL/min (ref >60)
GFR calc non Af Amer: >60 mL/min (ref >60)
Glucose, Bld: 103 mg/dL — ABNORMAL HIGH (ref 70–99)
Potassium: 2.9 mmol/L — ABNORMAL LOW (ref 3.5–5.1)
Sodium: 138 mmol/L (ref 135–145)
Total Bilirubin: 0.4 mg/dL (ref 0.3–1.2)
Total Protein: 8 g/dL (ref 6.5–8.1)

## 2018-06-11 LAB — HEMOGLOBIN A1C
Hgb A1c MFr Bld: 5.1 % (ref 4.8–5.6)
Mean Plasma Glucose: 99.67 mg/dL

## 2018-06-11 LAB — HCG, QUANTITATIVE, PREGNANCY: hCG, Beta Chain, Quant, S: <1 m[IU]/mL (ref <5)

## 2018-06-11 LAB — LIPID PANEL
Cholesterol: 158 mg/dL (ref 0–200)
HDL: 35 mg/dL — ABNORMAL LOW (ref >40)
LDL Cholesterol: 112 mg/dL — ABNORMAL HIGH (ref 0–99)
Total CHOL/HDL Ratio: 4.5 RATIO
Triglycerides: 54 mg/dL (ref <150)
VLDL: 11 mg/dL (ref 0–40)

## 2018-06-11 LAB — PREGNANCY, URINE: Preg Test, Ur: NEGATIVE

## 2018-06-11 LAB — TSH: TSH: 1.088 u[IU]/mL (ref 0.350–4.500)

## 2018-06-11 MED ORDER — FLUOXETINE HCL 20 MG PO CAPS
20.0000 mg | ORAL_CAPSULE | Freq: Every day | ORAL | Status: DC
  Administered 2018-06-12 – 2018-06-16 (×5): 20 mg via ORAL
  Filled 2018-06-11 (×7): qty 1

## 2018-06-11 MED ORDER — QUETIAPINE FUMARATE 50 MG PO TABS
150.0000 mg | ORAL_TABLET | Freq: Every day | ORAL | Status: DC
  Administered 2018-06-11: 22:00:00 150 mg via ORAL
  Filled 2018-06-11 (×2): qty 3

## 2018-06-11 NOTE — Plan of Care (Signed)
  Problem: Education: Goal: Knowledge of [place] [facility] Education information/materials will improve Outcome: Progressing   Problem: Activity: Goal: Interest or engagement in activities will improve Outcome: Progressing   Problem: Health Behavior/Discharge Planning: Goal: Compliance with treatment plan for underlying cause of condition will improve Outcome: Progressing   Problem: Safety: Goal: Periods of time without injury will increase Outcome: Progressing   

## 2018-06-11 NOTE — Progress Notes (Signed)
[facility] MD Progress Note  06/11/2018 10:55 AM Renee [name]  MRN:  [id number] Subjective: Patient reports "I am okay".  Denies feeling depressed and states her mood is "okay".  Denies medication side effects.  Denies hallucinations.  Today expresses concern about being pregnant, although does not endorse amenorrhea.  Objective : I have discussed case with treatment team and have met with patient.  23 year old female, no children, lives with her mother.  Currently unemployed.  Presented to hospital voluntarily for "medication adjustment".  Presented as fair historian with vague description of symptoms, stating she has been off her psychiatric medications prior to admission and describing some depression and neurovegetative symptoms.  She was noted to be internally preoccupied, responding to internal stimuli, but denied auditory hallucinations.  Collateral information from mother indicated that patient has been noncompliant with medications, neglecting ADLs, not sleeping well, internally preoccupied, with mood instability and episodes of agitation.  Patient has a history of prior psychiatric admission, and has been diagnosed with schizoaffective disorder in the past.  Patient continues to present with partial but noticeable improvement compared to her admission presentation.  Her overall relatedness has improved with improved eye contact, improving speech.  She does continue to have episodes of internal preoccupation and is noted to whisper to self at times and appear distracted but to a lesser degree than on admission.  She denies hallucinations and does not currently express any delusions.  ADLs have tended to improve although grooming remains fair. She denies medication side effects.  As per nursing reports patient is polite and calm on approach, limited participation in milieu, tends to spend most time in her room.  Patient denies any depression or significant neurovegetative symptoms.  Denies suicidal  ideations.  Today expresses a concern that she might be pregnant, although does not endorse amenorrhea.  A urine pregnancy test from 5/13 was reported negative.  Patient reported feeling reassured by this information -stated" I guess I am not then".  Principal Problem: Schizoaffective disorder ([place]) Diagnosis: Principal Problem:   Schizoaffective disorder ([place]) Active Problems:   Substance-induced psychotic disorder ([place])   MDD (major depressive disorder)  Total Time spent with patient: 20 minutes  Past Psychiatric History:   Past Medical History:  Past Medical History:  Diagnosis Date  . Anxiety   . Asthma    History reviewed. No pertinent surgical history. Family History: History reviewed. No pertinent family history. Family Psychiatric  History:  Social History:  Social History   Substance and Sexual Activity  Alcohol Use No     Social History   Substance and Sexual Activity  Drug Use No    Social History   Socioeconomic History  . Marital status: Single    Spouse name: Not on file  . Number of children: Not on file  . Years of education: Not on file  . Highest education level: Not on file  Occupational History  . Not on file  Social Needs  . Financial resource strain: Not on file  . Food insecurity:    Worry: Not on file    Inability: Not on file  . Transportation needs:    Medical: Not on file    Non-medical: Not on file  Tobacco Use  . Smoking status: Never Smoker  . Smokeless tobacco: Never Used  Substance and Sexual Activity  . Alcohol use: No  . Drug use: No  . Sexual activity: Never  Lifestyle  . Physical activity:    Days per week: Not on file  Minutes per session: Not on file  . Stress: Not on file  Relationships  . Social connections:    Talks on phone: Not on file    Gets together: Not on file    Attends religious service: Not on file    Active member of club or organization: Not on file    Attends meetings of clubs or  organizations: Not on file    Relationship status: Not on file  Other Topics Concern  . Not on file  Social History Narrative  . Not on file   Additional Social History:    Pain Medications: See MAR Prescriptions: See MAR Over the Counter: See MAR History of alcohol / drug use?: Yes Longest period of sobriety (when/[name] long): unknown Name of Substance 1: marijuana 1 - Age of First Use: unknown 1 - Amount (size/oz): unknown 1 - Frequency: occasional 1 - Duration: unknown 1 - Last Use / Amount: last used on her birthday, 05/03/2016  Sleep: Improving  Appetite:  Improving  Current Medications: Current Facility-Administered Medications  Medication Dose Route Frequency Provider Last Rate Last Dose  . acetaminophen (TYLENOL) tablet 650 mg  650 mg Oral Q6H PRN Mordecai Maes, NP   650 mg at 06/10/18 2018  . alum & mag hydroxide-simeth (MAALOX/MYLANTA) 200-200-20 MG/5ML suspension 30 mL  30 mL Oral Q4H PRN Mordecai Maes, NP      . FLUoxetine (PROZAC) capsule 10 mg  10 mg Oral Daily Connye Burkitt, NP   10 mg at 06/11/18 8119  . hydrOXYzine (ATARAX/VISTARIL) tablet 25 mg  25 mg Oral TID PRN Connye Burkitt, NP   25 mg at 06/09/18 2351  . OLANZapine zydis (ZYPREXA) disintegrating tablet 5 mg  5 mg Oral Q8H PRN Connye Burkitt, NP   5 mg at 06/10/18 2018   And  . ziprasidone (GEODON) injection 20 mg  20 mg Intramuscular PRN Connye Burkitt, NP      . QUEtiapine (SEROQUEL) tablet 100 mg  100 mg Oral QHS Cobos, Myer Peer, MD   100 mg at 06/10/18 2018  . QUEtiapine (SEROQUEL) tablet 25 mg  25 mg Oral Daily Connye Burkitt, NP   25 mg at 06/11/18 1478  . traZODone (DESYREL) tablet 50 mg  50 mg Oral QHS PRN Connye Burkitt, NP   50 mg at 06/10/18 2018    Lab Results:  Results for orders placed or performed during the hospital encounter of 06/09/18 (from the past 48 hour(s))  Urine rapid drug screen (hosp performed)not at [facility]     Status: Abnormal   Collection Time: 06/09/18  1:51 PM   Result Value Ref Range   Opiates NONE DETECTED NONE DETECTED   Cocaine NONE DETECTED NONE DETECTED   Benzodiazepines NONE DETECTED NONE DETECTED   Amphetamines NONE DETECTED NONE DETECTED   Tetrahydrocannabinol POSITIVE (A) NONE DETECTED   Barbiturates NONE DETECTED NONE DETECTED    Comment: (NOTE) DRUG SCREEN FOR MEDICAL PURPOSES ONLY.  IF CONFIRMATION IS NEEDED FOR ANY PURPOSE, NOTIFY LAB WITHIN 5 DAYS. LOWEST DETECTABLE LIMITS FOR URINE DRUG SCREEN Drug Class                     Cutoff (ng/mL) Amphetamine and metabolites    1000 Barbiturate and metabolites    200 Benzodiazepine                 295 Tricyclics and metabolites     300 Opiates and metabolites  300 Cocaine and metabolites        300 THC                            50 Performed at [facility], [place] [address], [place], [place] [address]   Pregnancy, urine     Status: None   Collection Time: 06/10/18  3:34 PM  Result Value Ref Range   Preg Test, Ur NEGATIVE NEGATIVE    Comment:        THE SENSITIVITY OF THIS METHODOLOGY IS >20 mIU/mL. Performed at [facility], [place] [address], [place], [place] [address]   Urinalysis, Routine w reflex microscopic     Status: Abnormal   Collection Time: 06/10/18  3:34 PM  Result Value Ref Range   Color, Urine AMBER (A) YELLOW    Comment: BIOCHEMICALS MAY BE AFFECTED BY COLOR   APPearance CLOUDY (A) CLEAR   Specific Gravity, Urine 1.029 1.005 - 1.030   pH 6.0 5.0 - 8.0   Glucose, UA NEGATIVE NEGATIVE mg/dL   Hgb urine dipstick LARGE (A) NEGATIVE   Bilirubin Urine NEGATIVE NEGATIVE   Ketones, ur NEGATIVE NEGATIVE mg/dL   Protein, ur 30 (A) NEGATIVE mg/dL   Nitrite NEGATIVE NEGATIVE   Leukocytes,Ua TRACE (A) NEGATIVE   RBC / HPF 11-20 0 - 5 RBC/hpf   WBC, UA 6-10 0 - 5 WBC/hpf   Bacteria, UA FEW (A) NONE SEEN   Squamous Epithelial / LPF 0-5 0 - 5   Mucus PRESENT    Ca Oxalate Crys, UA PRESENT     Comment: Performed at [facility], [place] [address] [place],  [address]    Blood Alcohol level:  Lab Results  Component Value Date   ETH <10 08/27/2017   ETH <10 00/93/8182    Metabolic Disorder Labs: Lab Results  Component Value Date   HGBA1C 4.8 08/29/2017   MPG 91.06 08/29/2017   Lab Results  Component Value Date   PROLACTIN 93.0 (H) 08/29/2017   Lab Results  Component Value Date   CHOL 201 (H) 08/29/2017   TRIG 93 08/29/2017   HDL 47 08/29/2017   CHOLHDL 4.3 08/29/2017   VLDL 19 08/29/2017   LDLCALC 135 (H) 08/29/2017   LDLCALC 117 (H) 10/26/2010    Physical Findings: AIMS: Facial and Oral Movements Muscles of Facial Expression: None, normal Lips and Perioral Area: None, normal Jaw: None, normal Tongue: None, normal,Extremity Movements Upper (arms, wrists, hands, fingers): None, normal Lower (legs, knees, ankles, toes): None, normal, Trunk Movements Neck, shoulders, hips: None, normal, Overall Severity Severity of abnormal movements (highest score from questions above): None, normal Incapacitation due to abnormal movements: None, normal Patient's awareness of abnormal movements (rate only patient's report): No Awareness, Dental Status Current problems with teeth and/or dentures?: No Does patient usually wear dentures?: No  CIWA:    COWS:     Musculoskeletal: Strength & Muscle Tone: within normal limits no restlessness or agitation Gait & Station: normal Patient leans: N/A  Psychiatric Specialty Exam: Physical Exam  ROS denies chest pain, denies shortness of breath, denies cough, denies nausea or vomiting  Blood pressure 139/88, pulse (!) 113, temperature 99.1 F (37.3 C), temperature source Oral, resp. rate 18, height _0  (1.575 m), weight 97.1 kg, SpO2 99 %.Body mass index is 39.14 kg/m.  General Appearance: Improving grooming  Eye Contact:  Improving eye contact  Speech:  Normal Rate  Volume:  Decreased  Mood:  Denies depression, states her mood is  "okay", affect is blunted at times although does smile politely on approach.  Less inappropriate affect noted today  Affect:  Improving/see above  Thought Process: Becoming more linear although still concrete.   Orientation:  Other:  Fully alert and attentive  Thought Content:  Denies hallucinations.  Presents internally preoccupied but to a lesser degree than on admission.  Does not express delusional ideations at this time  Suicidal Thoughts:  No denies suicidal or self-injurious ideations, denies homicidal or violent ideations, contracts for safety on unit  Homicidal Thoughts:  No  Memory:  Recent and remote fair  Judgement:  Fair/improving  Insight:  Fair  Psychomotor Activity:  Normal-no agitation or restlessness  Concentration:  Concentration: Improving and Attention Span: Improving  Recall:  [employer] of Knowledge:  Fair  Language:  Fair  Akathisia:  Negative  Handed:  Right  AIMS (if indicated):     Assets:  Desire for Improvement Resilience  ADL's: Fair  Cognition:  WNL  Sleep:  Number of Hours: 6.75   Assessment 23 year old female, no children, lives with her mother.  Currently unemployed.  Presented to hospital voluntarily for "medication adjustment".  Presented as fair historian with vague description of symptoms, stating she has been off her psychiatric medications prior to admission and describing some depression and neurovegetative symptoms.  She was noted to be internally preoccupied, responding to internal stimuli, but denied auditory hallucinations.  Collateral information from mother indicated that patient has been noncompliant with medications, neglecting ADLs, not sleeping well, internally preoccupied, with mood instability and episodes of agitation.  Patient has a history of prior psychiatric admission, and has been diagnosed with schizoaffective disorder in the past.   Patient presents with partial improvement compared to admission.  Improvement is noted with a  partially/gradually improved ADLs/grooming, a tendency towards better eye contact, improved verbal communication, less visible internal preoccupation.  She does, however, continue to present internally preoccupied at times and some inappropriate affect still noted although to a lesser degree.  Today expressed concern about being pregnant although did not elaborate further and denied amenorrhea.  Admission urine pregnancy test was negative.  Thus far is tolerating Seroquel/Prozac combination well.  Denies side effects.  We have discussed side effect profile to include potential for weight gain/sedation/motor disturbances/metabolic disturbances.  Treatment Plan Summary: Daily contact with patient to assess and evaluate symptoms and progress in treatment, Medication management, Plan Inpatient treatment and Medications as below Encourage group/milieu participation to work on coping skills and symptom reduction Increase Seroquel to 25 mg every morning and 150 mg nightly for mood disorder/psychosis Increase Prozac to 20  mg daily for mood disorder Continue Vistaril 25 mg every 8 hours PRN for anxiety Continue Trazodone 50 mg nightly PRN for insomnia Continue agitation protocol ( Zyprexa /Ativan PO / Geodon IM) for acute psychotic agitation as needed Routine labs not done, patient states she is unsure why. Have encouraged her to allow bloodwork for routine tests . At this time states she agrees .   Jenne Campus, MD 06/11/2018, 10:55 AMPatient ID: Renee [name], female   DOB: 14-Nov-1995, 23 y.o.   MRN: [id number]

## 2018-06-11 NOTE — Progress Notes (Signed)
Patient ID: Renee [name], female   DOB: 01/15/1996, 23 y.o.   MRN: [id number]  Nursing Progress Note [id number]  On initial approach, patient is seen in her room. Patient presents pleasant this morning but remains internally preoccupied. Patient compliant with scheduled medications. Patient is isolative to her room. Patient currently denies SI/HI. Patient declined to complete self-inventory sheet and denies concerns at this time.  Patient is educated about and provided medication per provider's orders. Patient safety maintained with q15 min safety checks and low fall risk precautions. Emotional support given, 1:1 interaction, and active listening provided. Patient encouraged to attend meals, groups, and work on treatment plan and goals. Labs, vital signs and patient behavior monitored throughout shift.   Patient contracts for safety with staff. Patient remains safe on the unit at this time and agrees to come to staff with any issues/concerns. Patient is interacting with peers appropriately on the unit. Will continue to support and monitor.  [place] NOVEL CORONAVIRUS (COVID-19) DAILY CHECK-OFF SYMPTOMS - answer yes or no to each - every day NO YES  Have you had a fever in the past 24 hours?  . Fever (Temp > 37.80C / 100F) X   Have you had any of these symptoms in the past 24 hours? . New Cough .  Sore Throat  .  Shortness of Breath .  Difficulty Breathing .  Unexplained Body Aches   X   Have you had any one of these symptoms in the past 24 hours not related to allergies?   . Runny Nose .  Nasal Congestion .  Sneezing   X   If you have had runny nose, nasal congestion, sneezing in the past 24 hours, has it worsened?  X   EXPOSURES - check yes or no X   Have you traveled outside the state in the past 14 days?  X   Have you been in contact with someone with a confirmed diagnosis of COVID-19 or PUI in the past 14 days without wearing appropriate PPE?  X   Have you been living in  the same home as a person with confirmed diagnosis of COVID-19 or a PUI (household contact)?    X   Have you been diagnosed with COVID-19?    X              What to do next: Answered NO to all: Answered YES to anything:   Proceed with unit schedule Follow the [facility] Inpatient Flowsheet.

## 2018-06-11 NOTE — Plan of Care (Signed)
D: Patient is alert and cooperative. Denies SI, HI, AVH, and verbally contracts for safety. Though patient denies AVH she is clearly responding to internal stimuli. She is talking and laughing to herself. Patient denies physical symptoms/pain. Interaction is minimal.   A: Medications administered per MD order. Support provided. Patient educated on safety on the unit and medications. Routine safety checks every 15 minutes. Patient stated understanding to tell nurse about any new physical symptoms. Patient understands to tell staff of any needs.     R: No adverse drug reactions noted. Patient verbally contracts for safety. Patient remains safe at this time and will continue to monitor.   Problem: Safety: Goal: Periods of time without injury will increase Outcome: Progressing   Patient remains safe and will continue to monitor.   [place] NOVEL CORONAVIRUS (COVID-19) DAILY CHECK-OFF SYMPTOMS - answer yes or no to each - every day NO YES  Have you had a fever in the past 24 hours?  Fever (Temp > 37.80C / 100F) X   Have you had any of these symptoms in the past 24 hours? New Cough  Sore Throat   Shortness of Breath  Difficulty Breathing  Unexplained Body Aches   X   Have you had any one of these symptoms in the past 24 hours not related to allergies?   Runny Nose  Nasal Congestion  Sneezing   X   If you have had runny nose, nasal congestion, sneezing in the past 24 hours, has it worsened?  X   EXPOSURES - check yes or no X   Have you traveled outside the state in the past 14 days?  X   Have you been in contact with someone with a confirmed diagnosis of COVID-19 or PUI in the past 14 days without wearing appropriate PPE?  X   Have you been living in the same home as a person with confirmed diagnosis of COVID-19 or a PUI (household contact)?    X   Have you been diagnosed with COVID-19?    X              What to do next: Answered NO to all: Answered YES to anything:   Proceed  with unit schedule Follow the [facility] Inpatient Flowsheet.

## 2018-06-11 NOTE — [facility] Suicide Risk Assessment (Deleted)
[place] Discharge Suicide Risk Assessment   Principal Problem: Schizoaffective disorder [facility]) Discharge Diagnoses: Principal Problem:   Schizoaffective disorder (H[facility]) Active Problems:   Substance-induced psychotic disorder ([facility])   MDD (major depressive disorder)   Total Time spent with patient: 45 minutes  Musculoskeletal: Strength & Muscle Tone: within normal limits Gait & Station: normal Patient leans: N/A  Psychiatric Specialty Exam: ROS  Blood pressure 139/88, pulse (!) 113, temperature 99.1 F (37.3 C), temperature source Oral, resp. rate 18, height 5\' 2"  (1.575 m), weight 97.1 kg, SpO2 99 %.Body mass index is 39.14 kg/m.  General Appearance: Casual  Eye Contact::  Good  Speech:  Pressured409  Volume:  Normal  Mood:  Dysphoric  Affect:  Congruent  Thought Process:  Coherent  Orientation:  Full (Time, Place, and Person)  Thought Content:  Logical  Suicidal Thoughts:  No  Homicidal Thoughts:  No  Memory:  Immediate;   Good  Judgement:  Good  Insight:  Good  Psychomotor Activity:  Normal  Concentration:  Fair  Recall:  Good  Fund of Knowledge:Good  Language: Good  Akathisia:  Negative  Handed:  Right  AIMS (if indicated):     Assets:  Communication Skills Desire for Improvement  Sleep:  Number of Hours: 6.75  Cognition: WNL  ADL's:  Intact   Mental Status Per Nursing Assessment::   On Admission:  NA  Demographic Factors:  NA  Loss Factors: Loss of significant relationship  Historical Factors: NA  Risk Reduction Factors:   Sense of responsibility to family, Religious beliefs about death, Employed, Living with another person, especially a relative and Positive social support  Continued Clinical Symptoms:  Dysthymia  Cognitive Features That Contribute To Risk:  None    Suicide Risk:  Minimal: No identifiable suicidal ideation.  Patients presenting with no risk factors but with morbid ruminations; may be classified as minimal risk based on the  severity of the depressive symptoms    Plan Of Care/Follow-up recommendations:  Activity:  full  Thressa Shiffer, MD 06/11/2018, 3:28 PM

## 2018-06-12 MED ORDER — POTASSIUM CHLORIDE CRYS ER 20 MEQ PO TBCR
20.0000 meq | EXTENDED_RELEASE_TABLET | Freq: Two times a day (BID) | ORAL | Status: AC
  Administered 2018-06-12 – 2018-06-13 (×3): 20 meq via ORAL
  Filled 2018-06-12 (×4): qty 1

## 2018-06-12 MED ORDER — RISPERIDONE 1 MG PO TABS
1.0000 mg | ORAL_TABLET | Freq: Two times a day (BID) | ORAL | Status: DC
  Administered 2018-06-12 – 2018-06-13 (×3): 1 mg via ORAL
  Filled 2018-06-12 (×6): qty 1

## 2018-06-12 NOTE — Progress Notes (Signed)
Recreation Therapy Notes  Date:  5.15.20 Time: 0930 Location: [address] Dayroom  Group Topic: Stress Management  Goal Area(s) Addresses:  Patient will identify positive stress management techniques. Patient will identify benefits of using stress management post d/c.  Intervention: Stress Management  Activity :  Guided Imagery.  LRT introduced the stress management technique of guided imagery.  LRT read a script that lead patients on a walk along the beach.  Patients were to listen and follow along to engage in activity.  Education:  Stress Management, Discharge Planning.   Education Outcome: Acknowledges Education  Clinical Observations/Feedback: Pt did not attend group.    Yen Wandell, LRT/CTRS        Doretta Remmert A 06/12/2018 11:49 AM 

## 2018-06-12 NOTE — Progress Notes (Signed)
[facility] MD Progress Note  06/12/2018 11:58 AM Renee [name]  MRN:  [id number] Subjective: Patient states "I need time to myself".  Expresses belief that she is pregnant with twins.  Denies medication side effects. Objective : I have discussed case with treatment team and have met with patient.  23 year old female, no children, lives with her mother.  Currently unemployed.  Presented to hospital voluntarily for "medication adjustment".  Presented as fair historian with vague description of symptoms, stating she has been off her psychiatric medications prior to admission and describing some depression and neurovegetative symptoms.  She was noted to be internally preoccupied, responding to internal stimuli, but denied auditory hallucinations.  Collateral information from mother indicated that patient has been noncompliant with medications, neglecting ADLs, not sleeping well, internally preoccupied, with mood instability and episodes of agitation.  Patient has a history of prior psychiatric admission, and has been diagnosed with schizoaffective disorder in the past.  Today patient seems more symptomatic/worse than she did yesterday.  She appears internally preoccupied, frequently whispering unintelligibly under her breath, with decreased eye contact and some vague irritability.  She denies hallucinations.  As above, she is not expressing belief that she is pregnant "with twins".  States "I just want to give birth and then go home" She denies suicidal ideations.  Describes mood as "all right", but presents with a blunted and irritable affect today.  At this time denies medication side effects.  As reviewed with nursing staff, she has presented internally preoccupied, laughing to self.  Has taken medications only with significant encouragement from staff.  She did allow blood work to be done this morning-labs reviewed.  BMP remarkable for hypokalemia at 2.9, BUN 9, creatinine 0.67, CBC unremarkable,  hemoglobin A1c 5.1, TSH 1.088 5/13 urine pregnancy test negative, 5/14 beta hCG less than 1. No overtly agitated or disruptive behaviors, limited group/milieu participation.  Principal Problem: Schizoaffective disorder ([place]) Diagnosis: Principal Problem:   Schizoaffective disorder ([place]) Active Problems:   Substance-induced psychotic disorder ([place])   MDD (major depressive disorder)  Total Time spent with patient: 20 minutes  Past Psychiatric History:   Past Medical History:  Past Medical History:  Diagnosis Date  . Anxiety   . Asthma    History reviewed. No pertinent surgical history. Family History: History reviewed. No pertinent family history. Family Psychiatric  History:  Social History:  Social History   Substance and Sexual Activity  Alcohol Use No     Social History   Substance and Sexual Activity  Drug Use No    Social History   Socioeconomic History  . Marital status: Single    Spouse name: Not on file  . Number of children: Not on file  . Years of education: Not on file  . Highest education level: Not on file  Occupational History  . Not on file  Social Needs  . Financial resource strain: Not on file  . Food insecurity:    Worry: Not on file    Inability: Not on file  . Transportation needs:    Medical: Not on file    Non-medical: Not on file  Tobacco Use  . Smoking status: Never Smoker  . Smokeless tobacco: Never Used  Substance and Sexual Activity  . Alcohol use: No  . Drug use: No  . Sexual activity: Never  Lifestyle  . Physical activity:    Days per week: Not on file    Minutes per session: Not on file  . Stress: Not on file  Relationships  . Social connections:    Talks on phone: Not on file    Gets together: Not on file    Attends religious service: Not on file    Active member of club or organization: Not on file    Attends meetings of clubs or organizations: Not on file    Relationship status: Not on file  Other Topics Concern   . Not on file  Social History Narrative  . Not on file   Additional Social History:    Pain Medications: See MAR Prescriptions: See MAR Over the Counter: See MAR History of alcohol / drug use?: Yes Longest period of sobriety (when/how long): unknown Name of Substance 1: marijuana 1 - Age of First Use: unknown 1 - Amount (size/oz): unknown 1 - Frequency: occasional 1 - Duration: unknown 1 - Last Use / Amount: last used on her birthday, 05/03/2016  Sleep: Fair  Appetite:  Fair  Current Medications: Current Facility-Administered Medications  Medication Dose Route Frequency Provider Last Rate Last Dose  . acetaminophen (TYLENOL) tablet 650 mg  650 mg Oral Q6H PRN Mordecai Maes, NP   650 mg at 06/10/18 2018  . alum & mag hydroxide-simeth (MAALOX/MYLANTA) 200-200-20 MG/5ML suspension 30 mL  30 mL Oral Q4H PRN Mordecai Maes, NP      . FLUoxetine (PROZAC) capsule 20 mg  20 mg Oral Daily Chenoah Mcnally, Myer Peer, MD   20 mg at 06/12/18 8325  . hydrOXYzine (ATARAX/VISTARIL) tablet 25 mg  25 mg Oral TID PRN Connye Burkitt, NP   25 mg at 06/11/18 2158  . OLANZapine zydis (ZYPREXA) disintegrating tablet 5 mg  5 mg Oral Q8H PRN Connye Burkitt, NP   5 mg at 06/12/18 1034   And  . ziprasidone (GEODON) injection 20 mg  20 mg Intramuscular PRN Connye Burkitt, NP      . potassium chloride SA (K-DUR) CR tablet 20 mEq  20 mEq Oral BID Neyah Ellerman, Myer Peer, MD   20 mEq at 06/12/18 0955  . risperiDONE (RISPERDAL) tablet 1 mg  1 mg Oral BID Mateen Franssen, Myer Peer, MD   1 mg at 06/12/18 1155  . traZODone (DESYREL) tablet 50 mg  50 mg Oral QHS PRN Connye Burkitt, NP   50 mg at 06/11/18 2159    Lab Results:  Results for orders placed or performed during the hospital encounter of 06/09/18 (from the past 48 hour(s))  Pregnancy, urine     Status: None   Collection Time: 06/10/18  3:34 PM  Result Value Ref Range   Preg Test, Ur NEGATIVE NEGATIVE    Comment:        THE SENSITIVITY OF THIS METHODOLOGY IS >20  mIU/mL. Performed at [facility], [place] [address], [place], [place] [address]   Urinalysis, Routine w reflex microscopic     Status: Abnormal   Collection Time: 06/10/18  3:34 PM  Result Value Ref Range   Color, Urine AMBER (A) YELLOW    Comment: BIOCHEMICALS MAY BE AFFECTED BY COLOR   APPearance CLOUDY (A) CLEAR   Specific Gravity, Urine 1.029 1.005 - 1.030   pH 6.0 5.0 - 8.0   Glucose, UA NEGATIVE NEGATIVE mg/dL   Hgb urine dipstick LARGE (A) NEGATIVE   Bilirubin Urine NEGATIVE NEGATIVE   Ketones, ur NEGATIVE NEGATIVE mg/dL   Protein, ur 30 (A) NEGATIVE mg/dL   Nitrite NEGATIVE NEGATIVE   Leukocytes,Ua TRACE (A) NEGATIVE   RBC / HPF 11-20 0 - 5 RBC/hpf  WBC, UA 6-10 0 - 5 WBC/hpf   Bacteria, UA FEW (A) NONE SEEN   Squamous Epithelial / LPF 0-5 0 - 5   Mucus PRESENT    Ca Oxalate Crys, UA PRESENT     Comment: Performed at [facility], [place] [address] [place], [place] [address]  Comprehensive metabolic panel     Status: Abnormal   Collection Time: 06/11/18  6:19 PM  Result Value Ref Range   Sodium 138 135 - 145 mmol/L   Potassium 2.9 (L) 3.5 - 5.1 mmol/L   Chloride 105 98 - 111 mmol/L   CO2 26 22 - 32 mmol/L   Glucose, Bld 103 (H) 70 - 99 mg/dL   BUN 9 6 - 20 mg/dL   Creatinine, Ser 0.67 0.44 - 1.00 mg/dL   Calcium 8.9 8.9 - 10.3 mg/dL   Total Protein 8.0 6.5 - 8.1 g/dL   Albumin 3.6 3.5 - 5.0 g/dL   AST 22 15 - 41 U/L   ALT 21 0 - 44 U/L   Alkaline Phosphatase 77 38 - 126 U/L   Total Bilirubin 0.4 0.3 - 1.2 mg/dL   GFR calc non Af Amer >60 >60 mL/min   GFR calc Af Amer >60 >60 mL/min   Anion gap 7 5 - 15    Comment: Performed at [facility], [place] [address], [place], [place] [address]  CBC with Differential/Platelet     Status: None   Collection Time: 06/11/18  6:19 PM  Result Value Ref Range   WBC 8.8 4.0 - 10.5 K/uL   RBC 4.21 3.87 - 5.11 MIL/uL   Hemoglobin 12.1 12.0 - 15.0 g/dL   HCT 36.4 36.0 - 46.0 %    MCV 86.5 80.0 - 100.0 fL   MCH 28.7 26.0 - 34.0 pg   MCHC 33.2 30.0 - 36.0 g/dL   RDW 12.3 11.5 - 15.5 %   Platelets 313 150 - 400 K/uL   nRBC 0.0 0.0 - 0.2 %   Neutrophils Relative % 62 %   Neutro Abs 5.4 1.7 - 7.7 K/uL   Lymphocytes Relative 30 %   Lymphs Abs 2.6 0.7 - 4.0 K/uL   Monocytes Relative 7 %   Monocytes Absolute 0.7 0.1 - 1.0 K/uL   Eosinophils Relative 1 %   Eosinophils Absolute 0.0 0.0 - 0.5 K/uL   Basophils Relative 0 %   Basophils Absolute 0.0 0.0 - 0.1 K/uL   Immature Granulocytes 0 %   Abs Immature Granulocytes 0.03 0.00 - 0.07 K/uL    Comment: Performed at [facility], [place] [address], [place], [place] [address]  hCG, quantitative, pregnancy     Status: None   Collection Time: 06/11/18  6:19 PM  Result Value Ref Range   hCG, Beta Chain, Quant, S <1 <5 mIU/mL    Comment:          GEST. AGE      CONC.  (mIU/mL)   <=1 WEEK        5 - 50     2 WEEKS       50 - 500     3 WEEKS       100 - 10,000     4 WEEKS     1,000 - 30,000     5 WEEKS     3,500 - 115,000   6-8 WEEKS     12,000 - 270,000    12 WEEKS  15,000 - 220,000        FEMALE AND NON-PREGNANT FEMALE:     LESS THAN 5 mIU/mL Performed at [facility], [place] [address] [place], [place] [address]   TSH     Status: None   Collection Time: 06/11/18  6:19 PM  Result Value Ref Range   TSH 1.088 0.350 - 4.500 uIU/mL    Comment: Performed by a 3rd Generation assay with a functional sensitivity of <=0.01 uIU/mL. Performed at [facility], [place] [address] [place], [place] [address]   Hemoglobin A1c     Status: None   Collection Time: 06/11/18  6:19 PM  Result Value Ref Range   Hgb A1c MFr Bld 5.1 4.8 - 5.6 %    Comment: (NOTE) Pre diabetes:          5.7%-6.4% Diabetes:              >6.4% Glycemic control for   <7.0% adults with diabetes    Mean Plasma Glucose 99.67 mg/dL    Comment: Performed at [place] [address],  [place], [place] [address]  Lipid panel     Status: Abnormal   Collection Time: 06/11/18  6:19 PM  Result Value Ref Range   Cholesterol 158 0 - 200 mg/dL   Triglycerides 54 <150 mg/dL   HDL 35 (L) >40 mg/dL   Total CHOL/HDL Ratio 4.5 RATIO   VLDL 11 0 - 40 mg/dL   LDL Cholesterol 112 (H) 0 - 99 mg/dL    Comment:        Total Cholesterol/HDL:CHD Risk Coronary Heart Disease Risk Table                     Men   Women  1/2 Average Risk   3.4   3.3  Average Risk       5.0   4.4  2 X Average Risk   9.6   7.1  3 X Average Risk  23.4   11.0        Use the calculated Patient Ratio above and the CHD Risk Table to determine the patient's CHD Risk.        ATP III CLASSIFICATION (LDL):  <100     mg/dL   Optimal  100-129  mg/dL   Near or Above                    Optimal  130-159  mg/dL   Borderline  160-189  mg/dL   High  >190     mg/dL   Very High Performed at [place] [address], [place], [place] [address]     Blood Alcohol level:  Lab Results  Component Value Date   ETH <10 08/27/2017   ETH <10 58/52/7782    Metabolic Disorder Labs: Lab Results  Component Value Date   HGBA1C 5.1 06/11/2018   MPG 99.67 06/11/2018   MPG 91.06 08/29/2017   Lab Results  Component Value Date   PROLACTIN 93.0 (H) 08/29/2017   Lab Results  Component Value Date   CHOL 158 06/11/2018   TRIG 54 06/11/2018   HDL 35 (L) 06/11/2018   CHOLHDL 4.5 06/11/2018   VLDL 11 06/11/2018   LDLCALC 112 (H) 06/11/2018   LDLCALC 135 (H) 08/29/2017    Physical Findings: AIMS: Facial and Oral Movements Muscles of Facial Expression: None, normal Lips and Perioral Area: None, normal Jaw: None, normal Tongue:  None, normal,Extremity Movements Upper (arms, wrists, hands, fingers): None, normal Lower (legs, knees, ankles, toes): None, normal, Trunk Movements Neck, shoulders, hips: None, normal, Overall Severity Severity of abnormal movements (highest score from questions above): None,  normal Incapacitation due to abnormal movements: None, normal Patient's awareness of abnormal movements (rate only patient's report): No Awareness, Dental Status Current problems with teeth and/or dentures?: No Does patient usually wear dentures?: No  CIWA:    COWS:     Musculoskeletal: Strength & Muscle Tone: within normal limits  Gait & Station: normal Patient leans: N/A  Psychiatric Specialty Exam: Physical Exam  ROS denies chest pain, denies shortness of breath, denies cough, denies nausea or vomiting  Blood pressure (!) 114/102, pulse (!) 114, temperature 98.8 F (37.1 C), temperature source Oral, resp. rate 18, height '5\' 2"'  (1.575 m), weight 97.1 kg, SpO2 100 %.Body mass index is 39.14 kg/m.  General Appearance: Fairly Groomed and Improving grooming  Eye Contact:  Fair  Speech:  Normal Rate  Volume:  Decreased  Mood:  Denies feeling depressed, describes her mood as normal  Affect:  Today blunted, vaguely irritable  Thought Process: Becoming more linear although still concrete.   Orientation:  Other:  Fully alert and attentive  Thought Content:  Although denies hallucinations presents internally preoccupied, responding to internal stimuli.  Delusional ideation regarding pregnancy with twins  Suicidal Thoughts:  No denies suicidal or self-injurious ideations, denies homicidal or violent ideations, contracts for safety on unit  Homicidal Thoughts:  No  Memory:  Recent and remote fair  Judgement:  Fair  Insight:  Fair  Psychomotor Activity:  Normal-no agitation or restlessness  Concentration:  Concentration: Fair and Attention Span: Fair  Recall:  [employer] of Knowledge:  Fair  Language:  Fair  Akathisia:  Negative  Handed:  Right  AIMS (if indicated):     Assets:  Desire for Improvement Resilience  ADL's: Fair  Cognition:  WNL  Sleep:  Number of Hours: 2.75   Assessment 23 year old female, no children, lives with her mother.  Currently unemployed.  Presented to  hospital voluntarily for "medication adjustment".  Presented as fair historian with vague description of symptoms, stating she has been off her psychiatric medications prior to admission and describing some depression and neurovegetative symptoms.  She was noted to be internally preoccupied, responding to internal stimuli, but denied auditory hallucinations.  Collateral information from mother indicated that patient has been noncompliant with medications, neglecting ADLs, not sleeping well, internally preoccupied, with mood instability and episodes of agitation.  Patient has a history of prior psychiatric admission, and has been diagnosed with schizoaffective disorder in the past.   Patient presents with increased psychotic symptoms today.  Yesterday she appeared to be improving but today she presents more symptomatic, with a blunted/irritable affect and persistent internal preoccupation/responding to internal stimuli.  She is expressing delusional ideation that she is pregnant with twins.  Her hCG is negative.  Labs are remarkable for hypokalemia.  She has been tolerating Seroquel titration well thus far, but has noted symptoms are worsening in spite of increased dosing. We have reviewed other options, will switch to Risperidone. ( 5/12 EKG QTc 430)   Treatment Plan Summary: Daily contact with patient to assess and evaluate symptoms and progress in treatment, Medication management, Plan Inpatient treatment and Medications as below  Treatment plan reviewed as below today 5/15 Encourage group/milieu participation to work on coping skills and symptom reduction Discontinue Seroquel Continue Prozac  20  mg daily for  mood disorder Start Risperidone 1 mg twice daily for psychosis-titrate gradually as tolerated.  Start potassium supplementation/K. Dur 20 mEq twice daily x3  doses Continue Trazodone 50 mg nightly PRN for insomnia Continue agitation protocol ( Zyprexa Francee Gentile PO / Geodon IM) for acute  psychotic agitation as needed   Jenne Campus, MD 06/12/2018, 11:58 AM   Patient ID: Jeannette How, female   DOB: Sep 29, 1995, 23 y.o.   MRN: [id number]

## 2018-06-12 NOTE — Progress Notes (Signed)
Psychoeducational Group Note  Date:  06/12/2018 Time:  1048  Group Topic/Focus:  Recovery Goals:   The focus of this group is to identify appropriate goals for recovery and establish a plan to achieve them.  Participation Level: Did Not Attend  Participation Quality:  Not Applicable  Affect:  Not Applicable  Cognitive:  Not Applicable  Insight:  Not Applicable  Engagement in Group: Not Applicable  Additional Comments:  Pt refused to attend the group session this morning.  Jackalyn Haith E 06/12/2018, 11:05 AM

## 2018-06-12 NOTE — Progress Notes (Signed)
D:Pt has been constantly whispering to herself this morning. Pt was reluctant to take medications and it took much encouragement with Clinical research associate and MD for pt to take her medications. Pt verbally denies si and hi. A:Offered support, encouragement and 15 minute checks.  R:Safety maintained on the unit.

## 2018-06-13 MED ORDER — RISPERIDONE 2 MG PO TABS
2.0000 mg | ORAL_TABLET | Freq: Every day | ORAL | Status: DC
  Administered 2018-06-13 – 2018-06-14 (×2): 2 mg via ORAL
  Filled 2018-06-13 (×4): qty 1

## 2018-06-13 MED ORDER — RISPERIDONE 1 MG PO TABS
1.0000 mg | ORAL_TABLET | ORAL | Status: DC
  Administered 2018-06-14 – 2018-06-16 (×3): 1 mg via ORAL
  Filled 2018-06-13 (×5): qty 1

## 2018-06-13 NOTE — [facility] Group Notes (Signed)
[facility] LCSW Group Therapy Note  06/13/2018  10:00-11:00AM  Type of Therapy and Topic:  Group Therapy - Accepting We Are All Damaged People  Participation Level:  Did Not Attend   Description of Group:  Patients in this group were asked to share whether they feel that they are "damaged" and explain their responses.  A song entitled "Damaged People" was then played, followed by a discussion of the relevance/relatedness of this song to each patient.   The conclusion of the group was that our goal as humans does not need to be perfection, but rather growth.  Insights among group members were shared, including that it is easy to point the fingers at others as being damaged, but actually we need to realize that we also are flawed humans with problems to overcome.  The group concluded with an emphasis on how this is ultimately a message of hope that we face struggles like every other person in the world, and that we are not alone.  Therapeutic Goals: 1)  introduce the concept of pain and hardship being universal  2)  connect emotionally to a musical message and to other group members  3)  identify the patient's current beliefs about their own broken methods of resolving their life problems to date, specifically related to this hospitalization  4)  allow time and space for patients to vent their pain and receive support from other patients  5)  elicit hope that arises from realizing we are not alone in our human struggles   Summary of Patient Progress:  N/A  Therapeutic Modalities:   Motivational Interviewing Activity  Hailly Fess J Grossman-Orr  06/13/2018 8:40 AM       

## 2018-06-13 NOTE — Progress Notes (Signed)
[facility] MD Progress Note  06/13/2018 10:14 AM KALISA GIRTMAN  MRN:  [id number] Subjective: Patient describes feeling anxious.  Denies suicidal ideations.  Denies medication side effects.  Today does endorse auditory hallucinations although does not elaborate on content.   Objective : I have reviewed chart notes, have discussed case with RN, and have met with patient.  23 year old female, no children, lives with her mother.  Currently unemployed.  Presented to hospital voluntarily for "medication adjustment".  Presented as fair historian with vague description of symptoms, stating she has been off her psychiatric medications prior to admission and describing some depression and neurovegetative symptoms.  She was noted to be internally preoccupied, responding to internal stimuli, but denied auditory hallucinations.  Collateral information from mother indicated that patient has been noncompliant with medications, neglecting ADLs, not sleeping well, internally preoccupied, with mood instability and episodes of agitation.  Patient has a history of prior psychiatric admission, and has been diagnosed with schizoaffective disorder in the past.  Patient remains psychotic, with internal preoccupation, talking to herself, mumbling unintelligibly, pacing the hallway at times, appearing vaguely paranoid and anxious.  Today she does acknowledge experiencing auditory hallucinations, which she had denied thus far. She states she thinks her mother is pregnant and my want to kill her her but does not elaborate further. Denies medication side effects - tolerating Risperidone trial well thus far. Has also received Zyprexa PRNs without side effects.  Denies suicidal ideations.   Principal Problem: Schizoaffective disorder ([place]) Diagnosis: Principal Problem:   Schizoaffective disorder ([place]) Active Problems:   Substance-induced psychotic disorder ([place])   MDD (major depressive disorder)  Total Time spent with patient:  15 minutes  Past Psychiatric History:   Past Medical History:  Past Medical History:  Diagnosis Date  . Anxiety   . Asthma    History reviewed. No pertinent surgical history. Family History: History reviewed. No pertinent family history. Family Psychiatric  History:  Social History:  Social History   Substance and Sexual Activity  Alcohol Use No     Social History   Substance and Sexual Activity  Drug Use No    Social History   Socioeconomic History  . Marital status: Single    Spouse name: Not on file  . Number of children: Not on file  . Years of education: Not on file  . Highest education level: Not on file  Occupational History  . Not on file  Social Needs  . Financial resource strain: Not on file  . Food insecurity:    Worry: Not on file    Inability: Not on file  . Transportation needs:    Medical: Not on file    Non-medical: Not on file  Tobacco Use  . Smoking status: Never Smoker  . Smokeless tobacco: Never Used  Substance and Sexual Activity  . Alcohol use: No  . Drug use: No  . Sexual activity: Never  Lifestyle  . Physical activity:    Days per week: Not on file    Minutes per session: Not on file  . Stress: Not on file  Relationships  . Social connections:    Talks on phone: Not on file    Gets together: Not on file    Attends religious service: Not on file    Active member of club or organization: Not on file    Attends meetings of clubs or organizations: Not on file    Relationship status: Not on file  Other Topics Concern  . Not  on file  Social History Narrative  . Not on file   Additional Social History:    Pain Medications: See MAR Prescriptions: See MAR Over the Counter: See MAR History of alcohol / drug use?: Yes Longest period of sobriety (when/how long): unknown Name of Substance 1: marijuana 1 - Age of First Use: unknown 1 - Amount (size/oz): unknown 1 - Frequency: occasional 1 - Duration: unknown 1 - Last Use /  Amount: last used on her birthday, 05/03/2016  Sleep: Poor  Appetite:  Fair  Current Medications: Current Facility-Administered Medications  Medication Dose Route Frequency Provider Last Rate Last Dose  . acetaminophen (TYLENOL) tablet 650 mg  650 mg Oral Q6H PRN Mordecai Maes, NP   650 mg at 06/10/18 2018  . alum & mag hydroxide-simeth (MAALOX/MYLANTA) 200-200-20 MG/5ML suspension 30 mL  30 mL Oral Q4H PRN Mordecai Maes, NP      . FLUoxetine (PROZAC) capsule 20 mg  20 mg Oral Daily Zaivion Kundrat, Myer Peer, MD   20 mg at 06/13/18 0835  . OLANZapine zydis (ZYPREXA) disintegrating tablet 5 mg  5 mg Oral Q8H PRN Connye Burkitt, NP   5 mg at 06/12/18 2320   And  . ziprasidone (GEODON) injection 20 mg  20 mg Intramuscular PRN Connye Burkitt, NP      . risperiDONE (RISPERDAL) tablet 1 mg  1 mg Oral BID Curtisha Bendix, Myer Peer, MD   1 mg at 06/13/18 0835  . traZODone (DESYREL) tablet 50 mg  50 mg Oral QHS PRN Connye Burkitt, NP   50 mg at 06/12/18 2225    Lab Results:  Results for orders placed or performed during the hospital encounter of 06/09/18 (from the past 48 hour(s))  Comprehensive metabolic panel     Status: Abnormal   Collection Time: 06/11/18  6:19 PM  Result Value Ref Range   Sodium 138 135 - 145 mmol/L   Potassium 2.9 (L) 3.5 - 5.1 mmol/L   Chloride 105 98 - 111 mmol/L   CO2 26 22 - 32 mmol/L   Glucose, Bld 103 (H) 70 - 99 mg/dL   BUN 9 6 - 20 mg/dL   Creatinine, Ser 0.67 0.44 - 1.00 mg/dL   Calcium 8.9 8.9 - 10.3 mg/dL   Total Protein 8.0 6.5 - 8.1 g/dL   Albumin 3.6 3.5 - 5.0 g/dL   AST 22 15 - 41 U/L   ALT 21 0 - 44 U/L   Alkaline Phosphatase 77 38 - 126 U/L   Total Bilirubin 0.4 0.3 - 1.2 mg/dL   GFR calc non Af Amer >60 >60 mL/min   GFR calc Af Amer >60 >60 mL/min   Anion gap 7 5 - 15    Comment: Performed at [facility], [place] [address] [place], [place] [address]  CBC with Differential/Platelet     Status: None   Collection Time: 06/11/18  6:19 PM   Result Value Ref Range   WBC 8.8 4.0 - 10.5 K/uL   RBC 4.21 3.87 - 5.11 MIL/uL   Hemoglobin 12.1 12.0 - 15.0 g/dL   HCT 36.4 36.0 - 46.0 %   MCV 86.5 80.0 - 100.0 fL   MCH 28.7 26.0 - 34.0 pg   MCHC 33.2 30.0 - 36.0 g/dL   RDW 12.3 11.5 - 15.5 %   Platelets 313 150 - 400 K/uL   nRBC 0.0 0.0 - 0.2 %   Neutrophils Relative % 62 %   Neutro Abs 5.4 1.7 -  7.7 K/uL   Lymphocytes Relative 30 %   Lymphs Abs 2.6 0.7 - 4.0 K/uL   Monocytes Relative 7 %   Monocytes Absolute 0.7 0.1 - 1.0 K/uL   Eosinophils Relative 1 %   Eosinophils Absolute 0.0 0.0 - 0.5 K/uL   Basophils Relative 0 %   Basophils Absolute 0.0 0.0 - 0.1 K/uL   Immature Granulocytes 0 %   Abs Immature Granulocytes 0.03 0.00 - 0.07 K/uL    Comment: Performed at [facility], [place] [address] [place], [place] [address]  hCG, quantitative, pregnancy     Status: None   Collection Time: 06/11/18  6:19 PM  Result Value Ref Range   hCG, Beta Chain, Quant, S <1 <5 mIU/mL    Comment:          GEST. AGE      CONC.  (mIU/mL)   <=1 WEEK        5 - 50     2 WEEKS       50 - 500     3 WEEKS       100 - 10,000     4 WEEKS     1,000 - 30,000     5 WEEKS     3,500 - 115,000   6-8 WEEKS     12,000 - 270,000    12 WEEKS     15,000 - 220,000        FEMALE AND NON-PREGNANT FEMALE:     LESS THAN 5 mIU/mL Performed at [facility], [place] [address] [place], [place] [address]   TSH     Status: None   Collection Time: 06/11/18  6:19 PM  Result Value Ref Range   TSH 1.088 0.350 - 4.500 uIU/mL    Comment: Performed by a 3rd Generation assay with a functional sensitivity of <=0.01 uIU/mL. Performed at [facility], [place] [address] [place], [place] [address]   Hemoglobin A1c     Status: None   Collection Time: 06/11/18  6:19 PM  Result Value Ref Range   Hgb A1c MFr Bld 5.1 4.8 - 5.6 %    Comment: (NOTE) Pre diabetes:          5.7%-6.4% Diabetes:              >6.4% Glycemic control  for   <7.0% adults with diabetes    Mean Plasma Glucose 99.67 mg/dL    Comment: Performed at [place] [address], [place], [place] [address]  Lipid panel     Status: Abnormal   Collection Time: 06/11/18  6:19 PM  Result Value Ref Range   Cholesterol 158 0 - 200 mg/dL   Triglycerides 54 <150 mg/dL   HDL 35 (L) >40 mg/dL   Total CHOL/HDL Ratio 4.5 RATIO   VLDL 11 0 - 40 mg/dL   LDL Cholesterol 112 (H) 0 - 99 mg/dL    Comment:        Total Cholesterol/HDL:CHD Risk Coronary Heart Disease Risk Table                     Men   Women  1/2 Average Risk   3.4   3.3  Average Risk       5.0   4.4  2 X Average Risk   9.6   7.1  3 X Average Risk  23.4   11.0        Use  the calculated Patient Ratio above and the CHD Risk Table to determine the patient's CHD Risk.        ATP III CLASSIFICATION (LDL):  <100     mg/dL   Optimal  100-129  mg/dL   Near or Above                    Optimal  130-159  mg/dL   Borderline  160-189  mg/dL   High  >190     mg/dL   Very High Performed at [place] [address] [place], [place] [address]     Blood Alcohol level:  Lab Results  Component Value Date   ETH <10 08/27/2017   ETH <10 93/57/0177    Metabolic Disorder Labs: Lab Results  Component Value Date   HGBA1C 5.1 06/11/2018   MPG 99.67 06/11/2018   MPG 91.06 08/29/2017   Lab Results  Component Value Date   PROLACTIN 93.0 (H) 08/29/2017   Lab Results  Component Value Date   CHOL 158 06/11/2018   TRIG 54 06/11/2018   HDL 35 (L) 06/11/2018   CHOLHDL 4.5 06/11/2018   VLDL 11 06/11/2018   LDLCALC 112 (H) 06/11/2018   LDLCALC 135 (H) 08/29/2017    Physical Findings: AIMS: Facial and Oral Movements Muscles of Facial Expression: None, normal Lips and Perioral Area: None, normal Jaw: None, normal Tongue: None, normal,Extremity Movements Upper (arms, wrists, hands, fingers): None, normal Lower (legs, knees, ankles, toes): None, normal, Trunk  Movements Neck, shoulders, hips: None, normal, Overall Severity Severity of abnormal movements (highest score from questions above): None, normal Incapacitation due to abnormal movements: None, normal Patient's awareness of abnormal movements (rate only patient's report): No Awareness, Dental Status Current problems with teeth and/or dentures?: No Does patient usually wear dentures?: No  CIWA:    COWS:     Musculoskeletal: Strength & Muscle Tone: within normal limits  Gait & Station: normal Patient leans: N/A  Psychiatric Specialty Exam: Physical Exam  ROS denies chest pain, denies shortness of breath, denies cough, denies nausea or vomiting  Blood pressure 120/80, pulse (!) 103, temperature 97.6 F (36.4 C), temperature source Oral, resp. rate 16, height '5\' 2"'  (1.575 m), weight 97.1 kg, SpO2 100 %.Body mass index is 39.14 kg/m.  General Appearance: Fairly Groomed  Eye Contact:  Fair  Speech:  Normal Rate  Volume:  Decreased  Mood:  reports mood as " same"  Affect:  anxious, blunted   Thought Process: Becoming more linear although still concrete.   Orientation:  Other:  remains alert, attentive, oriented to May, 2020 and to hospital  Thought Content:  (+) auditory hallucinations and internal preoccupations, today describes paranoid ideations that her mother may want to harm her  Suicidal Thoughts:  No denies suicidal or self-injurious ideations, denies homicidal or violent ideations, contracts for safety on unit  Homicidal Thoughts:  No  Memory:  Recent and remote fair  Judgement:  Impaired  Insight:  limited  Psychomotor Activity:  Normal-some pacing but responds to redirection and reassurance from staff   Concentration:  Concentration: Fair and Attention Span: Fair  Recall:  [employer] of Knowledge:  Fair  Language:  Fair  Akathisia:  Negative  Handed:  Right  AIMS (if indicated):     Assets:  Desire for Improvement Resilience  ADL's: Fair  Cognition:  WNL  Sleep:   Number of Hours: 2.25   Assessment 23 year old female, no children, lives with her mother.  Currently unemployed.  Presented to hospital voluntarily for "medication adjustment".  Presented as fair historian with vague description of symptoms, stating she has been off her psychiatric medications prior to admission and describing some depression and neurovegetative symptoms.  She was noted to be internally preoccupied, responding to internal stimuli, but denied auditory hallucinations.  Collateral information from mother indicated that patient has been noncompliant with medications, neglecting ADLs, not sleeping well, internally preoccupied, with mood instability and episodes of agitation.  Patient has a history of prior psychiatric admission, and has been diagnosed with schizoaffective disorder in the past.   Patient remains psychotic , with internal preoccupation, auditory hallucinations and paranoid ideations. Denies suicidal ideations. Tolerating Risperidone trial well thus far.   Treatment Plan Summary: Daily contact with patient to assess and evaluate symptoms and progress in treatment, Medication management, Plan Inpatient treatment and Medications as below  Treatment plan reviewed as below today 5/16 Encourage group/milieu participation to work on coping skills and symptom reduction Continue Prozac  20  mg daily for mood disorder Increase Risperidone 1 mg QAM and 2 mgrs QHS for psychosis Continue Trazodone 50 mg nightly PRN for insomnia Continue agitation protocol ( Zyprexa /Ativan PO / Geodon IM) for acute psychotic agitation as needed Repeat BMP in AM to monitor electrolytes. As discussed with RN staff, patient being moved to 500 hall.    Jenne Campus, MD 06/13/2018, 10:14 AM   Patient ID: Jeannette How, female   DOB: 01-18-1996, 23 y.o.   MRN: [id number]

## 2018-06-13 NOTE — Progress Notes (Signed)
D: Pt denies SI/HI/AVH. Pt is pleasant and cooperative. Pt very paranoid on the unit this evening. Pt avoidant this evening . Pt appeared to be responding to internal stimuli, pt seen talking to people not seen by staff  A: Pt was offered support and encouragement. Pt was given scheduled medications. Pt was encourage to attend groups. Q 15 minute checks were done for safety.   R:  safety maintained on unit.   Problem: Education: Goal: Emotional status will improve Outcome: Not Progressing   Problem: Education: Goal: Mental status will improve Outcome: Not Progressing

## 2018-06-13 NOTE — Progress Notes (Signed)
Writer has observed patient sitting in the dayroom talking and laughing to herself. She has been internally preoccupied. She recieved medication for sleep and later received zyprexa. Safety maintained on unit with 15 min checks.

## 2018-06-13 NOTE — Progress Notes (Signed)
D Patient observed standing at the main nurses' station. She i8s disheveled, not interacting with anybody. She smiels and whispers something to herslef and when this writer asks her what she is smiling about, she stares blankly ahead and does not repsond tot his Clinical research associate. She is disorganized. She is poorly dressed, needs to bathes and staff report she is een wandering in and out of various patient rooms on the 400 hall. She does not engage in conversation and does not understand and /or process staff's direction that she is allowed only in her room.      A Dr. Jama Flavors discussed patient status with this wirter and requests we transition patient to [address] hall and place patient on No Roommate status, this is done and verbal report given to 500 hall RN (EA).      R Safety in place and poc cont per pt need and MD request. AC (BC).

## 2018-06-14 LAB — BASIC METABOLIC PANEL
Anion gap: 9 (ref 5–15)
BUN: 9 mg/dL (ref 6–20)
CO2: 22 mmol/L (ref 22–32)
Calcium: 9.1 mg/dL (ref 8.9–10.3)
Chloride: 107 mmol/L (ref 98–111)
Creatinine, Ser: 0.67 mg/dL (ref 0.44–1.00)
GFR calc Af Amer: >60 mL/min (ref >60)
GFR calc non Af Amer: >60 mL/min (ref >60)
Glucose, Bld: 84 mg/dL (ref 70–99)
Potassium: 4 mmol/L (ref 3.5–5.1)
Sodium: 138 mmol/L (ref 135–145)

## 2018-06-14 MED ORDER — LORAZEPAM 1 MG PO TABS
2.0000 mg | ORAL_TABLET | Freq: Once | ORAL | Status: AC
  Administered 2018-06-14: 04:00:00 2 mg via ORAL
  Filled 2018-06-14: qty 2

## 2018-06-14 NOTE — Progress Notes (Signed)
Pt presents with a flat affect. Pt appears to be responding to internal stimuli AEB laughing and mumbling to herself. Pt initially denied AVH on approach, but this afternoon the pt came to the medication window requesting something to take to help with the hallucinations. Pt was administered prn Zyprexa at her request. Pt denies SI/HI. Pt was tachycardia this am. Fluids were encouraged and administered. Dr. Jama Flavors made aware. EKG completed and HR reassessed.   Medications reviewed with pt. Verbal support provided. Pt encouraged to attend groups. 15 minute checks performed for safety.   Pt compliant with taking meds and no side effects verbalized by the pt.

## 2018-06-14 NOTE — [facility] Group Notes (Signed)
[facility] LCSW Group Therapy Note  Date/Time:  06/14/2018  11:00AM-12:00PM  Type of Therapy and Topic:  Group Therapy:  Music and Mood  Participation Level:  Did Not Attend   Description of Group: In this process group, members listened to a variety of genres of music and identified that different types of music evoke different responses.  Patients were encouraged to identify music that was soothing for them and music that was energizing for them.  Patients discussed how this knowledge can help with wellness and recovery in various ways including managing depression and anxiety as well as encouraging healthy sleep habits.    Therapeutic Goals: 1. Patients will explore the impact of different varieties of music on mood 2. Patients will verbalize the thoughts they have when listening to different types of music 3. Patients will identify music that is soothing to them as well as music that is energizing to them 4. Patients will discuss how to use this knowledge to assist in maintaining wellness and recovery 5. Patients will explore the use of music as a coping skill  Summary of Patient Progress:   N/A  Therapeutic Modalities: Solution Focused Brief Therapy Activity   Kristle Wesch    

## 2018-06-14 NOTE — Progress Notes (Signed)
Pt up paranoid, pt stating sge wanted some medicine. Pt given 5 mg Zyprexa per [facility]

## 2018-06-14 NOTE — Progress Notes (Signed)
D: Patient observed isolative to room much of the evening. Came out briefly to dayroom but returned shortly thereafter. Patient states, "I'm fine. I've had a good day I guess." When asked about AVH patient initially denies however then admits to [facility]. "I see lights. But I'm not hearing anything right now. I do sometimes." (Patient did receive zydis this afternoon.) Patient's affect anxious, preoccupied, ambivalent and mood congruent.   Denies pain, physical complaints. COVID-19 screen negative, afebrile. Respiratory assessment WDL.  A: Medicated per orders, prn zydis given for VH, agitation just now. Medication education provided. Level III obs in place for safety. Emotional support offered.  Encouraged to attend and participate in unit programming.   R: Patient verbalizes some understanding of POC. Will reassess in one hour. Patient denies SI/HI and remains safe on level III obs. Will continue to monitor throughout the night.

## 2018-06-14 NOTE — Progress Notes (Addendum)
[facility] MD Progress Note  06/14/2018 10:03 AM Renee [name]  MRN:  [id number]   Subjective: Patient reports today that she is feeling better.  Patient denies any suicidal or homicidal ideations.  When patient is asked about hallucinations at first she denies them, but then she reports having voices that she can hear sometimes.  She reported that she slept good last night and she estimated 8 hours of sleep.  Patient reports that she has been eating good.  Patient denies any medication side effects.  Objective: Patient's chart and findings reviewed and discussed with treatment team.  Patient presents in her room and is pleasant, calm, and cooperative.  Patient appears to be a little lethargic possibly due to the medications.  Patient does seem to be improved since yesterday.  Patient is having logical conversation, but her speech is somewhat slurred and she reports having dry mouth which is possibly due to the medication.  The patient is encouraged to drink more fluids.  Patient did request to be discharged and she is informed that she will have to continue the medications and we want to see continued improvement and patient states understanding and agreement.  Also of note patient reported having good sleep however it was only 1.5 hours documented  Principal Problem: Schizoaffective disorder (HCC) Diagnosis: Principal Problem:   Schizoaffective disorder (HCC) Active Problems:   Substance-induced psychotic disorder (HCC)   MDD (major depressive disorder)  Total Time spent with patient: 20 minutes  Past Psychiatric History: See H&P  Past Medical History:  Past Medical History:  Diagnosis Date  . Anxiety   . Asthma    History reviewed. No pertinent surgical history. Family History: History reviewed. No pertinent family history. Family Psychiatric  History: See H&P Social History:  Social History   Substance and Sexual Activity  Alcohol Use No     Social History   Substance and Sexual  Activity  Drug Use No    Social History   Socioeconomic History  . Marital status: Single    Spouse name: Not on file  . Number of children: Not on file  . Years of education: Not on file  . Highest education level: Not on file  Occupational History  . Not on file  Social Needs  . Financial resource strain: Not on file  . Food insecurity:    Worry: Not on file    Inability: Not on file  . Transportation needs:    Medical: Not on file    Non-medical: Not on file  Tobacco Use  . Smoking status: Never Smoker  . Smokeless tobacco: Never Used  Substance and Sexual Activity  . Alcohol use: No  . Drug use: No  . Sexual activity: Never  Lifestyle  . Physical activity:    Days per week: Not on file    Minutes per session: Not on file  . Stress: Not on file  Relationships  . Social connections:    Talks on phone: Not on file    Gets together: Not on file    Attends religious service: Not on file    Active member of club or organization: Not on file    Attends meetings of clubs or organizations: Not on file    Relationship status: Not on file  Other Topics Concern  . Not on file  Social History Narrative  . Not on file   Additional Social History:    Pain Medications: See MAR Prescriptions: See MAR Over the Counter: See [facility]  History of alcohol / drug use?: Yes Longest period of sobriety (when/how long): unknown Name of Substance 1: marijuana 1 - Age of First Use: unknown 1 - Amount (size/oz): unknown 1 - Frequency: occasional 1 - Duration: unknown 1 - Last Use / Amount: last used on her birthday, 05/03/2016                  Sleep: Poor  Appetite:  Fair  Current Medications: Current Facility-Administered Medications  Medication Dose Route Frequency Provider Last Rate Last Dose  . acetaminophen (TYLENOL) tablet 650 mg  650 mg Oral Q6H PRN Denzil Magnusonhomas, Lashunda, NP   650 mg at 06/10/18 2018  . alum & mag hydroxide-simeth (MAALOX/MYLANTA) 200-200-20 MG/5ML  suspension 30 mL  30 mL Oral Q4H PRN Denzil Magnusonhomas, Lashunda, NP      . FLUoxetine (PROZAC) capsule 20 mg  20 mg Oral Daily , Rockey SituFernando A, MD   20 mg at 06/14/18 0836  . OLANZapine zydis (ZYPREXA) disintegrating tablet 5 mg  5 mg Oral Q8H PRN Aldean BakerSykes, Janet E, NP   5 mg at 06/14/18 0123   And  . ziprasidone (GEODON) injection 20 mg  20 mg Intramuscular PRN Aldean BakerSykes, Janet E, NP      . risperiDONE (RISPERDAL) tablet 1 mg  1 mg Oral BH-q7a , Rockey SituFernando A, MD   1 mg at 06/14/18 0836  . risperiDONE (RISPERDAL) tablet 2 mg  2 mg Oral QHS , Rockey SituFernando A, MD   2 mg at 06/13/18 2108  . traZODone (DESYREL) tablet 50 mg  50 mg Oral QHS PRN Aldean BakerSykes, Janet E, NP   50 mg at 06/13/18 2108    Lab Results: No results found for this or any previous visit (from the past 48 hour(s)).  Blood Alcohol level:  Lab Results  Component Value Date   ETH <10 08/27/2017   ETH <10 08/20/2017    Metabolic Disorder Labs: Lab Results  Component Value Date   HGBA1C 5.1 06/11/2018   MPG 99.67 06/11/2018   MPG 91.06 08/29/2017   Lab Results  Component Value Date   PROLACTIN 93.0 (H) 08/29/2017   Lab Results  Component Value Date   CHOL 158 06/11/2018   TRIG 54 06/11/2018   HDL 35 (L) 06/11/2018   CHOLHDL 4.5 06/11/2018   VLDL 11 06/11/2018   LDLCALC 112 (H) 06/11/2018   LDLCALC 135 (H) 08/29/2017    Physical Findings: AIMS: Facial and Oral Movements Muscles of Facial Expression: None, normal Lips and Perioral Area: None, normal Jaw: None, normal Tongue: None, normal,Extremity Movements Upper (arms, wrists, hands, fingers): None, normal Lower (legs, knees, ankles, toes): None, normal, Trunk Movements Neck, shoulders, hips: None, normal, Overall Severity Severity of abnormal movements (highest score from questions above): None, normal Incapacitation due to abnormal movements: None, normal Patient's awareness of abnormal movements (rate only patient's report): No Awareness, Dental Status Current problems  with teeth and/or dentures?: No Does patient usually wear dentures?: No  CIWA:    COWS:     Musculoskeletal: Strength & Muscle Tone: within normal limits Gait & Station: normal Patient leans: N/A  Psychiatric Specialty Exam: Physical Exam  Nursing note and vitals reviewed. Constitutional: She is oriented to person, place, and time. She appears well-developed and well-nourished.  Respiratory: Effort normal.  Musculoskeletal: Normal range of motion.  Neurological: She is alert and oriented to person, place, and time.  Skin: Skin is warm.    Review of Systems  Constitutional: Negative.   HENT: Negative.   Eyes: Negative.  Respiratory: Negative.   Cardiovascular: Negative.   Gastrointestinal: Negative.   Genitourinary: Negative.   Musculoskeletal: Negative.   Skin: Negative.   Neurological: Negative.   Endo/Heme/Allergies: Negative.   Psychiatric/Behavioral: Positive for hallucinations.    Blood pressure 104/72, pulse (!) 158, temperature 97.6 F (36.4 C), temperature source Oral, resp. rate 16, height 5\' 2"  (1.575 m), weight 97.1 kg, SpO2 100 %.Body mass index is 39.14 kg/m.  General Appearance: Disheveled and appears lehargic  Eye Contact:  Fair  Speech:  Garbled and Slow  Volume:  Decreased  Mood:  Depressed  Affect:  Flat  Thought Process:  Linear and Descriptions of Associations: Intact  Orientation:  Full (Time, Place, and Person)  Thought Content:  Hallucinations: Auditory  Suicidal Thoughts:  No  Homicidal Thoughts:  No  Memory:  Immediate;   Fair Recent;   Fair Remote;   Poor  Judgement:  Impaired  Insight:  Lacking  Psychomotor Activity:  Normal  Concentration:  Concentration: Good and Attention Span: Good  Recall:  Good  Fund of Knowledge:  Fair  Language:  Poor  Akathisia:  No  Handed:  Right  AIMS (if indicated):     Assets:  Communication Skills Desire for Improvement Financial Resources/Insurance Housing Physical Health Social  Support Transportation  ADL's:  Intact  Cognition:  WNL  Sleep:  Number of Hours: 1.5   Problems addressed Schizoaffective disorder  Treatment Plan Summary: Daily contact with patient to assess and evaluate symptoms and progress in treatment, Medication management and Plan is to: Continue Prozac 20 mg p.o. daily for mood stability Continue agitation protocol for Zyprexa and Geodon Continue Risperdal 1 mg p.o. every morning and 2 mg p.o. nightly for schizoaffective disorder Continue trazodone 50 mg p.o. nightly as needed for insomnia Encourage group therapy participation Continue every 15 minute safety checks CSW to assist with disposition   Gerlene Burdock Money, FNP 06/14/2018, 10:03 AM   Agree with NP Progress Note

## 2018-06-15 DIAGNOSIS — F250 Schizoaffective disorder, bipolar type: Secondary | ICD-10-CM

## 2018-06-15 MED ORDER — RISPERIDONE 2 MG PO TABS: 2.5000 mg | ORAL_TABLET | Freq: Every day | ORAL | Status: DC

## 2018-06-15 MED ORDER — OMEGA-3-ACID ETHYL ESTERS 1 G PO CAPS
1.0000 g | ORAL_CAPSULE | Freq: Two times a day (BID) | ORAL | Status: DC
  Administered 2018-06-15 – 2018-06-16 (×3): 1 g via ORAL
  Filled 2018-06-15 (×7): qty 1

## 2018-06-15 MED ORDER — TEMAZEPAM 30 MG PO CAPS
30.0000 mg | ORAL_CAPSULE | Freq: Every day | ORAL | Status: DC
  Administered 2018-06-15: 21:00:00 30 mg via ORAL
  Filled 2018-06-15: qty 1

## 2018-06-15 MED ORDER — COMPLETENATE 29-1 MG PO CHEW
1.0000 | CHEWABLE_TABLET | Freq: Every day | ORAL | Status: DC
  Filled 2018-06-15: qty 1

## 2018-06-15 MED ORDER — RISPERIDONE 3 MG PO TABS
3.0000 mg | ORAL_TABLET | Freq: Every day | ORAL | Status: DC
  Administered 2018-06-15: 21:00:00 3 mg via ORAL
  Filled 2018-06-15 (×4): qty 1

## 2018-06-15 MED ORDER — PRENATAL MULTIVITAMIN CH
1.0000 | ORAL_TABLET | Freq: Every day | ORAL | Status: DC
  Administered 2018-06-15 – 2018-06-16 (×2): 1 via ORAL
  Filled 2018-06-15 (×4): qty 1

## 2018-06-15 NOTE — Progress Notes (Signed)
[facility]  06/15/2018 8:22 AM Renee [name]  MRN:  [id number] Subjective:    Shown some improvement since admission and through the weekend she reports that she recently heard voices but none now she states her mouth is no longer dry, she is taking her medication she states that she is interested in going home. She is unclear as to exactly what brought her in the hospital other than family concerns but she denies any suicidal or homicidal thoughts and she is giving me a coherent sentences which again she had a disorganization in her presentation previously. No EPS no TD Principal Problem: Schizoaffective disorder (HCC) Diagnosis: Principal Problem:   Schizoaffective disorder (HCC) Active Problems:   Substance-induced psychotic disorder (HCC)   MDD (major depressive disorder)  Total Time spent with patient: 20 minutes  Past Medical History:  Past Medical History:  Diagnosis Date  . Anxiety   . Asthma    History reviewed. No pertinent surgical history. Family History: History reviewed. No pertinent family history. Family Psychiatric  History: neg Social History:  Social History   Substance and Sexual Activity  Alcohol Use No     Social History   Substance and Sexual Activity  Drug Use No    Social History   Socioeconomic History  . Marital status: Single    Spouse name: Not on file  . Number of children: Not on file  . Years of education: Not on file  . Highest education level: Not on file  Occupational History  . Not on file  Social Needs  . Financial resource strain: Not on file  . Food insecurity:    Worry: Not on file    Inability: Not on file  . Transportation needs:    Medical: Not on file    Non-medical: Not on file  Tobacco Use  . Smoking status: Never Smoker  . Smokeless tobacco: Never Used  Substance and Sexual Activity  . Alcohol use: No  . Drug use: No  . Sexual activity: Never  Lifestyle  . Physical activity:    Days per week:  Not on file    Minutes per session: Not on file  . Stress: Not on file  Relationships  . Social connections:    Talks on phone: Not on file    Gets together: Not on file    Attends religious service: Not on file    Active member of club or organization: Not on file    Attends meetings of clubs or organizations: Not on file    Relationship status: Not on file  Other Topics Concern  . Not on file  Social History Narrative  . Not on file   Additional Social History:    Pain Medications: See MAR Prescriptions: See MAR Over the Counter: See MAR History of alcohol / drug use?: Yes Longest period of sobriety (when/how long): unknown Name of Substance 1: marijuana 1 - Age of First Use: unknown 1 - Amount (size/oz): unknown 1 - Frequency: occasional 1 - Duration: unknown 1 - Last Use / Amount: last used on her birthday, 05/03/2016                  Sleep: Good  Appetite:  Good  Current Medications: Current Facility-Administered Medications  Medication Dose Route Frequency Provider Last Rate Last Dose  . acetaminophen (TYLENOL) tablet 650 mg  650 mg Oral Q6H PRN Denzil Magnusonhomas, Lashunda, NP   650 mg at 06/10/18 2018  . alum & mag hydroxide-simeth (  MAALOX/MYLANTA) 200-200-20 MG/5ML suspension 30 mL  30 mL Oral Q4H PRN Denzil Magnuson, NP      . FLUoxetine (PROZAC) capsule 20 mg  20 mg Oral Daily Cobos, Rockey Situ, MD   20 mg at 06/15/18 0811  . OLANZapine zydis (ZYPREXA) disintegrating tablet 5 mg  5 mg Oral Q8H PRN Aldean Baker, NP   5 mg at 06/14/18 2300   And  . ziprasidone (GEODON) injection 20 mg  20 mg Intramuscular PRN Aldean Baker, NP      . risperiDONE (RISPERDAL) tablet 1 mg  1 mg Oral BH-q7a Cobos, Rockey Situ, MD   1 mg at 06/15/18 0811  . risperiDONE (RISPERDAL) tablet 2 mg  2 mg Oral QHS Cobos, Rockey Situ, MD   2 mg at 06/14/18 2108  . traZODone (DESYREL) tablet 50 mg  50 mg Oral QHS PRN Aldean Baker, NP   50 mg at 06/14/18 2108    Lab Results:  Results for  orders placed or performed during the hospital encounter of 06/09/18 (from the past 48 hour(s))  Basic metabolic panel     Status: None   Collection Time: 06/14/18  6:05 PM  Result Value Ref Range   Sodium 138 135 - 145 mmol/L   Potassium 4.0 3.5 - 5.1 mmol/L   Chloride 107 98 - 111 mmol/L   CO2 22 22 - 32 mmol/L   Glucose, Bld 84 70 - 99 mg/dL   BUN 9 6 - 20 mg/dL   Creatinine, Ser 1.47 0.44 - 1.00 mg/dL   Calcium 9.1 8.9 - 82.9 mg/dL   GFR calc non Af Amer >60 >60 mL/min   GFR calc Af Amer >60 >60 mL/min   Anion gap 9 5 - 15    Comment: Performed at [facility], [address], [place], [place] [address]    Blood Alcohol level:  Lab Results  Component Value Date   ETH <10 08/27/2017   ETH <10 08/20/2017    Metabolic Disorder Labs: Lab Results  Component Value Date   HGBA1C 5.1 06/11/2018   MPG 99.67 06/11/2018   MPG 91.06 08/29/2017   Lab Results  Component Value Date   PROLACTIN 93.0 (H) 08/29/2017   Lab Results  Component Value Date   CHOL 158 06/11/2018   TRIG 54 06/11/2018   HDL 35 (L) 06/11/2018   CHOLHDL 4.5 06/11/2018   VLDL 11 06/11/2018   LDLCALC 112 (H) 06/11/2018   LDLCALC 135 (H) 08/29/2017    Physical Findings: AIMS: Facial and Oral Movements Muscles of Facial Expression: None, normal Lips and Perioral Area: None, normal Jaw: None, normal Tongue: None, normal,Extremity Movements Upper (arms, wrists, hands, fingers): None, normal Lower (legs, knees, ankles, toes): None, normal, Trunk Movements Neck, shoulders, hips: None, normal, Overall Severity Severity of abnormal movements (highest score from questions above): None, normal Incapacitation due to abnormal movements: None, normal Patient's awareness of abnormal movements (rate only patient's report): No Awareness, Dental Status Current problems with teeth and/or dentures?: No Does patient usually wear dentures?: No  CIWA:    COWS:     Musculoskeletal: Strength &  Muscle Tone: within normal limits Gait & Station: normal Patient leans: N/A  Psychiatric Specialty Exam: Physical Exam  ROS  Blood pressure 123/79, pulse (!) 113, temperature 97.8 F (36.6 C), temperature source Oral, resp. rate 16, height  (1.575 m), weight 97.1 kg, SpO2 100 %.Body mass index is 39.14 kg/m.  General Appearance: Casual  Eye Contact:  Good  Speech:  Clear and Coherent  Volume:  Decreased  Mood:  Euthymic  Affect:  Appropriate and Congruent  Thought Process:  Goal Directed and Descriptions of Associations: Intact  Orientation:  Full (Time, Place, and Person)  Thought Content:  Tangential  Suicidal Thoughts:  No  Homicidal Thoughts:  No  Memory:  Immediate;   Good  Judgement:  Good  Insight:  Good  Psychomotor Activity:  Normal  Concentration:  Concentration: Good  Recall:  Good  Fund of Knowledge:  Good  Language:  Good  Akathisia:  Negative  Handed:  Right  AIMS (if indicated):     Assets:  Leisure Time Physical Health Resilience Social Support  ADL's:  Intact  Cognition:  WNL  Sleep:  Number of Hours: 3     Treatment Plan Summary: Daily contact with patient to assess and evaluate symptoms and progress in treatment, Medication management and Plan We will add sleep aid at B vitamins neuroprotective measures continue cognitive therapy reality-based therapy current meds probable discharge tomorrow  Malvin Johns, MD 06/15/2018, 8:22 AM

## 2018-06-15 NOTE — [facility] Group Notes (Signed)
[facility] LCSW Group Therapy Note  Date/Time: 06/15/18, 1315  Type of Therapy and Topic:  Group Therapy:  Overcoming Obstacles  Participation Level:  moderate  Description of Group:    In this group patients will be encouraged to explore what they see as obstacles to their own wellness and recovery. They will be guided to discuss their thoughts, feelings, and behaviors related to these obstacles. The group will process together ways to cope with barriers, with attention given to specific choices patients can make. Each patient will be challenged to identify changes they are motivated to make in order to overcome their obstacles. This group will be process-oriented, with patients participating in exploration of their own experiences as well as giving and receiving support and challenge from other group members.  Therapeutic Goals: 1. Patient will identify personal and current obstacles as they relate to admission. 2. Patient will identify barriers that currently interfere with their wellness or overcoming obstacles.  3. Patient will identify feelings, thought process and behaviors related to these barriers. 4. Patient will identify two changes they are willing to make to overcome these obstacles:    Summary of Patient Progress: Pt attentive during group and appeared engaged.  Pt did not participate in group discussion, but when CSW called on her and asked about her obstacle, she began telling a long story about a "big giant" who was causing her problems, leading other group members to make comments about what pt was talking about.  Pt pleasant throughout, also reported her long term goal was to get married and have a family.       Therapeutic Modalities:   Cognitive Behavioral Therapy Solution Focused Therapy Motivational Interviewing Relapse Prevention Therapy  Daleen Squibb, LCSW

## 2018-06-15 NOTE — Tx Team (Signed)
Interdisciplinary Treatment and Diagnostic Plan Update  06/15/2018 Time of Session: 0839am Renee [name] MRN: [id number]  Principal Diagnosis: Schizoaffective disorder [facility])  Secondary Diagnoses: Principal Problem:   Schizoaffective disorder (HCC) Active Problems:   Substance-induced psychotic disorder (HCC)   MDD (major depressive disorder)   Current Medications:  Current Facility-Administered Medications  Medication Dose Route Frequency Provider Last Rate Last Dose  . acetaminophen (TYLENOL) tablet 650 mg  650 mg Oral Q6H PRN Denzil Magnuson, NP   650 mg at 06/10/18 2018  . alum & mag hydroxide-simeth (MAALOX/MYLANTA) 200-200-20 MG/5ML suspension 30 mL  30 mL Oral Q4H PRN Denzil Magnuson, NP      . FLUoxetine (PROZAC) capsule 20 mg  20 mg Oral Daily Cobos, Rockey Situ, MD   20 mg at 06/15/18 0811  . OLANZapine zydis (ZYPREXA) disintegrating tablet 5 mg  5 mg Oral Q8H PRN Aldean Baker, NP   5 mg at 06/14/18 2300   And  . ziprasidone (GEODON) injection 20 mg  20 mg Intramuscular PRN Aldean Baker, NP      . omega-3 acid ethyl esters (LOVAZA) capsule 1 g  1 g Oral BID Malvin Johns, MD      . prenatal multivitamin tablet 1 tablet  1 tablet Oral Daily Antonieta Pert, MD      . risperiDONE (RISPERDAL) tablet 1 mg  1 mg Oral BH-[id number] Cobos, Rockey Situ, MD   1 mg at 06/15/18 8250  . risperiDONE (RISPERDAL) tablet 3 mg  3 mg Oral QHS Malvin Johns, MD      . temazepam (RESTORIL) capsule 30 mg  30 mg Oral QHS Malvin Johns, MD      . traZODone (DESYREL) tablet 50 mg  50 mg Oral QHS PRN Aldean Baker, NP   50 mg at 06/14/18 2108   PTA Medications: Medications Prior to Admission  Medication Sig Dispense Refill Last Dose  . albuterol (PROAIR HFA) 108 (90 Base) MCG/ACT inhaler Inhale 2 puffs into the lungs every 6 (six) hours. For shortness of breath 1 Inhaler 3   . citalopram (CELEXA) 20 MG tablet Take 20 mg by mouth daily.   Not Taking at Unknown time  . hydrOXYzine  (ATARAX/VISTARIL) 50 MG tablet Take 1 tablet (50 mg) by mouth four times daily as needed: For anxiety/sleep (Patient not taking: Reported on 02/13/2018) 90 tablet 0 Not Taking at Unknown time    Patient Stressors: Medication change or noncompliance Substance abuse  Patient Strengths: Physical Health Supportive family/friends  Treatment Modalities: Medication Management, Group therapy, Case management,  1 to 1 session with clinician, Psychoeducation, Recreational therapy.   Physician Treatment Plan for Primary Diagnosis: Schizoaffective disorder (HCC) Long Term Goal(s): Improvement in symptoms so as ready for discharge Improvement in symptoms so as ready for discharge   Short Term Goals: Ability to identify changes in lifestyle to reduce recurrence of condition will improve Ability to verbalize feelings will improve Ability to disclose and discuss suicidal ideas Ability to demonstrate self-control will improve Ability to identify and develop effective coping behaviors will improve  Medication Management: Evaluate patient's response, side effects, and tolerance of medication regimen.  Therapeutic Interventions: 1 to 1 sessions, Unit Group sessions and Medication administration.  Evaluation of Outcomes: Progressing  Physician Treatment Plan for Secondary Diagnosis: Principal Problem:   Schizoaffective disorder (HCC) Active Problems:   Substance-induced psychotic disorder (HCC)   MDD (major depressive disorder)  Long Term Goal(s): Improvement in symptoms so as ready for discharge Improvement in symptoms so  as ready for discharge   Short Term Goals: Ability to identify changes in lifestyle to reduce recurrence of condition will improve Ability to verbalize feelings will improve Ability to disclose and discuss suicidal ideas Ability to demonstrate self-control will improve Ability to identify and develop effective coping behaviors will improve     Medication Management: Evaluate  patient's response, side effects, and tolerance of medication regimen.  Therapeutic Interventions: 1 to 1 sessions, Unit Group sessions and Medication administration.  Evaluation of Outcomes: Progressing   RN Treatment Plan for Primary Diagnosis: Schizoaffective disorder (HCC) Long Term Goal(s): Knowledge of disease and therapeutic regimen to maintain health will improve  Short Term Goals: Ability to verbalize feelings will improve, Ability to identify and develop effective coping behaviors will improve and Compliance with prescribed medications will improve  Medication Management: RN will administer medications as ordered by provider, will assess and evaluate patient's response and provide education to patient for prescribed medication. RN will report any adverse and/or side effects to prescribing provider.  Therapeutic Interventions: 1 on 1 counseling sessions, Psychoeducation, Medication administration, Evaluate responses to treatment, Monitor vital signs and CBGs as ordered, Perform/monitor CIWA, COWS, AIMS and Fall Risk screenings as ordered, Perform wound care treatments as ordered.  Evaluation of Outcomes: Progressing   LCSW Treatment Plan for Primary Diagnosis: Schizoaffective disorder (HCC) Long Term Goal(s): Safe transition to appropriate next level of care at discharge, Engage patient in therapeutic group addressing interpersonal concerns.  Short Term Goals: Engage patient in aftercare planning with referrals and resources, Increase social support, Identify triggers associated with mental health/substance abuse issues and Increase skills for wellness and recovery  Therapeutic Interventions: Assess for all discharge needs, 1 to 1 time with Social worker, Explore available resources and support systems, Assess for adequacy in community support network, Educate family and significant other(s) on suicide prevention, Complete Psychosocial Assessment, Interpersonal group  therapy.  Evaluation of Outcomes: Progressing   Progress in Treatment: Attending groups: No. Participating in groups: No. Taking medication as prescribed: Yes. Toleration medication: Yes. Family/Significant other contact made: Yes, individual(s) contacted:  mother Patient understands diagnosis: Yes. Discussing patient identified problems/goals with staff: Yes. Medical problems stabilized or resolved: No. Denies suicidal/homicidal ideation: Yes. Issues/concerns per patient self-inventory: No.  New problem(s) identified: No, Describe:  CSW continuing to assess  New Short Term/Long Term Goal(s): medication management for mood stabilization; elimination of SI thoughts; development of comprehensive mental wellness/sobriety plan.  Patient Goals: Get back on medication.  Discharge Plan or Barriers: CSW continuing to assess for appropriate referrals.  Reason for Continuation of Hospitalization: Anxiety Depression Hallucinations - appears to be responding to internal stimuli  Estimated Length of Stay: 2-4 days   Attendees: Patient: 06/15/2018   Physician: Dr. Jeannine KittenFarah, MD 06/15/2018   Nursing: Roddie McElizabeth Awofadeju, RN 06/15/2018   RN Care Manager: 06/15/2018   Social Worker: Daleen SquibbGreg Rutherford Alarie, LCSW 06/15/2018   Recreational Therapist:  06/15/2018   Other:  06/15/2018   Other:  06/15/2018   Other: 06/15/2018       Scribe for Treatment Team: Lorri FrederickWierda, Dimitri Dsouza Jon, LCSW 06/15/2018 11:10 AM

## 2018-06-15 NOTE — Progress Notes (Signed)
D: Pt denies SI/HI/AVH. Pt is pleasant and cooperative. Pt visual on the unit much this evening. Pt seen pacing at times, pt visible in the dayroom a little. Pt seen talking to people not seen by staff. Pt has thought blocking at times due to this interaction . Pt appeared paranoid, but was much more pleasant after being engaged.  A: Pt was offered support and encouragement. Pt was given scheduled medications. Pt was encourage to attend groups. Q 15 minute checks were done for safety.  R:Pt attends groups and interacts  with peers and staff. Pt is taking medication. Pt has no complaints.Pt receptive to treatment and safety maintained on unit.

## 2018-06-15 NOTE — Progress Notes (Signed)
Recreation Therapy Notes  Date: 5.18.20 Time: 1000 Location: 500 Dayroom  Group Topic: Coping Skills  Goal Area(s) Addresses:  Patient will identify positive coping skills. Patient will identify benefit of using coping skills. Patient will identify benefits to using coping skills post d/c.  Intervention:  Worksheet  Activity:  Coping Skills A to Z.  Patients were to identify a positive coping skill for each letter of the alphabet.    Education: Coping Skills, Discharge Planning.   Education Outcome: Acknowledges understanding/In group clarification offered/Needs additional education.   Clinical Observations/Feedback: Pt did not attend group session.    Wyat Infinger, LRT/CTRS         Jami Ohlin A 06/15/2018 11:38 AM 

## 2018-06-15 NOTE — Progress Notes (Signed)
DAR NOTE: Patient presents with flat affect and depressed mood.  Denies suicidal thoughts, pain, auditory and visual hallucinations but is observed responding to internal stimuli.  Rates depression at 2, hopelessness at 0, and anxiety at 0.  Maintained on routine safety checks.  Medications given as prescribed.  Support and encouragement offered as needed.  Attended group and participated.  States goal for today is "getting out."  Patient observed pacing the hallway talking to herself.  Offered no complaint.

## 2018-06-16 MED ORDER — RISPERIDONE 4 MG PO TABS: 4.0000 mg | ORAL_TABLET | Freq: Every day | ORAL | 2 refills | Status: AC

## 2018-06-16 MED ORDER — ARIPIPRAZOLE ER 400 MG IM SRER
400.0000 mg | INTRAMUSCULAR | Status: DC
  Administered 2018-06-16: 11:00:00 400 mg via INTRAMUSCULAR

## 2018-06-16 MED ORDER — ARIPIPRAZOLE ER 400 MG IM SRER: 400.0000 mg | INTRAMUSCULAR | 0 refills | Status: AC

## 2018-06-16 MED ORDER — OMEGA-3-ACID ETHYL ESTERS 1 G PO CAPS: 1.0000 g | ORAL_CAPSULE | Freq: Two times a day (BID) | ORAL | 0 refills | Status: AC

## 2018-06-16 MED ORDER — RISPERIDONE 1 MG PO TABS: 1.0000 mg | ORAL_TABLET | ORAL | 0 refills | Status: DC

## 2018-06-16 MED ORDER — RISPERIDONE 3 MG PO TABS: 3.0000 mg | ORAL_TABLET | Freq: Every day | ORAL | 0 refills | Status: DC

## 2018-06-16 MED ORDER — PRENATAL MULTIVITAMIN CH: 1.0000 | ORAL_TABLET | Freq: Every day | ORAL | Status: AC

## 2018-06-16 MED ORDER — TEMAZEPAM 30 MG PO CAPS: 30.0000 mg | ORAL_CAPSULE | Freq: Every day | ORAL | 0 refills | Status: AC

## 2018-06-16 NOTE — [facility] Group Notes (Signed)
[facility] LCSW Group Therapy Note  Date/Time: 06/16/18, 1100  Type of Therapy/Topic:  Group Therapy:  Balance in Life  Participation Level:  none  Description of Group:    This group will address the concept of balance and how it feels and looks when one is unbalanced. Patients will be encouraged to process areas in their lives that are out of balance, and identify reasons for remaining unbalanced. Facilitators will guide patients utilizing problem- solving interventions to address and correct the stressor making their life unbalanced. Understanding and applying boundaries will be explored and addressed for obtaining  and maintaining a balanced life. Patients will be encouraged to explore ways to assertively make their unbalanced needs known to significant others in their lives, using other group members and facilitator for support and feedback.  Therapeutic Goals: 1. Patient will identify two or more emotions or situations they have that consume much of in their lives. 2. Patient will identify signs/triggers that life has become out of balance:  3. Patient will identify two ways to set boundaries in order to achieve balance in their lives:  4. Patient will demonstrate ability to communicate their needs through discussion and/or role plays  Summary of Patient Progress:Pt came to group late for only a few minutes and then left again without participating.           Therapeutic Modalities:   Cognitive Behavioral Therapy Solution-Focused Therapy Assertiveness Training  Daleen Squibb, [place]

## 2018-06-16 NOTE — Progress Notes (Addendum)
CSW spoke with pt about discharge plans--there was a note on the social work handoff that pt may be going to [place] at discharge.  Pt reports that she is not planning to go to [place], plans to stay in [place].  CSW asked pt to contact her mother to discuss and she did this, came back and told CSW again that she plans to stay in [place].  Pt reports she does not have a current mental health provider, states she does not want a therapy appt at this time, also said for CSW to "not worry" scheduling her a psychiatry appt and she would do it.  CSW informed her that CSW does have to arrange follow up and schedule an appt as part of her discharge and pt OK with this.  CSW attempted to contact pt mother to confirm all this, no answer, left message. Renee [name], MSW, LCSW Clinical Social Worker 06/16/2018 9:36 AM   CSW spoke with pt mother who reports that pt will not be going to [place] immediately and will be staying in [place] for now.  Pt has not been seeing any mental health providers.  Mother is available to pick her up when she is ready. Renee [name], MSW, LCSW Clinical Social Worker 06/16/2018 10:28 AM

## 2018-06-16 NOTE — Progress Notes (Signed)
Pt discharged to lobby. Pt was stable and appreciative at that time. All papers and prescriptions were given and valuables returned. Verbal understanding expressed. Denies SI/HI and A/VH. Pt given opportunity to express concerns and ask questions.  

## 2018-06-16 NOTE — Discharge Summary (Signed)
Physician Discharge Summary Note  Patient:  Renee [name] is an 23 y.o., female MRN:  [id number] DOB:  February 02, 1995 Patient phone:  [phone] (home)  Patient address:   [address]. [place] [address],  Total Time spent with patient: 15 minutes  Date of Admission:  06/09/2018 Date of Discharge: 06/16/18  Reason for Admission:  psychosis  Principal Problem: Schizoaffective disorder [facility]) Discharge Diagnoses: Principal Problem:   Schizoaffective disorder (HCC) Active Problems:   Substance-induced psychotic disorder [facility])   MDD (major depressive disorder)   Past Psychiatric History: Per admission H&P: Hospitalized at [facility] in August 2019 for depression with psychosis and discharged on Prozac and Abilify. Per mother she did not follow up with outpatient providers or take medication. History of marijuana use. No history of suicide attempts.  Past Medical History:  Past Medical History:  Diagnosis Date  . Anxiety   . Asthma    History reviewed. No pertinent surgical history. Family History: History reviewed. No pertinent family history. Family Psychiatric  History: Per admission H&P: Patient reported mother has schizophrenia. Social History:  Social History   Substance and Sexual Activity  Alcohol Use No     Social History   Substance and Sexual Activity  Drug Use No    Social History   Socioeconomic History  . Marital status: Single    Spouse name: Not on file  . Number of children: Not on file  . Years of education: Not on file  . Highest education level: Not on file  Occupational History  . Not on file  Social Needs  . Financial resource strain: Not on file  . Food insecurity:    Worry: Not on file    Inability: Not on file  . Transportation needs:    Medical: Not on file    Non-medical: Not on file  Tobacco Use  . Smoking status: Never Smoker  . Smokeless tobacco: Never Used  Substance and Sexual Activity  . Alcohol use: No  . Drug use:  No  . Sexual activity: Never  Lifestyle  . Physical activity:    Days per week: Not on file    Minutes per session: Not on file  . Stress: Not on file  Relationships  . Social connections:    Talks on phone: Not on file    Gets together: Not on file    Attends religious service: Not on file    Active member of club or organization: Not on file    Attends meetings of clubs or organizations: Not on file    Relationship status: Not on file  Other Topics Concern  . Not on file  Social History Narrative  . Not on file    Hospital Course:  From admission H&P: Renee [name] is a 23 year old female with history of depression, substance induced psychotic disorder, schizoaffective disorder, and GAD, presenting for treatment voluntarily as a walk-in. She presents as distracted and disorganized, smiling inappropriately and mumbling to self at times. TTS collected collateral information from her mother, who reported that she has not bathed for the last several days and had been staying in bed all day but not sleeping. Patient reports increased energy over the last several days and states she stayed up all night for several nights cleaning the house. Reports fatigue and states she needs medication to "chill" now. She reports marijuana use "here and there," but denies other drug/alcohol use. UDS, BAL and other labs pending. She denies AVH but does appear to  be responding to internal stimuli. She was admitted to [facility] in August 2019 for depression with psychotic features and discharged on Prozac and Abilify. She has not been taking these medications recently. She states Abilify was "nasty," and did not want to take it. Denies SI/HI. Denies pregnancy. TTS NP and counselor contacted mother for collateral information as listed below From TTS: As per mother, patient has talked to herself in the past however, she feels as though, because patient seems depressed, patient is talking to her self more often. She reports  that patient has been experiencing increased anxiety and appears to be responding to internal stimuli at times. She states," she talks to herself more and responds. She laughs out loud and in the past, she has stated that somebody else is talking to her." Mother states thatpatient has not been sleeping well for the past 3-4 days (altghough she rested better last night). She also states that prior to last night,patient would stay in the bed and for the past 3-4 days, she had not bathed.Reports patient does have anger issues although she does not belives patient is a threat to herself or others. When asked if she felt like patient was paranoid she replied, " no not at all." She reports patient was on psychotropic medications after she was discharged although she stopped taking them shortly after her discharged. Reports patient refused to go to her outpatient appointments. Mother states that patient has been experiencing severe mood swings and has been very agitated at times. Mother states that patient is more serene today and having a better day.  Renee [name] was admitted for psychotic symptoms. She was started on Seroquel but did not show improvement in psychotic symptoms. Seroquel was discontinued, and Risperdal was started. Prozac was started for depression. She received Abilify Maintena 400 mg IM on 06/16/18. Patient participated in group therapy on the unit. No disruptive or agitated behaviors on the unit. She responded well to treatment with no adverse effects reported. She remained on the [facility] unit for 7 days. She stabilized with medication and therapy. She was discharged on the medications listed below. She has shown improvement with improved mood, affect, sleep, appetite, and interaction. She no longer shows signs of responding to internal stimuli. She denies any SI/HI/AVH and contracts for safety. She agrees to follow up at [facility] (see below). Patient is provided with prescriptions for  medications upon discharge. Her mother is picking her up for discharge home.  Physical Findings: AIMS: Facial and Oral Movements Muscles of Facial Expression: None, normal Lips and Perioral Area: None, normal Jaw: None, normal Tongue: None, normal,Extremity Movements Upper (arms, wrists, hands, fingers): None, normal Lower (legs, knees, ankles, toes): None, normal, Trunk Movements Neck, shoulders, hips: None, normal, Overall Severity Severity of abnormal movements (highest score from questions above): None, normal Incapacitation due to abnormal movements: None, normal Patient's awareness of abnormal movements (rate only patient's report): No Awareness, Dental Status Current problems with teeth and/or dentures?: No Does patient usually wear dentures?: No  CIWA:    COWS:     Musculoskeletal: Strength & Muscle Tone: within normal limits Gait & Station: normal Patient leans: N/A  Psychiatric Specialty Exam: Physical Exam  Nursing note and vitals reviewed. Constitutional: She is oriented to person, place, and time. She appears well-developed and well-nourished.  Cardiovascular: Normal rate.  Respiratory: Effort normal.  Neurological: She is alert and oriented to person, place, and time.    Review of Systems  Constitutional: Negative.   Respiratory: Negative  for cough and shortness of breath.   Cardiovascular: Negative for chest pain.  Psychiatric/Behavioral: Positive for substance abuse (UDS +THC). Negative for depression, hallucinations and suicidal ideas. The patient is not nervous/anxious and does not have insomnia.     Blood pressure 110/78, pulse (!) 102, temperature 98 F (36.7 C), temperature source Oral, resp. rate 16, height  (1.575 m), weight 97.1 kg, SpO2 100 %.Body mass index is 39.14 kg/m.  See MD's discharge SRA     Have you used any form of tobacco in the last 30 days? (Cigarettes, Smokeless Tobacco, Cigars, and/or Pipes): No  Has this patient used any  form of tobacco in the last 30 days? (Cigarettes, Smokeless Tobacco, Cigars, and/or Pipes)  No  Blood Alcohol level:  Lab Results  Component Value Date   ETH <10 08/27/2017   ETH <10 08/20/2017    Metabolic Disorder Labs:  Lab Results  Component Value Date   HGBA1C 5.1 06/11/2018   MPG 99.67 06/11/2018   MPG 91.06 08/29/2017   Lab Results  Component Value Date   PROLACTIN 93.0 (H) 08/29/2017   Lab Results  Component Value Date   CHOL 158 06/11/2018   TRIG 54 06/11/2018   HDL 35 (L) 06/11/2018   CHOLHDL 4.5 06/11/2018   VLDL 11 06/11/2018   LDLCALC 112 (H) 06/11/2018   LDLCALC 135 (H) 08/29/2017    See Psychiatric Specialty Exam and Suicide Risk Assessment completed by Attending Physician prior to discharge.  Discharge destination:  Home  Is patient on multiple antipsychotic therapies at discharge:  Yes,   Do you recommend tapering to monotherapy for antipsychotics?  Yes   Has Patient had three or more failed trials of antipsychotic monotherapy by history:  No Failed Abilify, Seroquel.  Recommended Plan for Multiple Antipsychotic Therapies: Taper to monotherapy as described:  Outpatient psychiatrist may taper to antipsychotic monotherapy as patient's symptoms allow.  Discharge Instructions    Discharge instructions   Complete by:  As directed    Patient is instructed to take all prescribed medications as recommended. Report any side effects or adverse reactions to your outpatient psychiatrist. Patient is instructed to abstain from alcohol and illegal drugs while on prescription medications. In the event of worsening symptoms, patient is instructed to call the crisis hotline, 911, or go to the nearest emergency department for evaluation and treatment.     Allergies as of 06/16/2018   No Known Allergies     Medication List    TAKE these medications     Indication  albuterol 108 (90 Base) MCG/ACT inhaler Commonly known as:  ProAir HFA Inhale 2 puffs into the  lungs every 6 (six) hours. For shortness of breath  Indication:  Asthma   ARIPiprazole ER 400 MG Srer injection Commonly known as:  ABILIFY MAINTENA Inject 2 mLs (400 mg total) into the muscle every 28 (twenty-eight) days.  Indication:  Psychosis   citalopram 20 MG tablet Commonly known as:  CELEXA Take 20 mg by mouth daily.  Indication:  Generalized Anxiety Disorder   hydrOXYzine 50 MG tablet Commonly known as:  ATARAX/VISTARIL Take 1 tablet (50 mg) by mouth four times daily as needed: For anxiety/sleep  Indication:  Feeling Anxious   omega-3 acid ethyl esters 1 g capsule Commonly known as:  LOVAZA Take 1 capsule (1 g total) by mouth 2 (two) times daily.  Indication:  Neuroprotection   prenatal multivitamin Tabs tablet Take 1 tablet by mouth daily. Start taking on:  Jun 17, 2018  Indication:  Supplementation   risperidone 4 MG tablet Commonly known as:  RISPERDAL Take 1 tablet (4 mg total) by mouth at bedtime.  Indication:  Psychosis   temazepam 30 MG capsule Commonly known as:  RESTORIL Take 1 capsule (30 mg total) by mouth at bedtime.  Indication:  Trouble Sleeping      Follow-up Information    [facility]. Go on 06/24/2018.   Why:  Please attend your medication appt with Dr Jackquline BerlinIzediuno on Wednesday, 06/24/18, at 3pm. This will be an in person appointment.  Contact information: [address] [place], [place] [id number] P: [phone] F: [phone]          Follow-up recommendations: Activity as tolerated. Diet as recommended by primary care physician. Keep all scheduled follow-up appointments as recommended.   Comments:   Patient is instructed to take all prescribed medications as recommended. Report any side effects or adverse reactions to your outpatient psychiatrist. Patient is instructed to abstain from alcohol and illegal drugs while on prescription medications. In the event of worsening symptoms, patient is instructed to call the crisis hotline,  911, or go to the nearest emergency department for evaluation and treatment.  Signed: Aldean BakerJanet E Cyan Clippinger, NP 06/16/2018, 3:13 PM

## 2018-06-16 NOTE — Progress Notes (Signed)
Recreation Therapy Notes  Date: 5.19.20 Time: 0955 Location: [address] Dayroom  Group Topic: Wellness  Goal Area(s) Addresses:  Patient will define components of whole wellness. Patient will verbalize benefit of whole wellness.  Behavioral Response: None  Intervention:  Music  Activity:  Exercise.  LRT led group in a series of stretches to get loosened up.  Patients took turns leading the group in exercises of their choosing.  Patients were to get a least 30 minutes of exercise.  Patients could get water and take breaks as needed.  Education: Wellness, Building control surveyor.   Education Outcome: Acknowledges education/In group clarification offered/Needs additional education.   Clinical Observations/Feedback: Pt did not participate.  Pt sat and looked out the window.    Caroll Rancher, LRT/CTRS     Caroll Rancher A 06/16/2018 10:58 AM

## 2018-06-16 NOTE — BHH Suicide Risk Assessment (Signed)
[facility] Discharge Suicide Risk Assessment   Principal Problem: Schizoaffective disorder [facility]) Discharge Diagnoses: Principal Problem:   Schizoaffective disorder (HCC) Active Problems:   Substance-induced psychotic disorder (HCC)   MDD (major depressive disorder)   Total Time spent with patient: 45 minutes  Musculoskeletal: Strength & Muscle Tone: within normal limits Gait & Station: normal Patient leans: N/A  Psychiatric Specialty Exam: ROS  Blood pressure 110/78, pulse (!) 102, temperature 98 F (36.7 C), temperature source Oral, resp. rate 16, height 5\' 2"  (1.575 m), weight 97.1 kg, SpO2 100 %.Body mass index is 39.14 kg/m.  General Appearance: Casual  Eye Contact::  Good  Speech:  Clear and Coherent409  Volume:  Decreased  Mood:  Euthymic  Affect:  Restricted  Thought Process:  Linear and Descriptions of Associations: Circumstantial  Orientation:  Full (Time, Place, and Person)  Thought Content:  Logical and Tangential  Suicidal Thoughts:  No  Homicidal Thoughts:  No  Memory:  Immediate;   Fair  Judgement:  Fair  Insight:  Fair  Psychomotor Activity:  Normal  Concentration:  Fair  Recall:  Good  Fund of Knowledge:Good  Language: Good  Akathisia:  Negative  Handed:  Right  AIMS (if indicated):     Assets:  Communication Skills Desire for Improvement Housing Leisure Time Physical Health Resilience Social Support Transportation  Sleep:  Number of Hours: 6  Cognition: WNL  ADL's:  Intact   Mental Status Per Nursing Assessment::   On Admission:  NA  Demographic Factors:  NA  Loss Factors: NA  Historical Factors: NA  Risk Reduction Factors:   Religious beliefs about death  Continued Clinical Symptoms:  Schizophrenia:   Less than 38 years old  Cognitive Features That Contribute To Risk:  Loss of executive function    Suicide Risk:  Minimal: No identifiable suicidal ideation.  Patients presenting with no risk factors but with morbid ruminations;  may be classified as minimal risk based on the severity of the depressive symptoms  Follow-up Information    [facility]. Go on 06/24/2018.   Why:  Please attend your medication appt with Dr Jackquline Berlin on Wednesday, 06/24/18, at 3pm. This will be an in person appointment.  Contact information: [address] [place], [place] [address] P: [phone] F: [phone]          Plan Of Care/Follow-up recommendations:  Activity:  full  Celia Friedland, MD 06/16/2018, 11:51 AM

## 2018-06-16 NOTE — Progress Notes (Signed)
  [facility] Adult Case Management Discharge Plan :  Will you be returning to the same living situation after discharge:  Yes,  with mother At discharge, do you have transportation home?: Yes,  mother Do you have the ability to pay for your medications: Yes,  BCBS  Release of information consent forms completed and in the chart;  Patient's signature needed at discharge.  Patient to Follow up at: Follow-up Information    [facility]. Go on 06/24/2018.   Why:  Please attend your medication appt with Dr Jackquline Berlin on Wednesday, 06/24/18, at 3pm. This will be an in person appointment.  Contact information: [address] [place], [place] [address] P: [phone] F: [phone]          Next level of care provider has access to [facility] Link:no  Safety Planning and Suicide Prevention discussed: Yes,  with mother  Have you used any form of tobacco in the last 30 days? (Cigarettes, Smokeless Tobacco, Cigars, and/or Pipes): No  Has patient been referred to the Quitline?: N/A patient is not a smoker  Patient has been referred for addiction treatment: Pt. refused referral  Lorri Frederick, LCSW 06/16/2018, 10:41 AM

## 2018-09-24 ENCOUNTER — Other Ambulatory Visit: Payer: Self-pay

## 2018-09-24 ENCOUNTER — Ambulatory Visit (INDEPENDENT_AMBULATORY_CARE_PROVIDER_SITE_OTHER): Payer: Self-pay | Admitting: Family Medicine

## 2018-09-24 VITALS — BP 100/60 | HR 110

## 2018-09-24 DIAGNOSIS — N898 Other specified noninflammatory disorders of vagina: Secondary | ICD-10-CM | POA: Insufficient documentation

## 2018-09-24 LAB — POCT WET PREP (WET MOUNT)
Clue Cells Wet Prep Whiff POC: NEGATIVE
Trichomonas Wet Prep HPF POC: ABSENT

## 2018-09-24 NOTE — Assessment & Plan Note (Signed)
One week of brownish discharge with 'tangy' odor. 10 days overdue for her period.  Blood seen surrounding os on exam. No organisms seen on wt prep.  Last sexual encounter was march, with multiple menstrual cycles since.  Likely this is vaginal bleeding from a light menstrual cycle.  Advised pt to make followup appointment if bleeding continues past another week.

## 2018-09-24 NOTE — Progress Notes (Signed)
   [facility] Phone: [phone]     Renee [name] - 23 y.o. female MRN [id number]  Date of birth: 1995/02/19  Subjective:   cc: vaginal discharge.   HPI:  One week of brownish discharge from her vagina.  There is a 'tangy' odor to it.  No bleeding or vaginal prutitus or pain.  No abdominal pain.  Has not had this issue before . No fevers.  Has not been sexually active since march. Has had multiple periods between her last sexual encounter and now.  She used a condom.  Her last menstrual cycle was due on the 17th of this month, ten days ago.     ROS: See HPI for pertinent positives and negatives  Past Medical History  Family history reviewed for today's visit. No changes.  Social history- patient is a *non smoker  Health Maintenance:  -  Health Maintenance Due  Topic Date Due  . HIV Screening  05/02/2012  . TETANUS/TDAP  05/02/2016  . PAP-Cervical Cytology Screening  05/03/2018  . PAP SMEAR-Modifier  05/03/2018  . INFLUENZA VACCINE  08/29/2018   Objective:   BP 100/60   Pulse (!) 110   SpO2 99%  Gen: NAD, alert and oriented, cooperative with exam GU: some blood coming from near the cervical os noted. No lesions.   Psych: Appropriate behavior  Assessment/Plan:   Vaginal discharge One week of brownish discharge with 'tangy' odor. 10 days overdue for her period.  Blood seen surrounding os on exam. No organisms seen on wt prep.  Last sexual encounter was march, with multiple menstrual cycles since.  Likely this is vaginal bleeding from a light menstrual cycle.  Advised pt to make followup appointment if bleeding continues past another week.     Orders Placed This Encounter  Procedures  . POCT Wet Prep [facility])    Clemetine Marker, MD PGY-2 [facility]

## 2018-09-24 NOTE — Patient Instructions (Signed)
Your wet prep was negative.  The brownish discharge is most likely just light bleeding given that it is occurring around the same time you normally get your menstrual cycle.  I would not be concerned by this.  Some months are lighter than others.  It should go back to normal in the next month or 2 during her next cycle.  If you continue to have this spotting/discharge for the next month every day or every other day that be reason to come back and see [place] for further evaluation.  Otherwise we will just see you as needed.  Clemetine Marker, MD

## 2019-04-05 ENCOUNTER — Other Ambulatory Visit: Payer: Self-pay

## 2019-04-05 ENCOUNTER — Telehealth (INDEPENDENT_AMBULATORY_CARE_PROVIDER_SITE_OTHER): Payer: Self-pay | Admitting: Family Medicine

## 2019-04-05 DIAGNOSIS — J301 Allergic rhinitis due to pollen: Secondary | ICD-10-CM

## 2019-04-05 MED ORDER — FLUTICASONE PROPIONATE 50 MCG/ACT NA SUSP: 2.0000 | Freq: Every day | NASAL | 6 refills | Status: AC

## 2019-04-05 MED ORDER — CETIRIZINE HCL 10 MG PO TABS: 10.0000 mg | ORAL_TABLET | Freq: Every day | ORAL | 11 refills | Status: DC | PRN

## 2019-04-05 MED ORDER — AZELASTINE HCL 0.1 % NA SOLN: 2.0000 | Freq: Two times a day (BID) | NASAL | 12 refills | Status: AC

## 2019-04-05 NOTE — Assessment & Plan Note (Signed)
Seasonal allergies not completely controlled on nasal steroid. -Continue Flonase -Add Astelin and Zyrtec -Discontinue Benadryl  -We will up in person in 1 week if no improvement

## 2019-04-05 NOTE — Patient Instructions (Signed)
-  Continue Flonase -Add Astelin and Zyrtec -Discontinue Benadryl

## 2019-04-05 NOTE — Progress Notes (Signed)
[facility] Telemedicine Visit  Patient consented to have virtual visit. Method of visit: Video was attempted, but technology challenges prevented patient from using video, so visit was conducted via telephone.  Encounter participants: Patient: Renee [name] - located at home Provider: Garnette Gunner - located at office Others (if applicable): Mother  Chief Complaint: Congestion  HPI:  Patient has history of seasonal allergy congestion.  It started back up with the spring season.  Ports about 1 week of nasal congestion, itchy eyes, and sneezing.  Denies any cough, fevers, chills, shortness of breath, sinus pain, headache.  Patient is trialed an OTC nasal steroid.  She is using it 2 squirts per nare per day.  She said minimal improvement with this.  Patient is also been taking Benadryl and Mucinex.  ROS: per HPI  Pertinent PMHx: Seasonal allergies  Exam:  Respiratory: Able speak in full sentence without issue  Assessment/Plan:  Seasonal allergic rhinitis due to pollen Seasonal allergies not completely controlled on nasal steroid. -Continue Flonase -Add Astelin and Zyrtec -Discontinue Benadryl  -We will up in person in 1 week if no improvement    Time spent during visit with patient: 10 minutes

## 2020-01-07 IMAGING — CT CT ABD-PELV W/ CM
2 of 4 series · 16 of 46 positions shown, 18 images · IV contrast (ISOVUE)
Comparison: None.

CLINICAL DATA: Right-sided abdominal pain after injecting soap into
her abdomen.

EXAM:
CT ABDOMEN AND PELVIS WITH CONTRAST
TECHNIQUE: Multidetector CT imaging of the abdomen and pelvis was performed
using the standard protocol following bolus administration of
intravenous contrast.
CONTRAST:  100mL 9JF19R-FLL IOPAMIDOL (9JF19R-FLL) INJECTION 61%

[Series 2: axial st · axial · 0.80mm/px · z∈[-503,-63]mm · 13 of 100 slices shown, 15 images]
[im 6/100  soft-tissue]
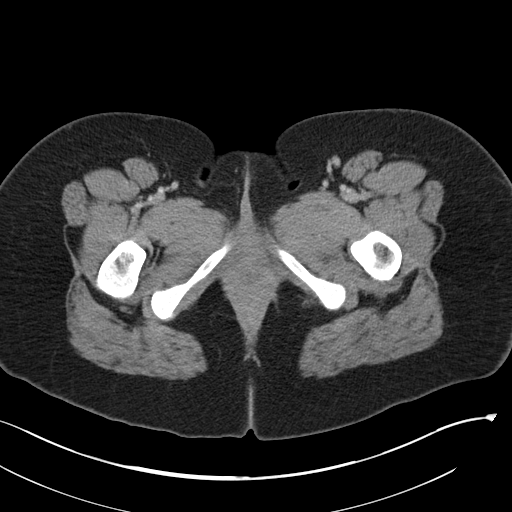
[im 6/100  bone]
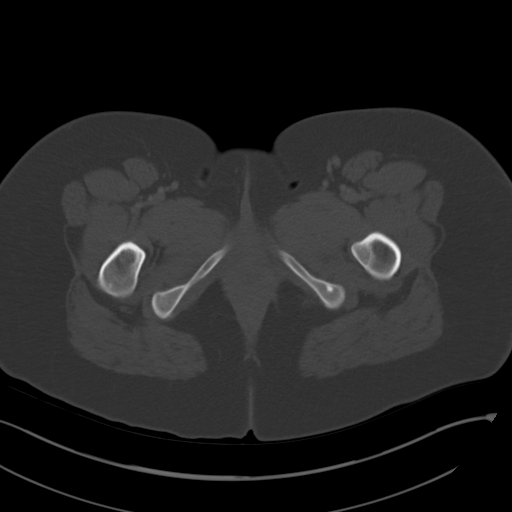
[im 12/100  soft-tissue]
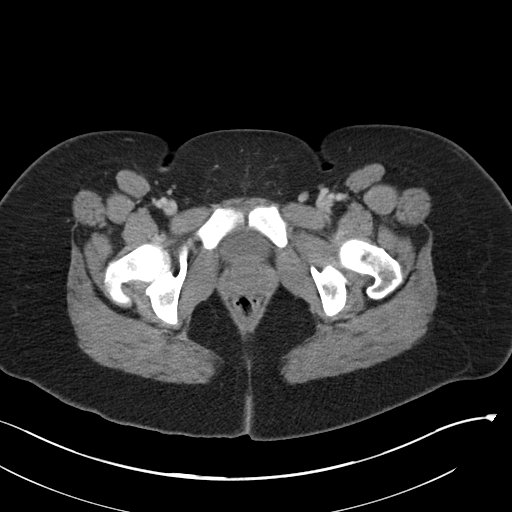
[im 24/100  soft-tissue]
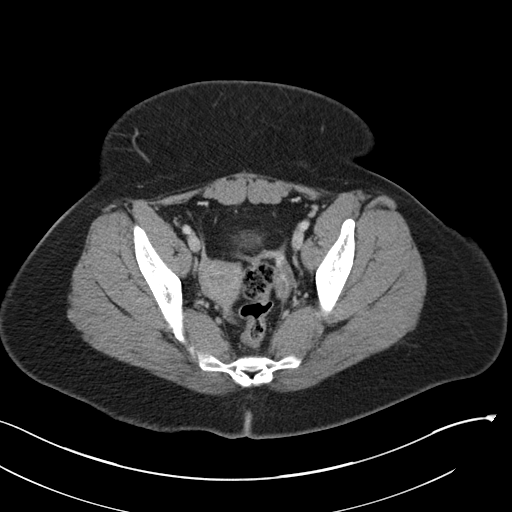
[im 30/100  soft-tissue]
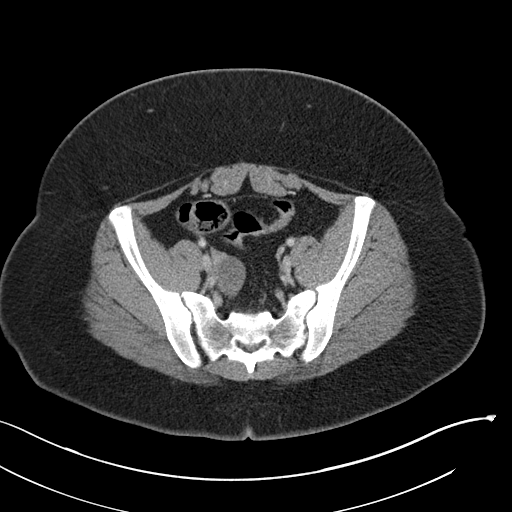
[im 35/100  soft-tissue]
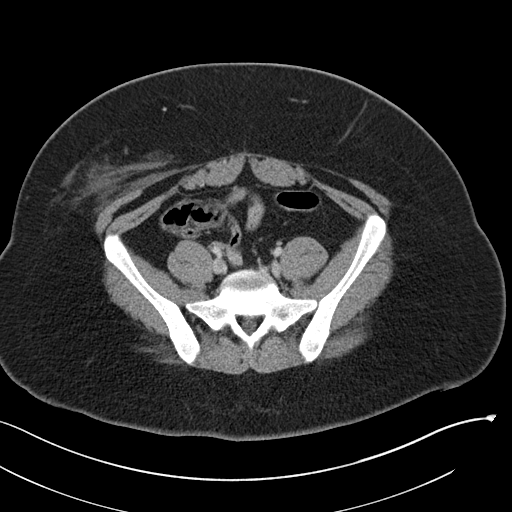
[im 41/100  soft-tissue]
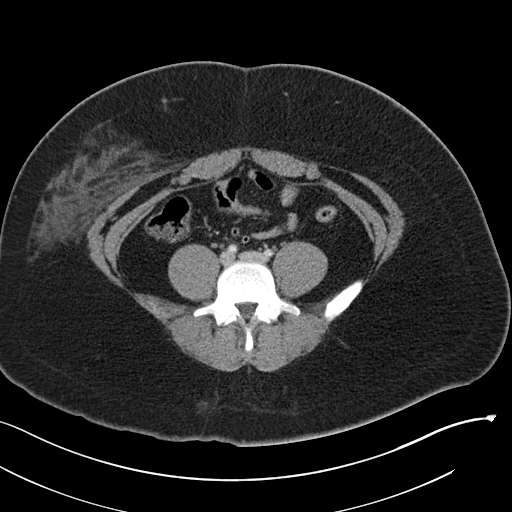
[im 53/100  soft-tissue]
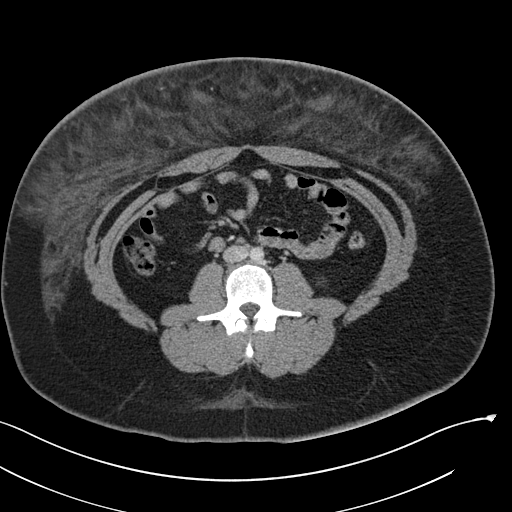
[im 59/100  soft-tissue]
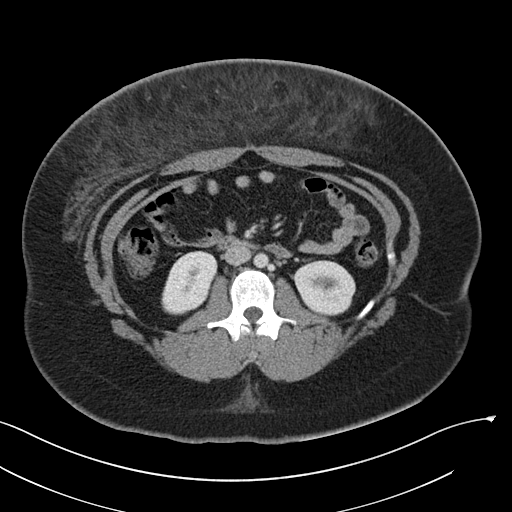
[im 65/100  soft-tissue]
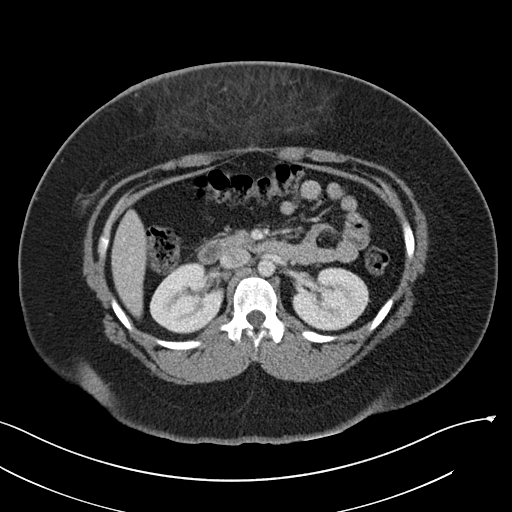
[im 65/100  bone]
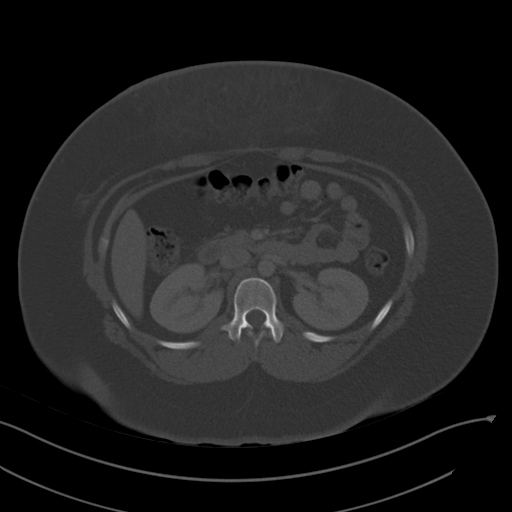
[im 70/100  soft-tissue]
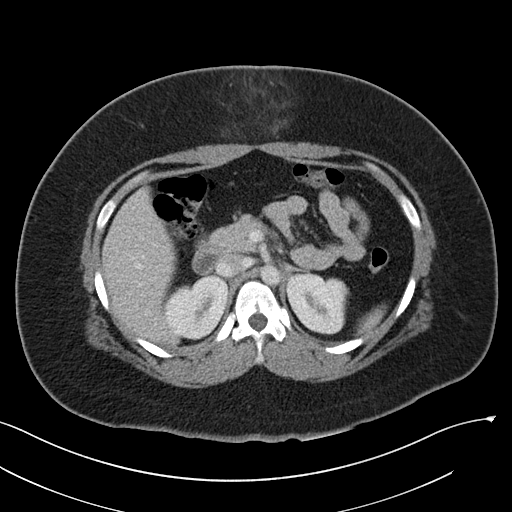
[im 76/100  soft-tissue]
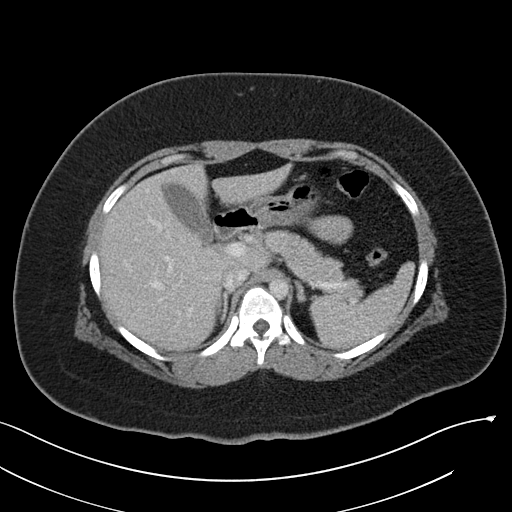
[im 88/100  soft-tissue]
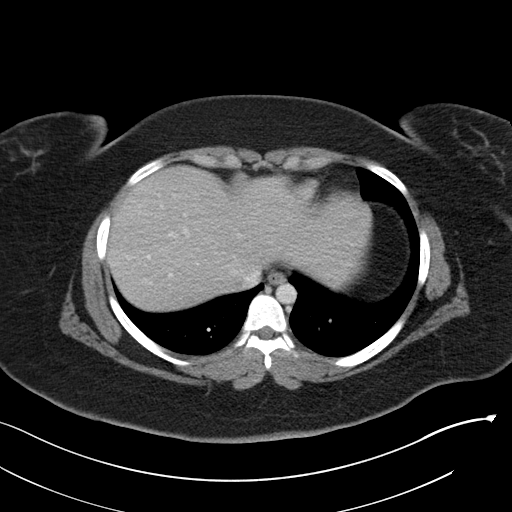
[im 94/100  soft-tissue]
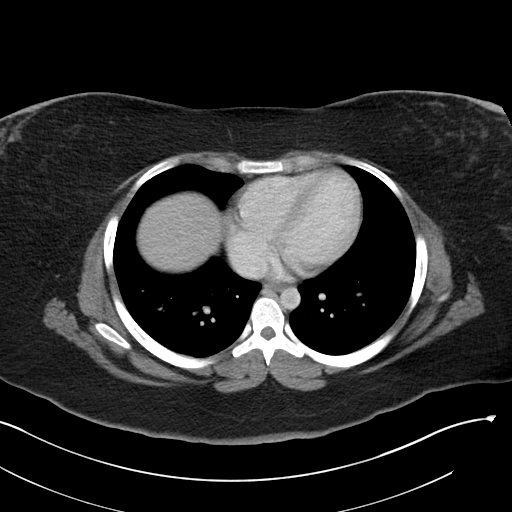

[Series 4: coronal st · coronal · 0.90mm/px · 3 of 108 slices shown]
[im 36/108  soft-tissue]
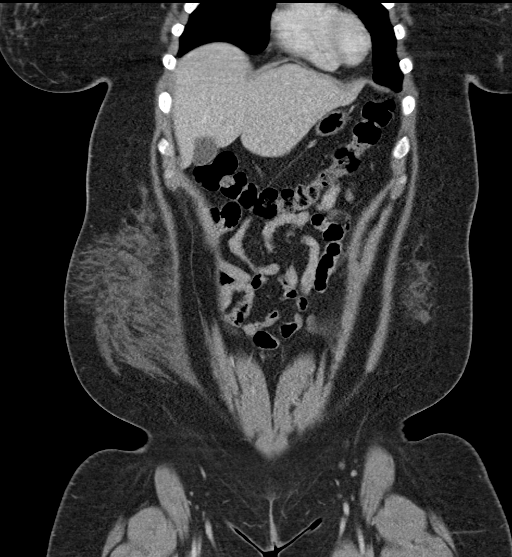
[im 48/108  soft-tissue]
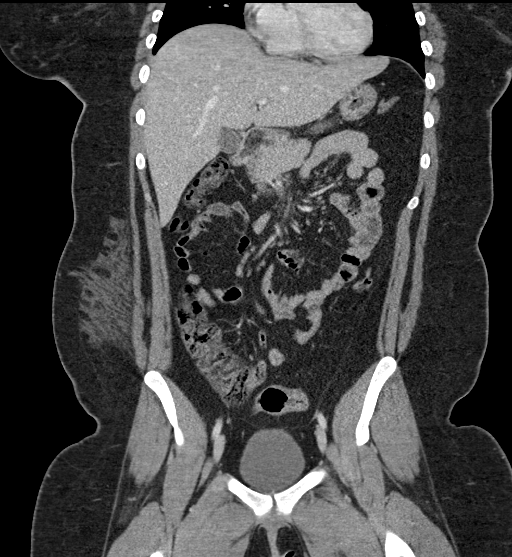
[im 60/108  soft-tissue]
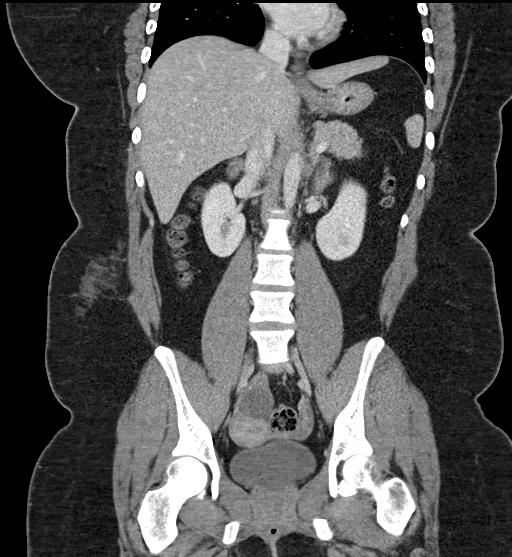

[16 of 46 positions shown; findings below may reference images not displayed]

FINDINGS: Lower chest: Lung bases are clear. Heart size normal. No pericardial
or pleural effusion. Distal esophagus is grossly unremarkable.

Hepatobiliary: Liver and gallbladder are unremarkable. No biliary
ductal dilatation.

Pancreas: Negative.

Spleen: Negative.

Adrenals/Urinary Tract: Adrenal glands and right kidney are
unremarkable. Somewhat ill-defined low-attenuation lesion in the
lower pole left kidney measures 1.4 cm and is difficult to further
characterize due to size. Ureters are decompressed. Bladder is
unremarkable.

Stomach/Bowel: Stomach, small bowel, appendix and colon are
unremarkable.

Vascular/Lymphatic: Circumaortic left renal vein. Vascular
structures are otherwise unremarkable. No pathologically enlarged
lymph nodes.

Reproductive: Uterus is visualized. 3.1 cm low-attenuation lesion in
the right ovary is likely a benign physiologic cyst.

Other: Small pelvic free fluid, likely physiologic. Mesenteries and
peritoneum are unremarkable.

Musculoskeletal: Fairly diffuse ventral abdominal and right pelvic
wall edema with locules of subcutaneous air. No organized fluid
collection. Osseous structures are unremarkable.
IMPRESSION: Fairly diffuse ventral abdominal and right pelvic wall edema with
locules of subcutaneous air, findings consistent with the provided
history of subcutaneous injections. No organized fluid collection.
No intra-abdominal acute findings.

## 2020-01-13 IMAGING — DX DG CHEST 1V PORT
1 series · 1 of 1 positions shown · non-contrast
Comparison: 02/11/2018

CLINICAL DATA: Shortness of Breath

EXAM:
PORTABLE CHEST 1 VIEW

[chest ap]
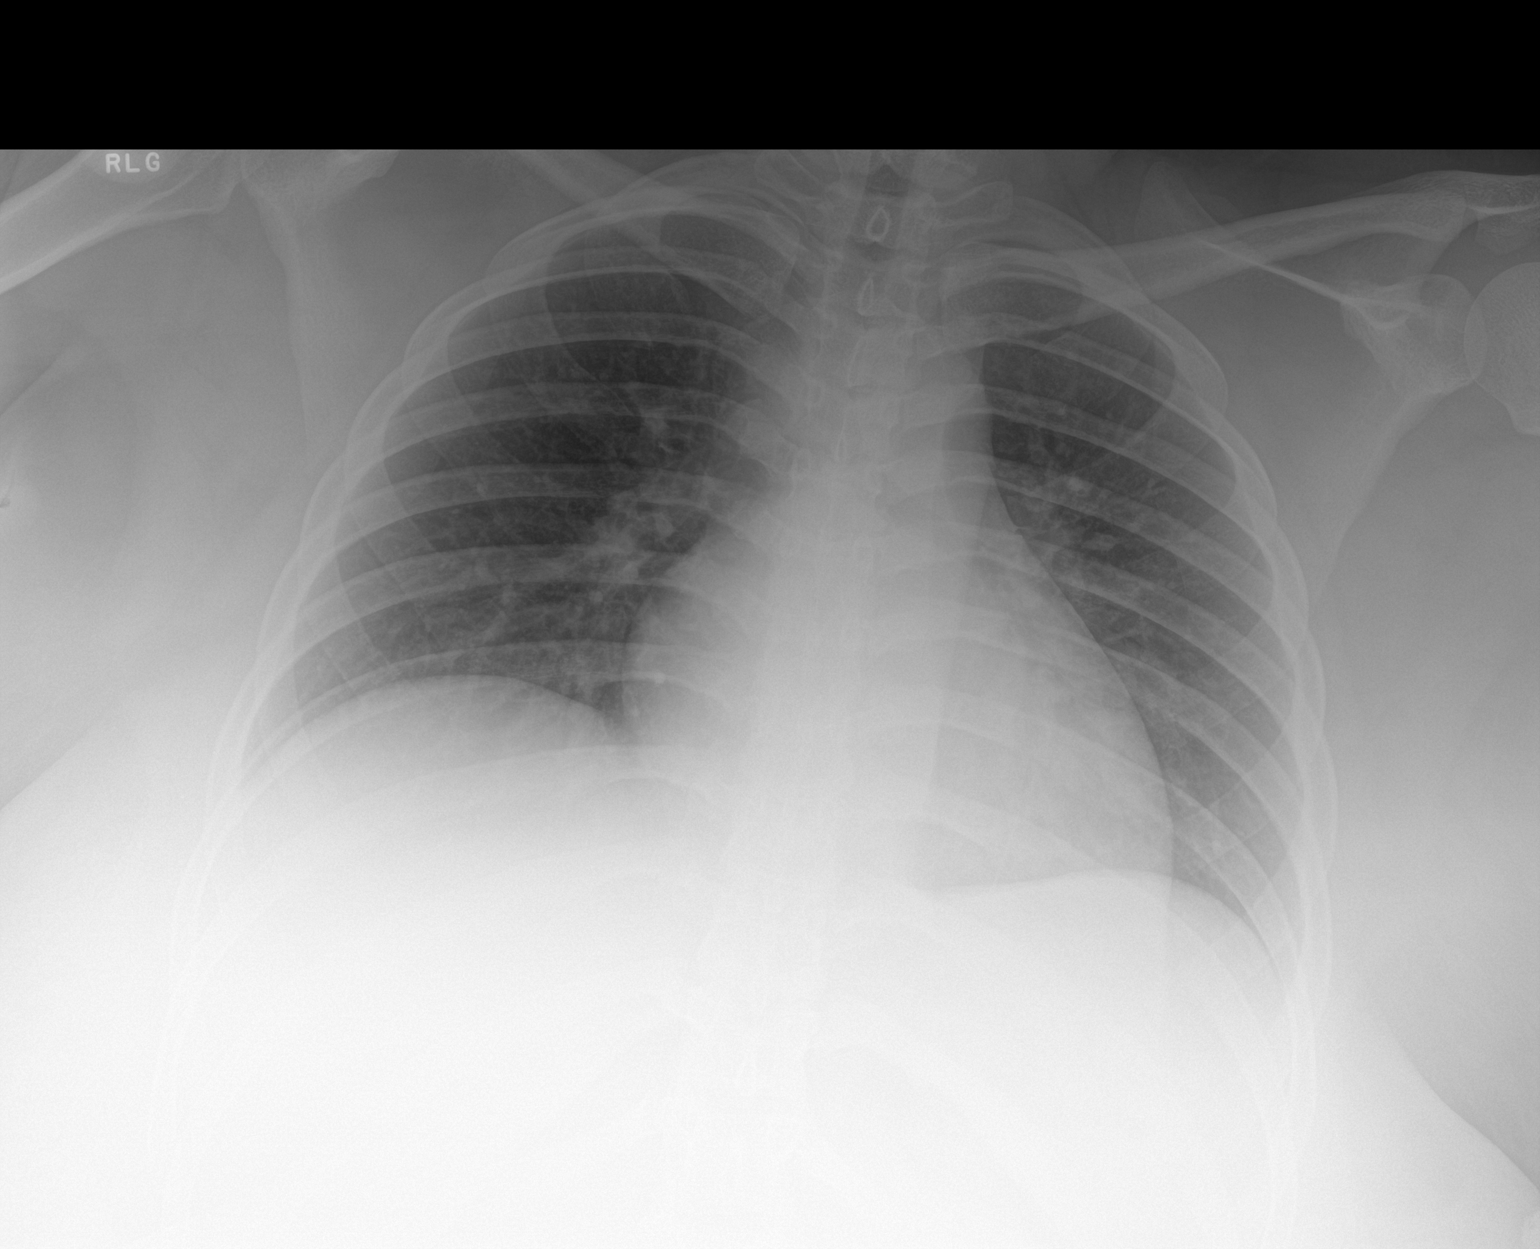

[1 of 1 positions shown; findings below may reference images not displayed]

FINDINGS: The heart size and mediastinal contours are within normal limits.
Both lungs are clear. The visualized skeletal structures are
unremarkable.
IMPRESSION: No active disease.

## 2020-05-10 ENCOUNTER — Ambulatory Visit (INDEPENDENT_AMBULATORY_CARE_PROVIDER_SITE_OTHER): Payer: Self-pay | Admitting: Family Medicine

## 2020-05-10 VITALS — BP 110/80 | HR 100 | Temp 98.5°F

## 2020-05-10 DIAGNOSIS — J4520 Mild intermittent asthma, uncomplicated: Secondary | ICD-10-CM

## 2020-05-10 DIAGNOSIS — J301 Allergic rhinitis due to pollen: Secondary | ICD-10-CM

## 2020-05-10 DIAGNOSIS — J029 Acute pharyngitis, unspecified: Secondary | ICD-10-CM

## 2020-05-10 LAB — POCT RAPID STREP A (OFFICE): Rapid Strep A Screen: NEGATIVE

## 2020-05-10 MED ORDER — PREDNISONE 20 MG PO TABS: 20.0000 mg | ORAL_TABLET | Freq: Every day | ORAL | 0 refills | Status: AC

## 2020-05-10 MED ORDER — ALBUTEROL SULFATE HFA 108 (90 BASE) MCG/ACT IN AERS: 2.0000 | INHALATION_SPRAY | Freq: Four times a day (QID) | RESPIRATORY_TRACT | 3 refills | Status: AC

## 2020-05-10 MED ORDER — FLOVENT HFA 110 MCG/ACT IN AERO: 1.0000 | INHALATION_SPRAY | Freq: Every day | RESPIRATORY_TRACT | 12 refills | Status: AC

## 2020-05-10 MED ORDER — LEVOCETIRIZINE DIHYDROCHLORIDE 5 MG PO TABS: 5.0000 mg | ORAL_TABLET | Freq: Every evening | ORAL | 3 refills | Status: DC

## 2020-05-10 NOTE — Progress Notes (Signed)
    SUBJECTIVE:   CHIEF COMPLAINT / HPI:   Cough, stuffy nose, chest tightness: started April 5th she started having some sneezing, "coughing up yellow stuff", some shortness of breath/chest tightness (has had before with allergies, has hx of asthma), runny nose with yellow mucus, stuffy nose, and sore throat. She denies itchy eyes, watery eyes, fevers, rashes, nausea, vomiting, and upset stomach. She has taken Zyrtec in the past but most recently has been on Allegra-D. She no longer has her albuterol inhaler. Has been having these symptoms for about 1 week.   PERTINENT  PMH / PSH:  Patient Active Problem List   Diagnosis Date Noted  . Seasonal allergic rhinitis due to pollen 04/05/2019  . Vaginal discharge 09/24/2018  . MDD (major depressive disorder) 06/09/2018  . Iron deficiency anemia 03/03/2018  . Tachycardia 02/14/2018  . Leukocytosis 02/14/2018  . Sepsis (HCC) 02/14/2018  . Obesity (BMI 30-39.9) 02/14/2018  . Generalized abdominal wall pain 02/14/2018  . Abdominal wall cellulitis 02/13/2018  . Fever 02/13/2018  . Gastroenteritis 02/09/2018  . 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 Marijuana abuse 08/29/2017  . 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 Involuntary commitment   . Schizoaffective disorder (HCC)   . Depression 08/21/2017  . Severe episode of recurrent major depressive disorder, without psychotic features (HCC) 08/20/2017  . Asthma with exacerbation 04/26/2014  . Heart palpitations 12/15/2013  . Blurred vision, bilateral 12/15/2013  . Decreased visual acuity 12/15/2013  . Generalized anxiety disorder 12/15/2013  . Tension headache 12/15/2013  . Encounter for sexually transmitted disease counseling 12/29/2012  . General counseling and advice on female contraception 12/29/2012  . Paresthesias 08/05/2012  . Tongue lesion 08/05/2012  . Asthma 06/22/2008  . CHILDHOOD OBESITY 08/27/2007  . H/O Kawasaki's  disease 05/28/2007  . ALLERGIC RHINITIS 06/20/2006    OBJECTIVE:   BP 110/80   Pulse 100   Temp 98.5 F (36.9 C)   LMP 04/11/2020   SpO2 (!) 88%    Physical exam: General: well appearing, pleasant EENT: EOMI, PERRLA, no conjunctival induration appreciated, normal-appearing TMs with normal landmarks and cone of light appreciated bilaterally, patent nares with edematous nasal turbinates bilaterally, clear rhinorrhea appreciated; 2-3 questionable exudates appreciated to bilateral tonsils, however view somewhat limited Respiratory: Distant breath sounds possibly secondary to patient's body habitus, no wheezes appreciated Cardio: RRR, S1-S2 present, no murmurs  ASSESSMENT/PLAN:   Asthma Patient with chest tightness, history of asthma.  Distant breath sounds appreciated on physical exam, no wheezes appreciated.  Patient not currently taking asthma medication. -Take Flovent 1 puff once daily even if not short of breath.  -Albuterol 2-4 puffs every 4-6 hours as needed for shortness of breath -Stop Zyrtec and Allegra;  take Xyzal 5mg  daily.  -Prescription for prednisone 20 mg x 5 days sent to patient's pharmacy in case she develops worsening shortness of breath or chest tightness over the weekend -Strict return precautions provided  Seasonal allergic rhinitis due to pollen -Stop Zyrtec, Allegra; start Xyzal 5 mg daily -Patient prescribed Flovent once daily and albuterol 2-4 puffs every 4-6 hours as needed for shortness of breath -Recommend patient continue Flonase to each nostril to help decrease symptoms     , DO [facility]

## 2020-05-10 NOTE — Patient Instructions (Signed)
Thank you for coming in to see [place] today! Please see below to review our plan for today's visit:  1. Take Flovent 1 puff once daily even if you are not short of breath. This prevents you from having shortness of breath and helps prevent asthma exacerbations. I have re-prescribed Albuterol which you can use 2-4 puffs every 4-6 hours as needed for shortness of breath.  2. Stop Zyrtec and Allegra, and instead take Xyzal 5mg  daily.  3. I have sent a prescription for a steroid to your pharmacy in case you get worse over the weekend or develop significant shortness of breath or chest tightness. Try your best not to use it if you don't need to!  Please call the clinic at [phone] if your symptoms worsen or you have any concerns. It was our pleasure to serve you!   Dr. [phone] [facility]

## 2020-05-13 NOTE — Assessment & Plan Note (Addendum)
Patient with chest tightness, history of asthma.  Distant breath sounds appreciated on physical exam, no wheezes appreciated.  Patient not currently taking asthma medication. -Take Flovent 1 puff once daily even if not short of breath.  -Albuterol 2-4 puffs every 4-6 hours as needed for shortness of breath -Stop Zyrtec and Allegra;  take Xyzal 5mg  daily.  -Prescription for prednisone 20 mg x 5 days sent to patient's pharmacy in case she develops worsening shortness of breath or chest tightness over the weekend -Strict return precautions provided

## 2020-05-13 NOTE — Assessment & Plan Note (Signed)
-  Stop Zyrtec, Allegra; start Xyzal 5 mg daily -Patient prescribed Flovent once daily and albuterol 2-4 puffs every 4-6 hours as needed for shortness of breath -Recommend patient continue Flonase to each nostril to help decrease symptoms

## 2020-06-02 ENCOUNTER — Ambulatory Visit (INDEPENDENT_AMBULATORY_CARE_PROVIDER_SITE_OTHER): Payer: Self-pay | Admitting: Family Medicine

## 2020-06-02 ENCOUNTER — Other Ambulatory Visit: Payer: Self-pay

## 2020-06-02 ENCOUNTER — Other Ambulatory Visit (HOSPITAL_COMMUNITY)
Admission: RE | Admit: 2020-06-02 | Discharge: 2020-06-02 | Disposition: A | Discharge status: Home / Self Care | Payer: Medicaid Other | Source: Ambulatory Visit | Attending: Family Medicine | Admitting: Family Medicine

## 2020-06-02 VITALS — BP 111/83 | HR 96 | Ht 62.0 in | Wt 242.0 lb

## 2020-06-02 DIAGNOSIS — N898 Other specified noninflammatory disorders of vagina: Secondary | ICD-10-CM

## 2020-06-02 LAB — POCT WET PREP (WET MOUNT)
Clue Cells Wet Prep Whiff POC: NEGATIVE
Trichomonas Wet Prep HPF POC: ABSENT

## 2020-06-02 NOTE — Progress Notes (Signed)
    SUBJECTIVE:   CHIEF COMPLAINT / HPI:   Chief Complaint  Patient presents with  . Vaginal Discharge    Renee [name] is a 25 y.o. female presents for vaginal discharge.   Vaginal Discharge Having vaginal discharge for 1 week with fishy odor. She used metro gel for 5 days prescribed by her GYN which helped. She took Diflucan in Feb for a yeast infection.  Discharge consistency: thin Discharge color: brown - Denies itching, burning, abdominal pain, dysuria or hematuria, nausea or vomiting, fevers or pelvic pain. - Patient reports no STIs in the past.  - Sexully active but no new partners.  - Patient's last menstrual period was 05/13/2020 (exact date).   - Reports douching in March.  -Contraception: condoms    PERTINENT  PMH / PSH: reviewed and updated as appropriate   OBJECTIVE:   BP 111/83   Pulse 96   Ht 5\' 2"  (1.575 m)   Wt 242 lb (109.8 kg)   LMP 05/13/2020 (Exact Date)   BMI 44.26 kg/m   GEN: well appearing female in no acute distress  CVS: well perfused  RESP: speaking in full sentences without pause  ABD: soft, non-tender, non-distended, no palpable masses  Pelvic exam: normal external genitalia, vulva, VAGINA and CERVIX: normal appearing cervix without discharge or lesions, ADNEXA: normal adnexa in size, nontender and no masses, WET MOUNT done - results: negative for pathogens, normal epithelial cells, exam chaperoned by CMA.     ASSESSMENT/PLAN:   Vaginal odor Patient reports fishy odor with brown discharge for the past week. Wet prep unremarkable. Discussed avoidance of douching. No yeast, BV or trich identified. GC/CT collected. HIV / RPR offered however pt declined. Advised starting prenatal vitamins given intermittent use of contraception.        05/15/2020, DO [facility]

## 2020-06-03 NOTE — Assessment & Plan Note (Signed)
Patient reports fishy odor with brown discharge for the past week. Wet prep unremarkable. Discussed avoidance of douching. No yeast, BV or trich identified. GC/CT collected. HIV / RPR offered however pt declined. Advised starting prenatal vitamins given intermittent use of contraception.

## 2020-06-05 LAB — CERVICOVAGINAL ANCILLARY ONLY
Chlamydia: NEGATIVE
Comment: NEGATIVE
Comment: NORMAL
Neisseria Gonorrhea: NEGATIVE

## 2020-08-07 ENCOUNTER — Other Ambulatory Visit: Payer: Self-pay | Admitting: Family Medicine

## 2020-08-07 DIAGNOSIS — J301 Allergic rhinitis due to pollen: Secondary | ICD-10-CM

## 2020-08-30 ENCOUNTER — Ambulatory Visit: Payer: Medicaid Other

## 2020-09-29 ENCOUNTER — Ambulatory Visit: Payer: BC Managed Care – PPO | Admitting: Family Medicine
